# Patient Record
Sex: Female | Born: 1960 | Race: White | Hispanic: No | Smoking: Former smoker
Health system: Southern US, Community
[De-identification: ages and names within clinical notes are randomized; demographics above are authoritative.]

## PROBLEM LIST (undated history)

## (undated) DIAGNOSIS — R002 Palpitations: Secondary | ICD-10-CM

## (undated) DIAGNOSIS — M2550 Pain in unspecified joint: Secondary | ICD-10-CM

## (undated) DIAGNOSIS — M1990 Unspecified osteoarthritis, unspecified site: Secondary | ICD-10-CM

## (undated) DIAGNOSIS — K219 Gastro-esophageal reflux disease without esophagitis: Secondary | ICD-10-CM

## (undated) DIAGNOSIS — D649 Anemia, unspecified: Secondary | ICD-10-CM

## (undated) DIAGNOSIS — R42 Dizziness and giddiness: Secondary | ICD-10-CM

## (undated) DIAGNOSIS — F988 Other specified behavioral and emotional disorders with onset usually occurring in childhood and adolescence: Secondary | ICD-10-CM

## (undated) DIAGNOSIS — F32A Depression, unspecified: Secondary | ICD-10-CM

## (undated) DIAGNOSIS — F329 Major depressive disorder, single episode, unspecified: Secondary | ICD-10-CM

## (undated) DIAGNOSIS — G4730 Sleep apnea, unspecified: Secondary | ICD-10-CM

## (undated) DIAGNOSIS — M549 Dorsalgia, unspecified: Secondary | ICD-10-CM

## (undated) DIAGNOSIS — R0602 Shortness of breath: Secondary | ICD-10-CM

## (undated) DIAGNOSIS — K5900 Constipation, unspecified: Secondary | ICD-10-CM

## (undated) DIAGNOSIS — F419 Anxiety disorder, unspecified: Secondary | ICD-10-CM

## (undated) DIAGNOSIS — I10 Essential (primary) hypertension: Secondary | ICD-10-CM

## (undated) DIAGNOSIS — E119 Type 2 diabetes mellitus without complications: Secondary | ICD-10-CM

## (undated) DIAGNOSIS — R079 Chest pain, unspecified: Secondary | ICD-10-CM

## (undated) DIAGNOSIS — M7989 Other specified soft tissue disorders: Secondary | ICD-10-CM

## (undated) DIAGNOSIS — E785 Hyperlipidemia, unspecified: Secondary | ICD-10-CM

## (undated) DIAGNOSIS — I509 Heart failure, unspecified: Secondary | ICD-10-CM

## (undated) DIAGNOSIS — M797 Fibromyalgia: Secondary | ICD-10-CM

## (undated) HISTORY — DX: Gastro-esophageal reflux disease without esophagitis: K21.9

## (undated) HISTORY — DX: Shortness of breath: R06.02

## (undated) HISTORY — DX: Fibromyalgia: M79.7

## (undated) HISTORY — DX: Essential (primary) hypertension: I10

## (undated) HISTORY — DX: Unspecified osteoarthritis, unspecified site: M19.90

## (undated) HISTORY — DX: Palpitations: R00.2

## (undated) HISTORY — DX: Chest pain, unspecified: R07.9

## (undated) HISTORY — DX: Pain in unspecified joint: M25.50

## (undated) HISTORY — DX: Other specified soft tissue disorders: M79.89

## (undated) HISTORY — DX: Dizziness and giddiness: R42

## (undated) HISTORY — DX: Constipation, unspecified: K59.00

## (undated) HISTORY — DX: Anemia, unspecified: D64.9

## (undated) HISTORY — DX: Anxiety disorder, unspecified: F41.9

## (undated) HISTORY — DX: Other specified behavioral and emotional disorders with onset usually occurring in childhood and adolescence: F98.8

## (undated) HISTORY — DX: Depression, unspecified: F32.A

## (undated) HISTORY — DX: Dorsalgia, unspecified: M54.9

## (undated) HISTORY — DX: Sleep apnea, unspecified: G47.30

## (undated) HISTORY — DX: Type 2 diabetes mellitus without complications: E11.9

## (undated) HISTORY — DX: Hyperlipidemia, unspecified: E78.5

## (undated) HISTORY — PX: OTHER SURGICAL HISTORY: SHX169

---
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
## 2004-07-21 HISTORY — PX: ESOPHAGOGASTRODUODENOSCOPY: SHX1529

## 2011-11-04 HISTORY — PX: COLONOSCOPY: SHX174

## 2013-08-28 DIAGNOSIS — E119 Type 2 diabetes mellitus without complications: Secondary | ICD-10-CM | POA: Insufficient documentation

## 2014-07-12 HISTORY — PX: COLONOSCOPY: SHX174

## 2019-07-06 ENCOUNTER — Other Ambulatory Visit (HOSPITAL_COMMUNITY): Payer: Self-pay | Admitting: Sports Medicine

## 2019-07-06 DIAGNOSIS — Z1231 Encounter for screening mammogram for malignant neoplasm of breast: Secondary | ICD-10-CM

## 2019-07-20 ENCOUNTER — Ambulatory Visit (HOSPITAL_COMMUNITY)
Admission: RE | Admit: 2019-07-20 | Discharge: 2019-07-20 | Disposition: A | Discharge status: Home / Self Care | Payer: Self-pay | Source: Ambulatory Visit | Attending: Sports Medicine | Admitting: Sports Medicine

## 2019-07-20 ENCOUNTER — Other Ambulatory Visit (HOSPITAL_COMMUNITY): Payer: Self-pay | Admitting: Sports Medicine

## 2019-07-20 ENCOUNTER — Other Ambulatory Visit: Payer: Self-pay

## 2019-07-20 DIAGNOSIS — Z1231 Encounter for screening mammogram for malignant neoplasm of breast: Secondary | ICD-10-CM

## 2019-10-04 ENCOUNTER — Encounter: Payer: Self-pay | Admitting: *Deleted

## 2019-10-05 ENCOUNTER — Other Ambulatory Visit: Payer: Self-pay

## 2019-10-05 ENCOUNTER — Ambulatory Visit (INDEPENDENT_AMBULATORY_CARE_PROVIDER_SITE_OTHER): Payer: Self-pay | Admitting: Cardiology

## 2019-10-05 ENCOUNTER — Encounter: Payer: Self-pay | Admitting: Cardiology

## 2019-10-05 ENCOUNTER — Encounter (INDEPENDENT_AMBULATORY_CARE_PROVIDER_SITE_OTHER): Payer: Self-pay

## 2019-10-05 VITALS — BP 118/76 | HR 63 | Ht 61.0 in | Wt 206.6 lb

## 2019-10-05 DIAGNOSIS — R002 Palpitations: Secondary | ICD-10-CM

## 2019-10-05 NOTE — Patient Instructions (Signed)
Medication Instructions:   Your physician recommends that you continue on your current medications as directed. Please refer to the Current Medication list given to you today.  Labwork:  NONE  Testing/Procedures: Your physician has recommended that you wear an event monitor for 7 days. Event monitors are medical devices that record the heart's electrical activity. Doctors most often [place] these monitors to diagnose arrhythmias. Arrhythmias are problems with the speed or rhythm of the heartbeat. The monitor is a small, portable device. You can wear one while you do your normal daily activities. This is usually used to diagnose what is causing palpitations/syncope (passing out). [employer] will send this monitor to your house.  Follow-Up:  Your physician recommends that you schedule a follow-up appointment in: pending.  Any Other Special Instructions Will Be Listed Below (If Applicable).  If you need a refill on your cardiac medications before your next appointment, please call your pharmacy.

## 2019-10-05 NOTE — Progress Notes (Signed)
Cardiology Office Note  Date: 10/05/2019   ID: Sarah Porter, DOB 09/19/1960, MRN [id number]  PCP:  [facility]  Consulting Cardiologist:  [name], MD Electrophysiologist:  None   Chief Complaint  Patient presents with  . Palpitations    History of Present Illness: Sarah Porter is a 59 y.o. female referred for cardiology consultation by Dr. Dahlia Client with the [facility] for the evaluation of palpitations.  She states that back in December she experienced an episode of palpitations while she was in the grocery store, thought that it may have been related to anxiety, however continued to have the symptoms in other settings and was initially placed on atenolol by her PCP.  She reports having leg swelling on that medication and was switched to Toprol-XL, also given a limited course of diuretics.  Her palpitations have been better, but still present on a daily basis.  Not associated with syncope, no definite precipitant, seems to notice them more when she is still, not with activity.  She has had brief palpitations in the past, but not with this frequency.  She stopped Adderall a few weeks ago.  I reviewed her medications which are outlined below.  I personally reviewed her ECG today which shows normal sinus rhythm with small R' in V1, overall normal.  Past Medical History:  Diagnosis Date  . ADD (attention deficit disorder)   . Anxiety   . Depression   . Essential hypertension   . GERD (gastroesophageal reflux disease)   . Hyperlipidemia   . Type 2 diabetes mellitus (HCC)     Past Surgical History:  Procedure Laterality Date  . BILATERAL KNEE REPAIRS    . CESAREAN SECTION     x 2    Current Outpatient Medications  Medication Sig Dispense Refill  . amphetamine-dextroamphetamine (ADDERALL) 20 MG tablet Take 20 mg by mouth daily.    . ARIPiprazole (ABILIFY) 15 MG tablet Take 15 mg by mouth daily.    Marland Kitchen ascorbic acid (VITAMIN C) 500  MG tablet Take 500 mg by mouth daily.    Marland Kitchen atorvastatin (LIPITOR) 40 MG tablet Take 40 mg by mouth daily.    . cholecalciferol (VITAMIN D3) 25 MCG (1000 UNIT) tablet Take 1,000 Units by mouth daily.    . DULoxetine (CYMBALTA) 60 MG capsule Take 60 mg by mouth daily.    . furosemide (LASIX) 20 MG tablet Take 20 mg by mouth daily.    Marland Kitchen imipramine (TOFRANIL) 10 MG tablet Take 10 mg by mouth at bedtime.    . Magnesium 250 MG TABS Take 1 tablet by mouth daily.     . metFORMIN (GLUCOPHAGE) 500 MG tablet Take 500 mg by mouth daily with breakfast.    . metoprolol succinate (TOPROL-XL) 25 MG 24 hr tablet Take 25 mg by mouth daily.     No current facility-administered medications for this visit.   Allergies:  Patient has no known allergies.   Social History: The patient  reports that she quit smoking about 20 years ago. She has never used smokeless tobacco. She reports that she does not drink alcohol or use drugs.   Family History: The patient's family history includes AAA (abdominal aortic aneurysm) in her mother.   ROS:  Please see the history of present illness. Otherwise, complete review of systems is positive for none.  All other systems are reviewed and negative.   Physical Exam: VS:  BP 118/76   Pulse 63   Ht 5'  1" (1.549 m)   Wt 206 lb 9.6 oz (93.7 kg)   SpO2 96%   BMI 39.04 kg/m , BMI Body mass index is 39.04 kg/m.  Wt Readings from Last 3 Encounters:  10/05/19 206 lb 9.6 oz (93.7 kg)  09/19/19 202 lb (91.6 kg)    General: Patient appears comfortable at rest. HEENT: Conjunctiva and lids normal, wearing a mask. Neck: Supple, no elevated JVP or carotid bruits, no thyromegaly. Lungs: Clear to auscultation, nonlabored breathing at rest. Cardiac: Regular rate and rhythm with rare ectopy, no S3 or significant systolic murmur, no pericardial rub. Abdomen: Soft, nontender, bowel sounds present. Extremities: No pitting edema, distal pulses 2+. Skin: Warm and dry. Musculoskeletal: No  kyphosis. Neuropsychiatric: Alert and oriented x3, affect grossly appropriate.  ECG:  No prior tracing available for review.  Recent Labwork:  No lab work available for review.  Other Studies Reviewed Today:  No prior cardiac testing.  Assessment and Plan:  Palpitations as outlined above.  No obvious precipitant or association with syncope.  Baseline ECG is essentially normal.  She stopped Adderall (a potential contributor) a few weeks ago.  Now on Toprol-XL and with fewer palpitations still present on a daily basis.  Her leg swelling has resolved.  Plan is to obtain a 7-day Zio patch for further investigation.  No changes to present medications.  Medication Adjustments/Labs and Tests Ordered: Current medicines are reviewed at length with the patient today.  Concerns regarding medicines are outlined above.   Tests Ordered: Orders Placed This Encounter  Procedures  . LONG TERM MONITOR (3-14 DAYS)  . EKG 12-Lead    Medication Changes: No orders of the defined types were placed in this encounter.   Disposition:  Follow up test results.  Signed, Satira Sark, MD, [facility] 10/05/2019 3:41 PM    [facility] [place], [place], [place] [address] Phone: [phone]; Fax: [phone]

## 2019-10-10 ENCOUNTER — Ambulatory Visit (INDEPENDENT_AMBULATORY_CARE_PROVIDER_SITE_OTHER): Payer: Self-pay

## 2019-10-10 DIAGNOSIS — R002 Palpitations: Secondary | ICD-10-CM

## 2019-10-25 ENCOUNTER — Telehealth: Payer: Self-pay | Admitting: *Deleted

## 2019-10-25 NOTE — Telephone Encounter (Signed)
Patient informed and verbalized understanding of plan. 

## 2019-10-25 NOTE — Telephone Encounter (Signed)
-----   Message from Jonelle Sidle, MD sent at 10/23/2019 10:43 AM EST ----- Results reviewed.  Please let her know that the cardiac monitor was reassuring.  There were rare PACs and PVCs with a very brief burst of SVT of only 6 beats, but these did not necessarily associate with symptoms and are overall benign.  Importantly, she did not have any sustained arrhythmias or pauses that would require further cardiac work-up.  No additional cardiac testing is planned at this time.  Beta-blocker therapy can be continued by PCP as this does help with palpitations.  Keep follow-up with PCP.

## 2019-10-25 NOTE — Telephone Encounter (Signed)
Patient informed. Copy sent to PCP Reported that PCP increased adderall 20 mg to TID. Patient wants to make sure its okay to take with beta blocker.

## 2019-10-25 NOTE — Telephone Encounter (Signed)
Ok for her to take both; Adderall can increase BP and HR, diminishing the effects of her beta blocker. Would advise her to monitor both BP and HR, could consider increasing Toprol dose if needed.

## 2020-05-08 ENCOUNTER — Ambulatory Visit (INDEPENDENT_AMBULATORY_CARE_PROVIDER_SITE_OTHER): Payer: Self-pay

## 2020-05-08 ENCOUNTER — Other Ambulatory Visit: Payer: Self-pay

## 2020-05-08 ENCOUNTER — Encounter: Payer: Self-pay | Admitting: Orthopaedic Surgery

## 2020-05-08 ENCOUNTER — Ambulatory Visit (INDEPENDENT_AMBULATORY_CARE_PROVIDER_SITE_OTHER): Payer: Self-pay | Admitting: Orthopaedic Surgery

## 2020-05-08 VITALS — Ht 61.0 in | Wt 216.0 lb

## 2020-05-08 DIAGNOSIS — M545 Low back pain, unspecified: Secondary | ICD-10-CM | POA: Insufficient documentation

## 2020-05-08 DIAGNOSIS — M5442 Lumbago with sciatica, left side: Secondary | ICD-10-CM

## 2020-05-08 DIAGNOSIS — G8929 Other chronic pain: Secondary | ICD-10-CM

## 2020-05-08 DIAGNOSIS — M25571 Pain in right ankle and joints of right foot: Secondary | ICD-10-CM
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
## 2020-05-21 ENCOUNTER — Ambulatory Visit (HOSPITAL_COMMUNITY)
Admission: RE | Admit: 2020-05-21 | Discharge: 2020-05-21 | Disposition: A | Discharge status: Home / Self Care | Payer: Self-pay | Source: Ambulatory Visit | Attending: Orthopaedic Surgery | Admitting: Orthopaedic Surgery

## 2020-05-21 ENCOUNTER — Other Ambulatory Visit: Payer: Self-pay

## 2020-05-21 DIAGNOSIS — G8929 Other chronic pain: Secondary | ICD-10-CM | POA: Insufficient documentation

## 2020-05-21 DIAGNOSIS — M545 Low back pain, unspecified: Secondary | ICD-10-CM

## 2020-05-21 DIAGNOSIS — M25571 Pain in right ankle and joints of right foot: Secondary | ICD-10-CM

## 2020-05-22 ENCOUNTER — Encounter: Payer: Self-pay | Admitting: Orthopedic Surgery

## 2020-05-22 ENCOUNTER — Ambulatory Visit (INDEPENDENT_AMBULATORY_CARE_PROVIDER_SITE_OTHER): Payer: Self-pay | Admitting: Orthopedic Surgery

## 2020-05-22 ENCOUNTER — Ambulatory Visit: Payer: Self-pay | Admitting: Orthopaedic Surgery

## 2020-05-22 DIAGNOSIS — M5126 Other intervertebral disc displacement, lumbar region: Secondary | ICD-10-CM

## 2020-05-22 DIAGNOSIS — M19071 Primary osteoarthritis, right ankle and foot: Secondary | ICD-10-CM

## 2020-05-22 DIAGNOSIS — M25571 Pain in right ankle and joints of right foot: Secondary | ICD-10-CM

## 2020-05-22 DIAGNOSIS — M76821 Posterior tibial tendinitis, right leg: Secondary | ICD-10-CM | POA: Insufficient documentation

## 2020-05-22 DIAGNOSIS — G8929 Other chronic pain: Secondary | ICD-10-CM

## 2020-05-22 DIAGNOSIS — M5442 Lumbago with sciatica, left side: Secondary | ICD-10-CM
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
## 2020-07-03 ENCOUNTER — Other Ambulatory Visit: Payer: Self-pay

## 2020-07-03 ENCOUNTER — Encounter: Payer: Self-pay | Admitting: Orthopaedic Surgery

## 2020-07-03 ENCOUNTER — Ambulatory Visit (INDEPENDENT_AMBULATORY_CARE_PROVIDER_SITE_OTHER): Payer: Self-pay | Admitting: Orthopaedic Surgery

## 2020-07-03 VITALS — Ht 61.0 in | Wt 216.0 lb

## 2020-07-03 DIAGNOSIS — M76821 Posterior tibial tendinitis, right leg: Secondary | ICD-10-CM

## 2020-07-03 DIAGNOSIS — M5442 Lumbago with sciatica, left side: Secondary | ICD-10-CM

## 2020-07-03 DIAGNOSIS — G8929 Other chronic pain: Secondary | ICD-10-CM

## 2020-07-03 NOTE — Progress Notes (Signed)
Office Visit Note   Patient: Sarah Porter           Date of Birth: 04/28/1961           MRN: [id number] Visit Date: 07/03/2020              Requested by: Waldon Reining, MD [address] [place],  [place] [address] PCP: Waldon Reining, MD   Assessment & Plan: Visit Diagnoses:  1. Tibialis posterior tendinitis, right   2. Chronic left-sided low back pain with left-sided sciatica   3. Morbid (severe) obesity due to excess calories [facility])     Plan: Mrs. Schnitzer has been followed for the problem referable to her right ankle and low back pain as previously outlined. She initially is injured her ankle and then because of her awkward gait and aggravated her back pain. We obtained x-rays and MRI scan of both her back and her right ankle. MRI scan of the lumbar spine revealed mild bilateral L3-4 and right L4-5 neural foraminal narrowing without significant spinal canal narrowing. She had right L4-5 foraminal protrusion grazing the exiting left for nerve root. She is really not having much trouble on the right. She also had an MRI scan of her right ankle demonstrating moderate tendinosis and partial tearing of the posterior tibialis tendon behind the medial malleolus without a full-thickness tear or retraction. There was also marrow edema in the talar head and neck likely reactive but without focal osteochondral lesion. There was mild calcaneocuboid and talonavicular degenerative changes he notes that her ankle is improving she does have inserts in an ankle support. Her back pain is still a problem. She is going to the chiropractor for dry needling and going to the gym for exercises. She is also trying to "lose weight". Her primary care physician recently placed her on prednisone. She also relates that she does not have any insurance is trying to minimize her expenses. I thought she might be a good candidate for either facet injections or an epidural steroid but she wanted to wait and give the above  modalities "more time".. I will plan to see her back at any time. I am glad that she her ankle is improving she is aware that she does have arthritis and I think that is the majority of her pain. Hopefully her back will improve if she does the exercises and lose weight  Follow-Up Instructions: Return if symptoms worsen or fail to improve.   Orders:  No orders of the defined types were placed in this encounter.  No orders of the defined types were placed in this encounter.     Procedures: No procedures performed   Clinical Data: No additional findings.   Subjective: Chief Complaint  Patient presents with  . Lower Back - Follow-up  . Right Ankle - Follow-up  Patient presents today for a seven week follow up on her lower back and right ankle. She was given arch support and inserts at her last visit. She said that she is "trying" to wear the inserts. She does wear her ASO brace while at the gym. She said that she has noticed some improvement in her ankle. Her back however is still "not good". She said that she has been working with a Psychologist, educational at [employer] and doing exercises, but not getting better. She has pain that radiates down her left leg into the knee. Her back pain is worse at night when she lays down, and does wake her at night.  No numbness or tingling in her lower extremities. She takes Ibuprofen or Tylenol for pain relief.   HPI  Review of Systems   Objective: Vital Signs: Ht 5\' 1"  (1.549 m)   Wt 216 lb (98 kg)   BMI 40.81 kg/m   Physical Exam Constitutional:      Appearance: She is well-developed.  Eyes:     Pupils: Pupils are equal, round, and reactive to light.  Pulmonary:     Effort: Pulmonary effort is normal.  Skin:    General: Skin is warm and dry.  Neurological:     Mental Status: She is alert and oriented to person, place, and time.  Psychiatric:        Behavior: Behavior normal.     Ortho Exam awake alert and oriented x3. Comfortable sitting leg raise  negative bilaterally. No pain with range of motion of either knee and hip or knee. Does have some areas of percussible tenderness of the lower lumbar spine. Right ankle with some mild tenderness behind the medial malleolus. There were some areas of tenderness over the dorsum of her midfoot probably consistent with the marrow edema and arthritic changes by x-ray. Does pronate and does have a decreased arch. There is some minimal discomfort behind the medial malleolus consistent with the posterior tibial tendinitis. Good capillary refill to the toes. Motor exam intact  Specialty Comments:  No specialty comments available.  Imaging: No results found.   PMFS History: Patient Active Problem List   Diagnosis Date Noted  . Tibialis posterior tendinitis, right 05/22/2020  . Herniated nucleus pulposus, L4-5 right 05/22/2020  . Arthritis of right ankle 05/22/2020  . Pain in right ankle and joints of right foot 05/08/2020  . Low back pain 05/08/2020  . Morbid (severe) obesity due to excess calories (HCC) 05/08/2020   Past Medical History:  Diagnosis Date  . ADD (attention deficit disorder)   . Anxiety   . Depression   . Essential hypertension   . GERD (gastroesophageal reflux disease)   . Hyperlipidemia   . Type 2 diabetes mellitus (HCC)     Family History  Problem Relation Age of Onset  . AAA (abdominal aortic aneurysm) Mother     Past Surgical History:  Procedure Laterality Date  . BILATERAL KNEE REPAIRS    . CESAREAN SECTION     x 2   Social History   Occupational History  . Not on file  Tobacco Use  . Smoking status: Former Smoker    Quit date: 09/01/1999    Years since quitting: 20.8  . Smokeless tobacco: Never Used  . Tobacco comment: smoked for approx 20 years   Substance and Sexual Activity  . Alcohol use: Never  . Drug use: Never  . Sexual activity: Not on file

## 2020-09-18 ENCOUNTER — Other Ambulatory Visit (HOSPITAL_COMMUNITY): Payer: Self-pay | Admitting: Sports Medicine

## 2020-09-18 DIAGNOSIS — Z1231 Encounter for screening mammogram for malignant neoplasm of breast: Secondary | ICD-10-CM

## 2020-10-17 ENCOUNTER — Telehealth: Payer: Self-pay

## 2020-10-17 NOTE — Telephone Encounter (Signed)
I called [facility] and they are going to re fax it.

## 2020-10-17 NOTE — Telephone Encounter (Signed)
Lulu Riding called from [facility] to check on referral status. Please call her if she needs to resend refferal [phone] ext [phone].

## 2020-10-23 ENCOUNTER — Encounter: Payer: Self-pay | Admitting: Internal Medicine

## 2020-11-04 ENCOUNTER — Ambulatory Visit (HOSPITAL_COMMUNITY): Payer: Self-pay

## 2020-12-04 ENCOUNTER — Ambulatory Visit: Payer: Self-pay | Admitting: Nurse Practitioner

## 2021-03-05 ENCOUNTER — Encounter: Payer: Self-pay | Admitting: Gastroenterology

## 2021-03-05 ENCOUNTER — Telehealth: Payer: Self-pay | Admitting: Internal Medicine

## 2021-03-05 NOTE — Telephone Encounter (Signed)
Patient called asking if Weyman Pedro sent a new referral.

## 2021-03-05 NOTE — Telephone Encounter (Signed)
Referral received, OV made, letter mailed.

## 2021-03-12 ENCOUNTER — Encounter: Payer: Self-pay | Admitting: Orthopaedic Surgery

## 2021-03-12 ENCOUNTER — Ambulatory Visit (INDEPENDENT_AMBULATORY_CARE_PROVIDER_SITE_OTHER): Payer: 59 | Admitting: Orthopaedic Surgery

## 2021-03-12 ENCOUNTER — Other Ambulatory Visit: Payer: Self-pay

## 2021-03-12 VITALS — Ht 61.0 in | Wt 216.0 lb

## 2021-03-12 DIAGNOSIS — M25571 Pain in right ankle and joints of right foot: Secondary | ICD-10-CM

## 2021-03-12 DIAGNOSIS — M5442 Lumbago with sciatica, left side: Secondary | ICD-10-CM

## 2021-03-12 DIAGNOSIS — G8929 Other chronic pain: Secondary | ICD-10-CM | POA: Diagnosis not present

## 2021-03-12 DIAGNOSIS — M76821 Posterior tibial tendinitis, right leg: Secondary | ICD-10-CM | POA: Diagnosis not present

## 2021-03-12 NOTE — Progress Notes (Signed)
Office Visit Note   Patient: Sarah Porter           Date of Birth: November 27, 1960           MRN: [id number] Visit Date: 03/12/2021              Requested by: Waldon Reining, MD [address] [place],  [place] [address] PCP: Waldon Reining, MD   Assessment & Plan: Visit Diagnoses:  1. Chronic left-sided low back pain with left-sided sciatica   2. Tibialis posterior tendinitis, right   3. Pain in right ankle and joints of right foot     Plan: Ms. Breeland has been seen in the past for problems referable to her low back and right ankle.  She had an MRI scan of her lumbar spine in September 2021 demonstrating mild bilateral L3-4 and right L4-5 neuroforaminal narrowing but no significant spinal canal narrowing.  There was a right L4-5 foraminal protrusion grazing the exiting right L4 nerve root.  Pain is been in her back and in her left leg.  She has had a course of physical therapy and even dry needling and still having a problem particularly at night.  She has not had any numbness or tingling.  She denies any bowel or bladder changes.  She is not experiencing much trouble on the right. She also had an MRI scan of her right ankle in September 2021 demonstrating moderate tendinosis and partial tearing of the posterior tibialis tendon.  There was no full-thickness tendon tear or retraction.  There was marrow edema in the talar head and neck likely reactive or stress mediated.  No focal osteochondral lesion.  There was mild calcaneocuboid and talonavicular degenerative arthritis.  The additional ankle tendons and ligaments appeared to be intact.  I am going to place her in an equalizer boot on the right and see how she does over the next month.  I suspect her pain is a combination of the posterior tibial tendon partial tear and the arthritis.  I would like Dr. Alvester Morin to evaluate her back.  She now has insurance and wishes to pursue further treatment  Follow-Up Instructions: Return in about 1 month  (around 04/12/2021).   Orders:  Orders Placed This Encounter  Procedures   Ambulatory referral to Physical Medicine Rehab   No orders of the defined types were placed in this encounter.     Procedures: No procedures performed   Clinical Data: No additional findings.   Subjective: Chief Complaint  Patient presents with   Right Ankle - Pain   Lower Back - Pain  Patient presents today for right ankle and lower back pain. She said that she has constant pain in her right ankle, along with stiffness and numbness. Her lower back is located on the left and does radiate into her legs. She has difficulty sleeping at night. She takes over the counter medicines for pain, but they do not help.  Has had prior therapy and even dry needling for her back and even her ankle.  Has had prior MRI scans of her back and her ankle which were outlined above HPI  Review of Systems   Objective: Vital Signs: Ht 5\' 1"  (1.549 m)   Wt 216 lb (98 kg)   BMI 40.81 kg/m   Physical Exam Constitutional:      Appearance: She is well-developed.  Eyes:     Pupils: Pupils are equal, round, and reactive to light.  Pulmonary:     Effort:  Pulmonary effort is normal.  Skin:    General: Skin is warm and dry.  Neurological:     Mental Status: She is alert and oriented to person, place, and time.  Psychiatric:        Behavior: Behavior normal.    Ortho Exam awake alert and oriented x3.  Comfortable sitting.  Straight leg raise negative.  No pain about either knee.  Painless range of motion both hips.  No percussible back pain.  Had mild nonpitting edema both ankles.  The right ankle there was some tenderness behind the medial malleolus consistent with her posterior tibial tendinitis but it was not functional.  Did have some pain along the anterior aspect of her ankle consistent with the arthritis noted by plain film.  Normal sensation  Specialty Comments:  No specialty comments available.  Imaging: No  results found.   PMFS History: Patient Active Problem List   Diagnosis Date Noted   Tibialis posterior tendinitis, right 05/22/2020   Herniated nucleus pulposus, L4-5 right 05/22/2020   Arthritis of right ankle 05/22/2020   Pain in right ankle and joints of right foot 05/08/2020   Low back pain 05/08/2020   Morbid (severe) obesity due to excess calories (HCC) 05/08/2020   Past Medical History:  Diagnosis Date   ADD (attention deficit disorder)    Anxiety    Depression    Essential hypertension    GERD (gastroesophageal reflux disease)    Hyperlipidemia    Type 2 diabetes mellitus (HCC)     Family History  Problem Relation Age of Onset   AAA (abdominal aortic aneurysm) Mother     Past Surgical History:  Procedure Laterality Date   BILATERAL KNEE REPAIRS     CESAREAN SECTION     x 2   Social History   Occupational History   Not on file  Tobacco Use   Smoking status: Former    Pack years: 0.00    Types: Cigarettes    Quit date: 09/01/1999    Years since quitting: 21.5   Smokeless tobacco: Never   Tobacco comments:    smoked for approx 20 years   Substance and Sexual Activity   Alcohol use: Never   Drug use: Never   Sexual activity: Not on file

## 2021-03-19 ENCOUNTER — Encounter: Payer: Self-pay | Admitting: Physical Medicine and Rehabilitation

## 2021-03-19 ENCOUNTER — Other Ambulatory Visit: Payer: Self-pay

## 2021-03-19 ENCOUNTER — Ambulatory Visit (INDEPENDENT_AMBULATORY_CARE_PROVIDER_SITE_OTHER): Payer: 59 | Admitting: Physical Medicine and Rehabilitation

## 2021-03-19 VITALS — BP 136/81 | HR 67

## 2021-03-19 DIAGNOSIS — M5416 Radiculopathy, lumbar region: Secondary | ICD-10-CM | POA: Diagnosis not present

## 2021-03-19 DIAGNOSIS — M25571 Pain in right ankle and joints of right foot: Secondary | ICD-10-CM

## 2021-03-19 DIAGNOSIS — M5116 Intervertebral disc disorders with radiculopathy, lumbar region: Secondary | ICD-10-CM | POA: Diagnosis not present

## 2021-03-19 DIAGNOSIS — G8929 Other chronic pain: Secondary | ICD-10-CM

## 2021-03-19 DIAGNOSIS — M5442 Lumbago with sciatica, left side: Secondary | ICD-10-CM | POA: Diagnosis not present

## 2021-03-19 NOTE — Progress Notes (Signed)
Pt state lower back pain that travels down her left knee. Pt state sitting, standing and walking for a long period of time makes the pain worse. Pt state she has to lay down to ease her pain. Pt state she takes over the counter pain meds to help ease her pain.    Numeric Pain Rating Scale and Functional Assessment Average Pain 9 Pain Right Now 7 My pain is constant, burning, dull, tingling, and aching Pain is worse with: walking, sitting, standing, and some activites Pain improves with: rest and medication   In the last MONTH (on 0-10 scale) has pain interfered with the following?  1. General activity like being  able to carry out your everyday physical activities such as walking, climbing stairs, carrying groceries, or moving a chair?  Rating(8)  2. Relation with others like being able to carry out your usual social activities and roles such as  activities at home, at work and in your community. Rating(9)  3. Enjoyment of life such that you have  been bothered by emotional problems such as feeling anxious, depressed or irritable?  Rating(8)

## 2021-03-19 NOTE — Progress Notes (Addendum)
Sarah Porter - 60 y.o. female MRN [id number]  Date of birth: 08/31/1961  Office Visit Note: Visit Date: 03/19/2021 PCP: Waldon Reining, MD Referred by: Waldon Reining, MD  Subjective: Chief Complaint  Patient presents with   Lower Back - Pain   Left Knee - Pain   HPI: Sarah Porter is a 60 y.o. female who comes in today per the request of Dr. Norlene Campbell for evaluation of chronic, worsening and severe left lower back pain radiating to buttock and anterolateral knee.  Patient reports pain has been chronic for 1 year, describes pain as an aching, tingling, and pulling sensation. Rates as 7 out of 10 at present.  Patient reports pain worsens with sitting and prolonged standing.  Patient states some relief of pain with Tylenol, ibuprofen, stretching exercises and use of heating pad at home.  Patient's lumbar MRI from 2021 exhibits bilateral L3-L4 and right L4-L5 foraminal narrowing, no evidence of spinal canal narrowing.There is also a right L4-L5 foraminal protrusion noted. Patient is currently going to chiropractor and has had several sessions of dry needling performed with minimal relief of pain. Patient is currently seeing Dr. Norlene Campbell for right posterior tibial tendon partial tear from previous ankle injury, but states she is unable to wear equalizer boot due to increased left sided lower back pain.  Patient denies focal weakness. Patient denies any recent falls or trauma.  Review of Systems  Musculoskeletal:  Positive for back pain.  Neurological:  Positive for tingling and sensory change. Negative for focal weakness.       Pt reports numbness and tingling sensation to left anterolateral leg just past knee.  All other systems reviewed and are negative. Otherwise per HPI.  Assessment & Plan: Visit Diagnoses:    ICD-10-CM   1. Chronic left-sided low back pain with left-sided sciatica  M54.42    G89.29     2. Lumbar radiculopathy  M54.16     3. Intervertebral disc disorders  with radiculopathy, lumbar region  M51.16     4. Pain in right ankle and joints of right foot  M25.571        Plan: Findings:  Chronic, worsening and severe left sided lumbar radiculitis.  Patient continues to have excruciating pain to her left lower back that radiates down her buttock to anterolateral knee, which we believe could be consistent with L4 and L5 nerve distribution. We reviewed lumbar MRI imaging and report in detail, right L4-L5 foraminal protrusion noted, however there is also left lateral recess narrowing noted at this same level and below that is not dictated on imaging report.  Patient does have transitional segment, which could make the dermatomes appear different. Patient is finding it difficult to perform tasks of daily living due to increased pain after being on her feet for prolonged periods of time.  Despite good conservative therapies such as home exercise program, application of heat, and dry needling with chiropractor she continues to have severe pain.  Patient is also finding it difficult to wear right foot equalizer boot as it seems to aggravate her back pain. Lumbar MRI reviewed with patient today using imaging and spine model. Patient's plan of care discussed in detail and we believe the neck step is to perform a diagnostic and hopefully therapeutic left L4-L5 transforaminal epidural steroid injection under fluoroscopic guidance. Patient instructed to follow-up with Dr. Norlene Campbell for continued issues with right ankle pain. No red flag symptoms noted.  Current medication management is not beneficial in increasing patient's  functional status.  Please note that procedures are done as part of a comprehensive orthopedic and pain management program with access to in-house orthopedics, spine surgery and physical therapy as well as access to [facility] biopsychosocial counseling if needed.     Meds & Orders: No orders of the defined types were placed in this  encounter.  No orders of the defined types were placed in this encounter.   Follow-up: Return in about 1 week (around 03/26/2021) for Left L4-L5 transforaminal epidural steroid injection.   Procedures: No procedures performed      Clinical History: EXAM: MRI LUMBAR SPINE WITHOUT CONTRAST   TECHNIQUE: Multiplanar, multisequence MR imaging of the lumbar spine was performed. No intravenous contrast was administered.   COMPARISON:  None.   FINDINGS: Segmentation:  Standard.   Alignment:  Normal.   Vertebrae: Normal bone marrow signal intensity. Scattered T1/T2 hyperintense foci may reflect hemangiomata versus focal fat.   Conus medullaris and cauda equina: Conus extends to the L2 level. Conus and cauda equina appear normal.   Disc levels: Multilevel desiccation and mild disc space loss.   L1-2: No significant disc bulge, spinal canal or neural foraminal narrowing.   L2-3: Mild disc bulge. Left extraforaminal annular fissuring. Bilateral facet degenerative spurring. No significant spinal canal or neural foraminal narrowing.   L3-4: Disc bulge, mild ligamentum flavum and bilateral facet hypertrophy. Mild bilateral neural foraminal narrowing. Patent spinal canal.   L4-5: Disc bulge with superimposed right foraminal protrusion grazing the exiting right L4 nerve root. Bilateral facet hypertrophy. Patent spinal canal and left neural foramen. Mild right neural foraminal narrowing.   L5-S1: No significant disc bulge, spinal canal or neural foraminal narrowing.   Paraspinal and other soft tissues: Negative.   IMPRESSION: Mild bilateral L3-4 and right L4-5 neural foraminal narrowing.   No significant spinal canal narrowing.   Right L4-5 foraminal protrusion grazing the exiting right L4 nerve root.     Electronically Signed   By: Stana Bunting M.D.   On: 05/21/2020 10:19   She reports that she quit smoking about 21 years ago. Her smoking use included  cigarettes. She has never used smokeless tobacco. No results for input(s): HGBA1C, LABURIC in the last 8760 hours.  Objective:  VS:  HT:    WT:   BMI:     BP:136/81  HR:67bpm  TEMP: ( )  RESP:  Physical Exam HENT:     Head: Normocephalic.     Right Ear: Tympanic membrane normal.     Left Ear: Tympanic membrane normal.     Nose: Nose normal.     Mouth/Throat:     Mouth: Mucous membranes are moist.  Eyes:     Pupils: Pupils are equal, round, and reactive to light.  Cardiovascular:     Rate and Rhythm: Normal rate.     Pulses: Normal pulses.  Pulmonary:     Effort: Pulmonary effort is normal.  Abdominal:     General: Abdomen is flat. There is no distension.  Musculoskeletal:     Cervical back: Normal range of motion and neck supple.     Comments: Pt is slow to rise from seated position to standing. Dysesthesias noted to L4 and L5 dermatomes. Good lumbar range of motion. Strong distal strength without clonus, no pain upon palpation of greater trochanters. Walks independently, gait steady.   Skin:    General: Skin is warm and dry.     Capillary Refill: Capillary refill takes less than 2 seconds.  Neurological:     General: No focal deficit present.     Mental Status: She is alert.  Psychiatric:        Mood and Affect: Mood normal.    Ortho Exam  Imaging: No results found.  Past Medical/Family/Surgical/Social History: Medications & Allergies reviewed per EMR, new medications updated. Patient Active Problem List   Diagnosis Date Noted   Tibialis posterior tendinitis, right 05/22/2020   Herniated nucleus pulposus, L4-5 right 05/22/2020   Arthritis of right ankle 05/22/2020   Pain in right ankle and joints of right foot 05/08/2020   Low back pain 05/08/2020   Morbid (severe) obesity due to excess calories (HCC) 05/08/2020   Past Medical History:  Diagnosis Date   ADD (attention deficit disorder)    Anxiety    Depression    Essential hypertension    GERD  (gastroesophageal reflux disease)    Hyperlipidemia    Type 2 diabetes mellitus (HCC)    Family History  Problem Relation Age of Onset   AAA (abdominal aortic aneurysm) Mother    Past Surgical History:  Procedure Laterality Date   BILATERAL KNEE REPAIRS     CESAREAN SECTION     x 2   Social History   Occupational History   Not on file  Tobacco Use   Smoking status: Former    Types: Cigarettes    Quit date: 09/01/1999    Years since quitting: 21.5   Smokeless tobacco: Never   Tobacco comments:    smoked for approx 20 years   Substance and Sexual Activity   Alcohol use: Never   Drug use: Never   Sexual activity: Not on file

## 2021-04-01 ENCOUNTER — Telehealth: Payer: Self-pay | Admitting: Physical Medicine and Rehabilitation

## 2021-04-01 NOTE — Telephone Encounter (Signed)
Pt was referred to [facility] for injs per Cleophas Dunker but received her ins denial in the mail and is unsure of what the next steps to do are or if she needs to sch another appt with Cleophas Dunker or Alvester Morin for next steps. The best call number is [phone].

## 2021-04-03 ENCOUNTER — Ambulatory Visit (HOSPITAL_COMMUNITY)
Admission: RE | Admit: 2021-04-03 | Discharge: 2021-04-03 | Disposition: A | Discharge status: Home / Self Care | Payer: 59 | Source: Ambulatory Visit | Attending: Sports Medicine | Admitting: Sports Medicine

## 2021-04-03 ENCOUNTER — Other Ambulatory Visit: Payer: Self-pay

## 2021-04-03 DIAGNOSIS — Z1231 Encounter for screening mammogram for malignant neoplasm of breast: Secondary | ICD-10-CM

## 2021-04-21 ENCOUNTER — Encounter: Payer: Self-pay | Admitting: Physical Medicine and Rehabilitation

## 2021-04-21 ENCOUNTER — Ambulatory Visit: Payer: Self-pay

## 2021-04-21 ENCOUNTER — Other Ambulatory Visit: Payer: Self-pay

## 2021-04-21 ENCOUNTER — Ambulatory Visit (INDEPENDENT_AMBULATORY_CARE_PROVIDER_SITE_OTHER): Payer: 59 | Admitting: Physical Medicine and Rehabilitation

## 2021-04-21 VITALS — BP 132/86 | HR 57

## 2021-04-21 DIAGNOSIS — M5416 Radiculopathy, lumbar region: Secondary | ICD-10-CM | POA: Diagnosis not present

## 2021-04-21 MED ORDER — METHYLPREDNISOLONE ACETATE 80 MG/ML IJ SUSP
80.0000 mg | Freq: Once | INTRAMUSCULAR | Status: AC
  Administered 2021-04-21: 80 mg

## 2021-04-21 NOTE — Patient Instructions (Signed)

## 2021-04-21 NOTE — Progress Notes (Signed)
Mirage Pfefferkorn - 60 y.o. female MRN [id number]  Date of birth: 1961-06-05  Office Visit Note: Visit Date: 04/21/2021 PCP: Waldon Reining, MD Referred by: Waldon Reining, MD  Subjective: Chief Complaint  Patient presents with   Lower Back - Pain   Left Leg - Pain   Left Knee - Pain   HPI:  Genavieve Mangiapane is a 60 y.o. female who comes in today at the request of Ellin Goodie, FNP for planned Left L4-L5 Lumbar Transforaminal epidural steroid injection with fluoroscopic guidance.  The patient has failed conservative care including home exercise, medications, time and activity modification.  This injection will be diagnostic and hopefully therapeutic.  Please see requesting physician notes for further details and justification.   ROS Otherwise per HPI.  Assessment & Plan: Visit Diagnoses:    ICD-10-CM   1. Left lumbar radiculitis  M54.16 XR C-ARM NO REPORT    Epidural Steroid injection    methylPREDNISolone acetate (DEPO-MEDROL) injection 80 mg      Plan: No additional findings.   Meds & Orders:  Meds ordered this encounter  Medications   methylPREDNISolone acetate (DEPO-MEDROL) injection 80 mg    Orders Placed This Encounter  Procedures   XR C-ARM NO REPORT   Epidural Steroid injection    Follow-up: Return in about 2 weeks (around 05/05/2021) for Ellin Goodie, FNP.   Procedures: No procedures performed  Lumbosacral Transforaminal Epidural Steroid Injection - Sub-Pedicular Approach with Fluoroscopic Guidance  Patient: Humna Moorehouse      Date of Birth: 1961/06/23 MRN: [id number] PCP: Waldon Reining, MD      Visit Date: 04/21/2021   Universal Protocol:    Date/Time: 04/21/2021  Consent Given By: the patient  Position: PRONE  Additional Comments: Vital signs were monitored before and after the procedure. Patient was prepped and draped in the usual sterile fashion. The correct patient, procedure, and site was verified.   Injection Procedure Details:    Procedure diagnoses: Left lumbar radiculitis [M54.16]    Meds Administered:  Meds ordered this encounter  Medications   methylPREDNISolone acetate (DEPO-MEDROL) injection 80 mg    Laterality: Left  Location/Site:  L4-L5  Needle:5.0 in., 22 ga.  Short bevel or Quincke spinal needle  Needle Placement: Transforaminal  Findings:    -Comments: Excellent flow of contrast along the nerve, nerve root and into the epidural space.  Procedure Details: After squaring off the end-plates to get a true AP view, the C-arm was positioned so that an oblique view of the foramen as noted above was visualized. The target area is just inferior to the "nose of the scotty dog" or sub pedicular. The soft tissues overlying this structure were infiltrated with 2-3 ml. of 1% Lidocaine without Epinephrine.  The spinal needle was inserted toward the target using a "trajectory" view along the fluoroscope beam.  Under AP and lateral visualization, the needle was advanced so it did not puncture dura and was located close the 6 O'Clock position of the pedical in AP tracterory. Biplanar projections were used to confirm position. Aspiration was confirmed to be negative for CSF and/or blood. A 1-2 ml. volume of Isovue-250 was injected and flow of contrast was noted at each level. Radiographs were obtained for documentation purposes.   After attaining the desired flow of contrast documented above, a 0.5 to 1.0 ml test dose of 0.25% Marcaine was injected into each respective transforaminal space.  The patient was observed for 90 seconds post injection.  After no sensory deficits were reported, and  normal lower extremity motor function was noted,   the above injectate was administered so that equal amounts of the injectate were placed at each foramen (level) into the transforaminal epidural space.   Additional Comments:  The patient tolerated the procedure well Dressing: 2 x 2 sterile gauze and Band-Aid     Post-procedure details: Patient was observed during the procedure. Post-procedure instructions were reviewed.  Patient left the clinic in stable condition.    Clinical History: EXAM: MRI LUMBAR SPINE WITHOUT CONTRAST   TECHNIQUE: Multiplanar, multisequence MR imaging of the lumbar spine was performed. No intravenous contrast was administered.   COMPARISON:  None.   FINDINGS: Segmentation:  Standard.   Alignment:  Normal.   Vertebrae: Normal bone marrow signal intensity. Scattered T1/T2 hyperintense foci may reflect hemangiomata versus focal fat.   Conus medullaris and cauda equina: Conus extends to the L2 level. Conus and cauda equina appear normal.   Disc levels: Multilevel desiccation and mild disc space loss.   L1-2: No significant disc bulge, spinal canal or neural foraminal narrowing.   L2-3: Mild disc bulge. Left extraforaminal annular fissuring. Bilateral facet degenerative spurring. No significant spinal canal or neural foraminal narrowing.   L3-4: Disc bulge, mild ligamentum flavum and bilateral facet hypertrophy. Mild bilateral neural foraminal narrowing. Patent spinal canal.   L4-5: Disc bulge with superimposed right foraminal protrusion grazing the exiting right L4 nerve root. Bilateral facet hypertrophy. Patent spinal canal and left neural foramen. Mild right neural foraminal narrowing.   L5-S1: No significant disc bulge, spinal canal or neural foraminal narrowing.   Paraspinal and other soft tissues: Negative.   IMPRESSION: Mild bilateral L3-4 and right L4-5 neural foraminal narrowing.   No significant spinal canal narrowing.   Right L4-5 foraminal protrusion grazing the exiting right L4 nerve root.     Electronically Signed   By: Stana Bunting M.D.   On: 05/21/2020 10:19     Objective:  VS:  HT:    WT:   BMI:     BP:132/86  HR:(!) 57bpm  TEMP: ( )  RESP:  Physical Exam Vitals and nursing note reviewed.   Constitutional:      General: She is not in acute distress.    Appearance: Normal appearance. She is not ill-appearing.  HENT:     Head: Normocephalic and atraumatic.     Right Ear: External ear normal.     Left Ear: External ear normal.  Eyes:     Extraocular Movements: Extraocular movements intact.  Cardiovascular:     Rate and Rhythm: Normal rate.     Pulses: Normal pulses.  Pulmonary:     Effort: Pulmonary effort is normal. No respiratory distress.  Abdominal:     General: There is no distension.     Palpations: Abdomen is soft.  Musculoskeletal:        General: Tenderness present.     Cervical back: Neck supple.     Right lower leg: No edema.     Left lower leg: No edema.     Comments: Patient has good distal strength with no pain over the greater trochanters.  No clonus or focal weakness.  Skin:    Findings: No erythema, lesion or rash.  Neurological:     General: No focal deficit present.     Mental Status: She is alert and oriented to person, place, and time.     Sensory: No sensory deficit.     Motor: No weakness or abnormal muscle tone.  Coordination: Coordination normal.  Psychiatric:        Mood and Affect: Mood normal.        Behavior: Behavior normal.     Imaging: XR C-ARM NO REPORT  Result Date: 04/21/2021 Please see Notes tab for imaging impression.

## 2021-04-21 NOTE — Progress Notes (Signed)
Pt state lower back pain that travels down her left leg to the knee. Pt state walking and sitting makes the pain worse pain. Pt state she takes over the counter pain meds to help ease her pain.  Numeric Pain Rating Scale and Functional Assessment Average Pain 7   In the last MONTH (on 0-10 scale) has pain interfered with the following?  1. General activity like being  able to carry out your everyday physical activities such as walking, climbing stairs, carrying groceries, or moving a chair?  Rating(9)   +Driver, -BT, -Dye Allergies.

## 2021-04-21 NOTE — Procedures (Signed)
Lumbosacral Transforaminal Epidural Steroid Injection - Sub-Pedicular Approach with Fluoroscopic Guidance  Patient: Sarah Porter      Date of Birth: 30-Jul-1961 MRN: [id number] PCP: Waldon Reining, MD      Visit Date: 04/21/2021   Universal Protocol:    Date/Time: 04/21/2021  Consent Given By: the patient  Position: PRONE  Additional Comments: Vital signs were monitored before and after the procedure. Patient was prepped and draped in the usual sterile fashion. The correct patient, procedure, and site was verified.   Injection Procedure Details:   Procedure diagnoses: Left lumbar radiculitis [M54.16]    Meds Administered:  Meds ordered this encounter  Medications   methylPREDNISolone acetate (DEPO-MEDROL) injection 80 mg    Laterality: Left  Location/Site:  L4-L5  Needle:5.0 in., 22 ga.  Short bevel or Quincke spinal needle  Needle Placement: Transforaminal  Findings:    -Comments: Excellent flow of contrast along the nerve, nerve root and into the epidural space.  Procedure Details: After squaring off the end-plates to get a true AP view, the C-arm was positioned so that an oblique view of the foramen as noted above was visualized. The target area is just inferior to the "nose of the scotty dog" or sub pedicular. The soft tissues overlying this structure were infiltrated with 2-3 ml. of 1% Lidocaine without Epinephrine.  The spinal needle was inserted toward the target using a "trajectory" view along the fluoroscope beam.  Under AP and lateral visualization, the needle was advanced so it did not puncture dura and was located close the 6 O'Clock position of the pedical in AP tracterory. Biplanar projections were used to confirm position. Aspiration was confirmed to be negative for CSF and/or blood. A 1-2 ml. volume of Isovue-250 was injected and flow of contrast was noted at each level. Radiographs were obtained for documentation purposes.   After attaining the desired  flow of contrast documented above, a 0.5 to 1.0 ml test dose of 0.25% Marcaine was injected into each respective transforaminal space.  The patient was observed for 90 seconds post injection.  After no sensory deficits were reported, and normal lower extremity motor function was noted,   the above injectate was administered so that equal amounts of the injectate were placed at each foramen (level) into the transforaminal epidural space.   Additional Comments:  The patient tolerated the procedure well Dressing: 2 x 2 sterile gauze and Band-Aid    Post-procedure details: Patient was observed during the procedure. Post-procedure instructions were reviewed.  Patient left the clinic in stable condition.

## 2021-05-06 ENCOUNTER — Other Ambulatory Visit: Payer: Self-pay

## 2021-05-06 ENCOUNTER — Ambulatory Visit (INDEPENDENT_AMBULATORY_CARE_PROVIDER_SITE_OTHER): Payer: 59 | Admitting: Physical Medicine and Rehabilitation

## 2021-05-06 ENCOUNTER — Encounter: Payer: Self-pay | Admitting: Physical Medicine and Rehabilitation

## 2021-05-06 VITALS — BP 119/74 | HR 65

## 2021-05-06 DIAGNOSIS — M47816 Spondylosis without myelopathy or radiculopathy, lumbar region: Secondary | ICD-10-CM | POA: Diagnosis not present

## 2021-05-06 DIAGNOSIS — M5442 Lumbago with sciatica, left side: Secondary | ICD-10-CM | POA: Diagnosis not present

## 2021-05-06 DIAGNOSIS — M5116 Intervertebral disc disorders with radiculopathy, lumbar region: Secondary | ICD-10-CM | POA: Diagnosis not present

## 2021-05-06 DIAGNOSIS — G8929 Other chronic pain: Secondary | ICD-10-CM | POA: Diagnosis not present

## 2021-05-06 NOTE — Progress Notes (Signed)
Sarah  Landree Porter - 60 y.o. female MRN [id number]  Date of birth: Mar 15, 1961  Office Visit Note: Visit Date: 05/06/2021 PCP: Sarah Reining, MD Referred by: Sarah Reining, MD  Subjective: Chief Complaint  Patient presents with   Lower Back - Pain   HPI: Sarah Porter is a 60 y.o. female who comes in today for evaluation of chronic, worsening and severe left lower back pain radiating to buttock and anterolateral leg/knee. Patient had left L4-L5 transforaminal epidural steroid injection on 04/21/21, now reports significant relief of radicular symptoms, reporting more than 50% pain relief, however she continues to have left lower back pain radiating to buttock. Patient reports pain is exacerbated by prolonged standing and sitting. Currently rates as 7 out of 10 and describes as soreness. Patient reports some pain relief with Tylenol, Ibuprofen and stretching exercises. Patient's lumbar MRI from 2021 exhibits bilateral L3-L4 and right L4-L5 foraminal narrowing, no evidence of spinal canal narrowing. There is also a right L4-L5 foraminal protrusion noted and multi-level facet hypertrophy. Patient reports she is now sleeping better and feels like she can walk without excruciating left leg pain. Patient also reports she recently rode her motorcycle but had to limit her riding time due to left lower back pain. Patient recently started attending water aerobics classes at the [facility]. Patient denies focal weakness, numbness and tingling. Patient denies recent trauma or fall.   Review of Systems  Musculoskeletal:  Positive for back pain.  Neurological:  Negative for tingling, sensory change, focal weakness and weakness.  All other systems reviewed and are negative. Otherwise per HPI.  Assessment & Plan: Visit Diagnoses:    ICD-10-CM   1. Spondylosis without myelopathy or radiculopathy, lumbar region  M47.816     2. Chronic left-sided low back pain with left-sided sciatica  M54.42 Ambulatory referral to  Physical Medicine Rehab   G89.29     3. Intervertebral disc disorders with radiculopathy, lumbar region  M51.16     4. Facet arthropathy, lumbar  M47.816        Plan: Findings:  Chronic, worsening and severe left lower back pain radiating to buttock and anterolateral knee.  These seem to be 2 separate issues 1 of radicular pain from foraminal narrowing and 1 pain from the lower back facet mediated.  Substantial and sustained left anterolateral leg/knee pain relief from recent left L4-L5 transforaminal epidural steroid injection.  Diagnostically the epidural injection has proven that the leg pain is more radicular.  This did not help her back pain however.  Patient continues to have left lower back pain radiating to buttock. Patient's clinical presentation is consistent with facet mediated pain. We discussed patient's lumbar MRI using spine models, we also talked with her about facet joints and associated pain pattern. We believe the next step is to perform diagnostic and hopefully therapeutic left L3-L4 and L4-L5 facet joint injections. Radiofrequency ablation also discussed with patient, which would be a good option for her if she gets significant relief with facet joint injections. Patient also given literature to take come regarding radiofrequency ablation procedure. Patient encouraged to continue conservative therapies and water aerobics as tolerated. We will continue to monitor radicular left leg symptoms, patient instructed to let us know if her pain begins to return. No red flag symptoms noted upon exam today.    Meds & Orders: No orders of the defined types were placed in this encounter.   Orders Placed This Encounter  Procedures   Ambulatory referral to Physical Medicine Rehab  Follow-up: Return in about 1 week (around 05/13/2021) for Left L3-L4 and L4-L5 facet joint injections.   Procedures: No procedures performed      Clinical History: EXAM: MRI LUMBAR SPINE WITHOUT CONTRAST    TECHNIQUE: Multiplanar, multisequence MR imaging of the lumbar spine was performed. No intravenous contrast was administered.   COMPARISON:  None.   FINDINGS: Segmentation:  Standard.   Alignment:  Normal.   Vertebrae: Normal bone marrow signal intensity. Scattered T1/T2 hyperintense foci may reflect hemangiomata versus focal fat.   Conus medullaris and cauda equina: Conus extends to the L2 level. Conus and cauda equina appear normal.   Disc levels: Multilevel desiccation and mild disc space loss.   L1-2: No significant disc bulge, spinal canal or neural foraminal narrowing.   L2-3: Mild disc bulge. Left extraforaminal annular fissuring. Bilateral facet degenerative spurring. No significant spinal canal or neural foraminal narrowing.   L3-4: Disc bulge, mild ligamentum flavum and bilateral facet hypertrophy. Mild bilateral neural foraminal narrowing. Patent spinal canal.   L4-5: Disc bulge with superimposed right foraminal protrusion grazing the exiting right L4 nerve root. Bilateral facet hypertrophy. Patent spinal canal and left neural foramen. Mild right neural foraminal narrowing.   L5-S1: No significant disc bulge, spinal canal or neural foraminal narrowing.   Paraspinal and other soft tissues: Negative.   IMPRESSION: Mild bilateral L3-4 and right L4-5 neural foraminal narrowing.   No significant spinal canal narrowing.   Right L4-5 foraminal protrusion grazing the exiting right L4 nerve root.     Electronically Signed   By: Sarah Porter M.D.   On: 05/21/2020 10:19   She reports that she quit smoking about 21 years ago. Her smoking use included cigarettes. She has never used smokeless tobacco. No results for input(s): HGBA1C, LABURIC in the last 8760 hours.  Objective:  VS:  HT:    WT:   BMI:     BP:119/74  HR:65bpm  TEMP: ( )  RESP:  Physical Exam HENT:     Head: Normocephalic and atraumatic.     Right Ear: Tympanic membrane normal.      Left Ear: Tympanic membrane normal.     Nose: Nose normal.     Mouth/Throat:     Mouth: Mucous membranes are moist.  Eyes:     Pupils: Pupils are equal, round, and reactive to light.  Cardiovascular:     Rate and Rhythm: Normal rate.     Pulses: Normal pulses.  Pulmonary:     Effort: Pulmonary effort is normal.  Abdominal:     General: Abdomen is flat. There is no distension.  Musculoskeletal:        General: Tenderness present.     Cervical back: Normal range of motion and neck supple.     Comments: Pt is slow to rise from seated position to standing. Pain noted upon facet loading. Strong distal strength without clonus, no pain upon palpation of greater trochanters. No pain noted with bilateral hip flexion/extension. Sensation intact bilaterally. Walks independently, gait steady.    Skin:    General: Skin is warm and dry.     Capillary Refill: Capillary refill takes less than 2 seconds.  Neurological:     General: No focal deficit present.     Mental Status: She is alert.    Ortho Exam  Imaging: No results found.  Past Medical/Family/Surgical/Social History: Medications & Allergies reviewed per EMR, new medications updated. Patient Active Problem List   Diagnosis Date Noted   Tibialis posterior tendinitis,  right 05/22/2020   Herniated nucleus pulposus, L4-5 right 05/22/2020   Arthritis of right ankle 05/22/2020   Pain in right ankle and joints of right foot 05/08/2020   Low back pain 05/08/2020   Morbid (severe) obesity due to excess calories (HCC) 05/08/2020   Past Medical History:  Diagnosis Date   ADD (attention deficit disorder)    Anxiety    Depression    Essential hypertension    GERD (gastroesophageal reflux disease)    Hyperlipidemia    Type 2 diabetes mellitus (HCC)    Family History  Problem Relation Age of Onset   AAA (abdominal aortic aneurysm) Mother    Past Surgical History:  Procedure Laterality Date   BILATERAL KNEE REPAIRS     CESAREAN  SECTION     x 2   Social History   Occupational History   Not on file  Tobacco Use   Smoking status: Former    Types: Cigarettes    Quit date: 09/01/1999    Years since quitting: 21.6   Smokeless tobacco: Never   Tobacco comments:    smoked for approx 20 years   Substance and Sexual Activity   Alcohol use: Never   Drug use: Never   Sexual activity: Not on file

## 2021-05-06 NOTE — Progress Notes (Signed)
Pt state lower back pain mostly on her left side. Pt state standing or sitting for a long period of time makes the pain worse. Pt state the pain shoot down her leg but her knee doesn't hurt like it use to. Pt state she take over the counter to help ease her pain. Pt has hx of inj on 04/21/21 pt state it helped but still painful.  Numeric Pain Rating Scale and Functional Assessment Average Pain 9 Pain Right Now 7 My pain is intermittent and aching Pain is worse with: sitting and standing Pain improves with: medication   In the last MONTH (on 0-10 scale) has pain interfered with the following?  1. General activity like being  able to carry out your everyday physical activities such as walking, climbing stairs, carrying groceries, or moving a chair?  Rating(6)  2. Relation with others like being able to carry out your usual social activities and roles such as  activities at home, at work and in your community. Rating(7)  3. Enjoyment of life such that you have  been bothered by emotional problems such as feeling anxious, depressed or irritable?  Rating(8)

## 2021-05-09 ENCOUNTER — Telehealth: Payer: Self-pay | Admitting: Physical Medicine and Rehabilitation

## 2021-05-09 NOTE — Telephone Encounter (Signed)
Pt called returning a call for an appt and she states she'll be busy from 12-3 but any time before or after that she'll be available.  [phone]

## 2021-05-26 ENCOUNTER — Ambulatory Visit (INDEPENDENT_AMBULATORY_CARE_PROVIDER_SITE_OTHER): Payer: 59 | Admitting: Physical Medicine and Rehabilitation

## 2021-05-26 ENCOUNTER — Ambulatory Visit: Payer: Self-pay

## 2021-05-26 ENCOUNTER — Encounter: Payer: Self-pay | Admitting: Physical Medicine and Rehabilitation

## 2021-05-26 ENCOUNTER — Other Ambulatory Visit: Payer: Self-pay

## 2021-05-26 VITALS — BP 119/74 | HR 60

## 2021-05-26 DIAGNOSIS — M47816 Spondylosis without myelopathy or radiculopathy, lumbar region: Secondary | ICD-10-CM | POA: Diagnosis not present

## 2021-05-26 MED ORDER — BUPIVACAINE HCL 0.5 % IJ SOLN
3.0000 mL | Freq: Once | INTRAMUSCULAR | Status: AC
  Administered 2021-05-26: 3 mL

## 2021-05-26 NOTE — Progress Notes (Signed)
Pt state lower back pain. Pt state sitting and standing makes the pain worse. Pt state she takes over the counter pain meds to help ease her pain. Pt has hx of inj on 04/21/21 pt state it helped with the knee pain.  Numeric Pain Rating Scale and Functional Assessment Average Pain 6   In the last MONTH (on 0-10 scale) has pain interfered with the following?  1. General activity like being  able to carry out your everyday physical activities such as walking, climbing stairs, carrying groceries, or moving a chair?  Rating(8)   +Driver, -BT, -Dye Allergies.

## 2021-05-26 NOTE — Procedures (Signed)
Lumbar Diagnostic Facet Joint Nerve Block with Fluoroscopic Guidance   Patient: Sarah Porter      Date of Birth: December 05, 1960 MRN: [id number] PCP: Waldon Reining, MD      Visit Date: 05/26/2021   Universal Protocol:    Date/Time: 09/26/228:24 AM  Consent Given By: the patient  Position: PRONE  Additional Comments: Vital signs were monitored before and after the procedure. Patient was prepped and draped in the usual sterile fashion. The correct patient, procedure, and site was verified.   Injection Procedure Details:   Procedure diagnoses:  1. Spondylosis without myelopathy or radiculopathy, lumbar region      Meds Administered:  Meds ordered this encounter  Medications   bupivacaine (MARCAINE) 0.5 % (with pres) injection 3 mL     Laterality: Bilateral  Location/Site: L3-L4, L2 and L3 medial branches and L4-L5, L3 and L4 medial branches  Needle: 5.0 in., 25 ga.  Short bevel or Quincke spinal needle  Needle Placement: Oblique pedical  Findings:   -Comments: There was excellent flow of contrast along the articular pillars without intravascular flow.  Procedure Details: The fluoroscope beam is vertically oriented in AP and then obliqued 15 to 20 degrees to the ipsilateral side of the desired nerve to achieve the "Scotty dog" appearance.  The skin over the target area of the junction of the superior articulating process and the transverse process (sacral ala if blocking the L5 dorsal rami) was locally anesthetized with a 1 ml volume of 1% Lidocaine without Epinephrine.  The spinal needle was inserted and advanced in a trajectory view down to the target.   After contact with periosteum and negative aspirate for blood and CSF, correct placement without intravascular or epidural spread was confirmed by injecting 0.5 ml. of Isovue-250.  A spot radiograph was obtained of this image.    Next, a 0.5 ml. volume of the injectate described above was injected. The needle was then  redirected to the other facet joint nerves mentioned above if needed.  Prior to the procedure, the patient was given a Pain Diary which was completed for baseline measurements.  After the procedure, the patient rated their pain every 30 minutes and will continue rating at this frequency for a total of 5 hours.  The patient has been asked to complete the Diary and return to [place] by mail, fax or hand delivered as soon as possible.   Additional Comments:  The patient tolerated the procedure well Dressing: 2 x 2 sterile gauze and Band-Aid    Post-procedure details: Patient was observed during the procedure. Post-procedure instructions were reviewed.  Patient left the clinic in stable condition.

## 2021-05-26 NOTE — Progress Notes (Signed)
Sarah Porter - 60 y.o. female MRN [id number]  Date of birth: 02/03/1961  Office Visit Note: Visit Date: 05/26/2021 PCP: Waldon Reining, MD Referred by: Waldon Reining, MD  Subjective: Chief Complaint  Patient presents with   Lower Back - Pain   HPI:  Sarah Porter is a 60 y.o. female who comes in today at the request of Ellin Goodie, FNP for planned Left L3-4 and L4-5 lumbar facet/medial branch block with fluoroscopic guidance.  The patient has failed conservative care including home exercise, medications, time and activity modification.  This injection will be diagnostic and hopefully therapeutic.  Please see requesting physician notes for further details and justification.  Exam has shown concordant pain with facet joint loading.   Please note that while she was scheduled for a left L3-4 and L4-5 she did have bilateral back pain just worse left than right but it is bilateral.  We did complete bilateral medial branch blocks today diagnostically with pain diary.  ROS Otherwise per HPI.  Assessment & Plan: Visit Diagnoses:    ICD-10-CM   1. Spondylosis without myelopathy or radiculopathy, lumbar region  M47.816 XR C-ARM NO REPORT    Facet Injection    bupivacaine (MARCAINE) 0.5 % (with pres) injection 3 mL      Plan: No additional findings.   Meds & Orders:  Meds ordered this encounter  Medications   bupivacaine (MARCAINE) 0.5 % (with pres) injection 3 mL    Orders Placed This Encounter  Procedures   Facet Injection   XR C-ARM NO REPORT    Follow-up: Return for Review Pain Diary.   Procedures: No procedures performed  Lumbar Diagnostic Facet Joint Nerve Block with Fluoroscopic Guidance   Patient: Sarah Porter      Date of Birth: 10-May-1961 MRN: [id number] PCP: Waldon Reining, MD      Visit Date: 05/26/2021   Universal Protocol:    Date/Time: 09/26/228:24 AM  Consent Given By: the patient  Position: PRONE  Additional Comments: Vital signs were monitored  before and after the procedure. Patient was prepped and draped in the usual sterile fashion. The correct patient, procedure, and site was verified.   Injection Procedure Details:   Procedure diagnoses:  1. Spondylosis without myelopathy or radiculopathy, lumbar region      Meds Administered:  Meds ordered this encounter  Medications   bupivacaine (MARCAINE) 0.5 % (with pres) injection 3 mL     Laterality: Bilateral  Location/Site: L3-L4, L2 and L3 medial branches and L4-L5, L3 and L4 medial branches  Needle: 5.0 in., 25 ga.  Short bevel or Quincke spinal needle  Needle Placement: Oblique pedical  Findings:   -Comments: There was excellent flow of contrast along the articular pillars without intravascular flow.  Procedure Details: The fluoroscope beam is vertically oriented in AP and then obliqued 15 to 20 degrees to the ipsilateral side of the desired nerve to achieve the "Scotty dog" appearance.  The skin over the target area of the junction of the superior articulating process and the transverse process (sacral ala if blocking the L5 dorsal rami) was locally anesthetized with a 1 ml volume of 1% Lidocaine without Epinephrine.  The spinal needle was inserted and advanced in a trajectory view down to the target.   After contact with periosteum and negative aspirate for blood and CSF, correct placement without intravascular or epidural spread was confirmed by injecting 0.5 ml. of Isovue-250.  A spot radiograph was obtained of this image.    Next, a 0.5  ml. volume of the injectate described above was injected. The needle was then redirected to the other facet joint nerves mentioned above if needed.  Prior to the procedure, the patient was given a Pain Diary which was completed for baseline measurements.  After the procedure, the patient rated their pain every 30 minutes and will continue rating at this frequency for a total of 5 hours.  The patient has been asked to complete the  Diary and return to [place] by mail, fax or hand delivered as soon as possible.   Additional Comments:  The patient tolerated the procedure well Dressing: 2 x 2 sterile gauze and Band-Aid    Post-procedure details: Patient was observed during the procedure. Post-procedure instructions were reviewed.  Patient left the clinic in stable condition.    Clinical History: EXAM: MRI LUMBAR SPINE WITHOUT CONTRAST   TECHNIQUE: Multiplanar, multisequence MR imaging of the lumbar spine was performed. No intravenous contrast was administered.   COMPARISON:  None.   FINDINGS: Segmentation:  Standard.   Alignment:  Normal.   Vertebrae: Normal bone marrow signal intensity. Scattered T1/T2 hyperintense foci may reflect hemangiomata versus focal fat.   Conus medullaris and cauda equina: Conus extends to the L2 level. Conus and cauda equina appear normal.   Disc levels: Multilevel desiccation and mild disc space loss.   L1-2: No significant disc bulge, spinal canal or neural foraminal narrowing.   L2-3: Mild disc bulge. Left extraforaminal annular fissuring. Bilateral facet degenerative spurring. No significant spinal canal or neural foraminal narrowing.   L3-4: Disc bulge, mild ligamentum flavum and bilateral facet hypertrophy. Mild bilateral neural foraminal narrowing. Patent spinal canal.   L4-5: Disc bulge with superimposed right foraminal protrusion grazing the exiting right L4 nerve root. Bilateral facet hypertrophy. Patent spinal canal and left neural foramen. Mild right neural foraminal narrowing.   L5-S1: No significant disc bulge, spinal canal or neural foraminal narrowing.   Paraspinal and other soft tissues: Negative.   IMPRESSION: Mild bilateral L3-4 and right L4-5 neural foraminal narrowing.   No significant spinal canal narrowing.   Right L4-5 foraminal protrusion grazing the exiting right L4 nerve root.     Electronically Signed   By: Stana Bunting  M.D.   On: 05/21/2020 10:19     Objective:  VS:  HT:    WT:   BMI:     BP:119/74  HR:60bpm  TEMP: ( )  RESP:  Physical Exam Vitals and nursing note reviewed.  Constitutional:      General: She is not in acute distress.    Appearance: Normal appearance. She is not ill-appearing.  HENT:     Head: Normocephalic and atraumatic.     Right Ear: External ear normal.     Left Ear: External ear normal.  Eyes:     Extraocular Movements: Extraocular movements intact.  Cardiovascular:     Rate and Rhythm: Normal rate.     Pulses: Normal pulses.  Pulmonary:     Effort: Pulmonary effort is normal. No respiratory distress.  Abdominal:     General: There is no distension.     Palpations: Abdomen is soft.  Musculoskeletal:        General: Tenderness present.     Cervical back: Neck supple.     Right lower leg: No edema.     Left lower leg: No edema.     Comments: Patient has good distal strength with no pain over the greater trochanters.  No clonus or focal weakness. Patient somewhat  slow to rise from a seated position to full extension.  There is concordant low back pain with facet loading and lumbar spine extension rotation.  There are no definitive trigger points but the patient is somewhat tender across the lower back and PSIS.  There is no pain with hip rotation.   Skin:    Findings: No erythema, lesion or rash.  Neurological:     General: No focal deficit present.     Mental Status: She is alert and oriented to person, place, and time.     Sensory: No sensory deficit.     Motor: No weakness or abnormal muscle tone.     Coordination: Coordination normal.  Psychiatric:        Mood and Affect: Mood normal.        Behavior: Behavior normal.     Imaging: No results found.

## 2021-05-26 NOTE — Patient Instructions (Signed)

## 2021-06-04 ENCOUNTER — Encounter: Payer: Self-pay | Admitting: Orthopaedic Surgery

## 2021-06-04 ENCOUNTER — Ambulatory Visit (INDEPENDENT_AMBULATORY_CARE_PROVIDER_SITE_OTHER): Payer: 59 | Admitting: Orthopaedic Surgery

## 2021-06-04 ENCOUNTER — Other Ambulatory Visit: Payer: Self-pay

## 2021-06-04 DIAGNOSIS — M25571 Pain in right ankle and joints of right foot: Secondary | ICD-10-CM

## 2021-06-04 DIAGNOSIS — M76821 Posterior tibial tendinitis, right leg: Secondary | ICD-10-CM | POA: Diagnosis not present

## 2021-06-04 NOTE — Progress Notes (Signed)
Office Visit Note   Patient: Sarah Porter           Date of Birth: 10-Sep-1960           MRN: [id number] Visit Date: 06/04/2021              Requested by: Waldon Reining, MD [address] [place] [address] [place],  [place] [address] PCP: Waldon Reining, MD   Assessment & Plan: Visit Diagnoses:  1. Tibialis posterior tendinitis, right   2. Pain in right ankle and joints of right foot   3. Morbid (severe) obesity due to excess calories [facility])     Plan: [name] has been followed for 2 issues.  She has had a problem with her back on a chronic basis and was referred to Dr. Alvester Morin.  She was seen last week for some injections and thinks it may have helped.  She has a follow-up appointment with him.  In addition she has had some issues with her right foot.  She had an MRI scan in 2021 revealing some pathology about the posterior tibial tendon without a full-thickness tear and some degenerative changes in the midfoot and even the subtalar joint.  Most of her problem appears to be in the dorsum of her foot which appears to be consistent with the arthritis.  She no longer is having a problem with her posterior tibial tendon but she does pronate.  She had difficulty wearing the equalizer boot as it bothered her back.  I will apply a full innersoles to her foot to help with her pronation and then asked Dr. Alvester Morin if he would consider injecting of the midfoot arthritic areas.  She will also follow-up with him for her back.  She is in the midst of trying to lose weight and exercise  Follow-Up Instructions: Return if symptoms worsen or fail to improve.   Orders:  No orders of the defined types were placed in this encounter.  No orders of the defined types were placed in this encounter.     Procedures: No procedures performed   Clinical Data: No additional findings.   Subjective: Chief Complaint  Patient presents with   Right Foot - Follow-up, Pain  Patient presents today for a follow up on her right  foot. She continues to have some pain. She was unable to wear the boot because it was "throwing her back out". She wears regular shoes. She takes over the counter medicine daily. Her pain is a level 7.  Has been seeing Dr. Alvester Morin for her back.  Had difficulty using the equalizer boot in her right foot because it bothered her back.  HPI  Review of Systems   Objective: Vital Signs: There were no vitals taken for this visit.  Physical Exam Constitutional:      Appearance: She is well-developed.  Eyes:     Pupils: Pupils are equal, round, and reactive to light.  Pulmonary:     Effort: Pulmonary effort is normal.  Skin:    General: Skin is warm and dry.  Neurological:     Mental Status: She is alert and oriented to person, place, and time.  Psychiatric:        Behavior: Behavior normal.    Ortho Exam exam limited to the right foot.  There was no pain along the posterior tibial tendon but she did pronate.  The posterior tibial tendon appears to be functioning.  Sensation intact.  Most of her pain is along the midfoot dorsally where  she had evidence of arthritis on her prior MRI scan.  No pain with ankle motion.  Skin intact.  Straight leg raise was negative  Specialty Comments:  No specialty comments available.  Imaging: No results found.   PMFS History: Patient Active Problem List   Diagnosis Date Noted   Tibialis posterior tendinitis, right 05/22/2020   Herniated nucleus pulposus, L4-5 right 05/22/2020   Arthritis of right ankle 05/22/2020   Pain in right ankle and joints of right foot 05/08/2020   Low back pain 05/08/2020   Morbid (severe) obesity due to excess calories (HCC) 05/08/2020   Past Medical History:  Diagnosis Date   ADD (attention deficit disorder)    Anxiety    Depression    Essential hypertension    GERD (gastroesophageal reflux disease)    Hyperlipidemia    Type 2 diabetes mellitus (HCC)     Family History  Problem Relation Age of Onset   AAA  (abdominal aortic aneurysm) Mother     Past Surgical History:  Procedure Laterality Date   BILATERAL KNEE REPAIRS     CESAREAN SECTION     x 2   Social History   Occupational History   Not on file  Tobacco Use   Smoking status: Former    Types: Cigarettes    Quit date: 09/01/1999    Years since quitting: 21.7   Smokeless tobacco: Never   Tobacco comments:    smoked for approx 20 years   Substance and Sexual Activity   Alcohol use: Never   Drug use: Never   Sexual activity: Not on file

## 2021-06-23 ENCOUNTER — Ambulatory Visit: Payer: 59 | Admitting: Physical Medicine and Rehabilitation

## 2021-06-23 NOTE — H&P (View-Only) (Signed)
Referring Provider:Browning, Riley Lam, MD Primary Care Physician:  Waldon Reining, MD Primary Gastroenterologist:  Dr. Marletta Lor  Chief Complaint  Patient presents with   Colonoscopy    Last tcs 2015 in [place]. Needs 5 year repeat, hx polyps    HPI:   Sarah Porter is a 60 y.o. female presenting today at the request of Waldon Reining, MD for consult colonoscopy.  Office visit due to medications.  Patient reports she is doing well today with no GI concerns.  Denies abdominal pain, BRBPR, melena, unintentional weight loss.  She is trying to lose weight.  She exercises 5 days a week and is following weight watchers.  States when she does not eat clean, she will have some looser stools, otherwise no trouble with diarrhea or constipation.  She has history of colon polyps and has been undergoing colonoscopies every 5 years in [place].  She has not had insurance which is why she is running behind.  Her last colonoscopy was in 2015.  She had reflux symptoms many years ago, but this has not been a problem for her in quite some time.  She is not requiring any sort of acid reflux medications.  Denies dysphagia, nausea, vomiting, or abdominal pain.  Takes ibuprofen and tylenol for back pain and arthritis in her foot. Several days a week. Has upcoming injection in her foot which she hopes will help.   Reports metoprolol is due to history of irregular heart beat, cardiology evaluated and every thing checked out fine.  Rare palpitations.    Reviewed colonoscopy and endoscopy records received in referral.  Surgical history updated.  Past Medical History:  Diagnosis Date   ADD (attention deficit disorder)    Anxiety    Depression    Essential hypertension    patient deies this   GERD (gastroesophageal reflux disease)    Hyperlipidemia    Type 2 diabetes mellitus (HCC)     Past Surgical History:  Procedure Laterality Date   BILATERAL KNEE REPAIRS     CESAREAN SECTION     x 2   COLONOSCOPY   11/04/2011   Dr. Edrick Kins; diminutive colon polyp in the rectum, otherwise normal exam.  Pathology with tubular adenoma.  Recommended 5-year repeat.   COLONOSCOPY  07/12/2014   Dr. Edrick Kins; submucosal hemorrhages in proximal descending colon?  Prior ischemic or infectious colitis?,  Otherwise normal exam.  Recommended 5-year repeat.   ESOPHAGOGASTRODUODENOSCOPY  07/21/2004   Dr. Edrick Kins; grade 2 erosive esophagitis at EG junction, cervical inlet patch, otherwise normal exam.    Current Outpatient Medications  Medication Sig Dispense Refill   amphetamine-dextroamphetamine (ADDERALL) 20 MG tablet Take 10 mg by mouth 2 (two) times daily.     ascorbic acid (VITAMIN C) 500 MG tablet Take 500 mg by mouth daily.     atorvastatin (LIPITOR) 40 MG tablet Take 40 mg by mouth daily.     cholecalciferol (VITAMIN D3) 25 MCG (1000 UNIT) tablet Take 1,000 Units by mouth daily.     FLUoxetine (PROZAC) 20 MG capsule Take 20 mg by mouth daily.     lisinopril (ZESTRIL) 2.5 MG tablet Take 2.5 mg by mouth daily.     metFORMIN (GLUCOPHAGE) 500 MG tablet Take 500 mg by mouth 2 (two) times daily with a meal.     metoprolol succinate (TOPROL-XL) 25 MG 24 hr tablet Take 25 mg by mouth daily.     Multiple Vitamin (MULTIVITAMIN) tablet Take 1 tablet by mouth daily.  No current facility-administered medications for this visit.    Allergies as of 06/25/2021   (No Known Allergies)    Family History  Problem Relation Age of Onset   AAA (abdominal aortic aneurysm) Mother    Colon cancer Neg Hx     Social History   Socioeconomic History   Marital status: Single    Spouse name: Not on file   Number of children: Not on file   Years of education: Not on file   Highest education level: Not on file  Occupational History   Not on file  Tobacco Use   Smoking status: Former    Types: Cigarettes    Quit date: 09/01/1999    Years since quitting: 21.8   Smokeless tobacco: Never   Tobacco comments:     smoked for approx 20 years   Substance and Sexual Activity   Alcohol use: Not Currently   Drug use: Yes    Types: Marijuana    Comment: occasionally   Sexual activity: Not on file  Other Topics Concern   Not on file  Social History Narrative   Not on file   Social Determinants of Health   Financial Resource Strain: Not on file  Food Insecurity: Not on file  Transportation Needs: Not on file  Physical Activity: Not on file  Stress: Not on file  Social Connections: Not on file  Intimate Partner Violence: Not on file    Review of Systems: Gen: Denies any fever, chills, cold or flulike symptoms, presyncope, syncope. CV: Denies chest pain. Resp: Denies shortness of breath or cough. GI: See HPI GU : Denies urinary burning, urinary frequency, urinary hesitancy MS: Chronic back and foot pain in the setting of arthritis. Derm: Denies rash Psych: Denies depression.  Admits to anxiety.  Well-controlled with medications. Heme: See HPI  Physical Exam: BP 134/77   Pulse 63   Temp (!) 97.3 F (36.3 C) (Temporal)   Ht 5' 1" (1.549 m)   Wt 215 lb 3.2 oz (97.6 kg)   BMI 40.66 kg/m  General:   Alert and oriented. Pleasant and cooperative. Well-nourished and well-developed.  Head:  Normocephalic and atraumatic. Eyes:  Without icterus, sclera clear and conjunctiva pink.  Ears:  Normal auditory acuity. Lungs:  Clear to auscultation bilaterally. No wheezes, rales, or rhonchi. No distress.  Heart:  S1, S2 present without murmurs appreciated.  Abdomen:  +BS, soft, non-tender and non-distended. No HSM noted. No guarding or rebound. No masses appreciated.  Rectal:  Deferred  Msk:  Symmetrical without gross deformities. Normal posture. Extremities:  Without edema. Neurologic:  Alert and  oriented x4;  grossly normal neurologically. Skin:  Intact without significant lesions or rashes. Psych: Normal mood and affect.    Assessment: 60-year-old female with history of HTN, HLD,  diabetes, anxiety, ADD, adenomatous colon polyps presenting today to discuss scheduling surveillance colonoscopy.  Prior colonoscopies in [place] with last colonoscopy in 2015, no polyps at that time, she did have submucosal hemorrhages in proximal descending colon ?prior ischemic or infectious colitis?, recommended repeat in 5 years, currently overdue.  She has no significant upper or lower GI symptoms.  No alarm symptoms.  No family history of colon cancer.    Plan: Proceed with colonoscopy with propofol with Dr. Carver in the near future. The risks, benefits, and alternatives have been discussed with the patient in detail. The patient states understanding and desires to proceed.  No ASA 2 See separate instructions for diabetes medication adjustments. Follow-up as   needed.    Deano Tomaszewski, PA-C [facility] 06/25/2021  

## 2021-06-23 NOTE — Progress Notes (Signed)
Referring Provider:Browning, Riley Lam, MD Primary Care Physician:  Waldon Reining, MD Primary Gastroenterologist:  Dr. Marletta Lor  Chief Complaint  Patient presents with   Colonoscopy    Last tcs 2015 in [place]. Needs 5 year repeat, hx polyps    HPI:   Sarah Porter is a 60 y.o. female presenting today at the request of Waldon Reining, MD for consult colonoscopy.  Office visit due to medications.  Patient reports she is doing well today with no GI concerns.  Denies abdominal pain, BRBPR, melena, unintentional weight loss.  She is trying to lose weight.  She exercises 5 days a week and is following weight watchers.  States when she does not eat clean, she will have some looser stools, otherwise no trouble with diarrhea or constipation.  She has history of colon polyps and has been undergoing colonoscopies every 5 years in [place].  She has not had insurance which is why she is running behind.  Her last colonoscopy was in 2015.  She had reflux symptoms many years ago, but this has not been a problem for her in quite some time.  She is not requiring any sort of acid reflux medications.  Denies dysphagia, nausea, vomiting, or abdominal pain.  Takes ibuprofen and tylenol for back pain and arthritis in her foot. Several days a week. Has upcoming injection in her foot which she hopes will help.   Reports metoprolol is due to history of irregular heart beat, cardiology evaluated and every thing checked out fine.  Rare palpitations.    Reviewed colonoscopy and endoscopy records received in referral.  Surgical history updated.  Past Medical History:  Diagnosis Date   ADD (attention deficit disorder)    Anxiety    Depression    Essential hypertension    patient deies this   GERD (gastroesophageal reflux disease)    Hyperlipidemia    Type 2 diabetes mellitus (HCC)     Past Surgical History:  Procedure Laterality Date   BILATERAL KNEE REPAIRS     CESAREAN SECTION     x 2   COLONOSCOPY   11/04/2011   Dr. Edrick Kins; diminutive colon polyp in the rectum, otherwise normal exam.  Pathology with tubular adenoma.  Recommended 5-year repeat.   COLONOSCOPY  07/12/2014   Dr. Edrick Kins; submucosal hemorrhages in proximal descending colon?  Prior ischemic or infectious colitis?,  Otherwise normal exam.  Recommended 5-year repeat.   ESOPHAGOGASTRODUODENOSCOPY  07/21/2004   Dr. Edrick Kins; grade 2 erosive esophagitis at EG junction, cervical inlet patch, otherwise normal exam.    Current Outpatient Medications  Medication Sig Dispense Refill   amphetamine-dextroamphetamine (ADDERALL) 20 MG tablet Take 10 mg by mouth 2 (two) times daily.     ascorbic acid (VITAMIN C) 500 MG tablet Take 500 mg by mouth daily.     atorvastatin (LIPITOR) 40 MG tablet Take 40 mg by mouth daily.     cholecalciferol (VITAMIN D3) 25 MCG (1000 UNIT) tablet Take 1,000 Units by mouth daily.     FLUoxetine (PROZAC) 20 MG capsule Take 20 mg by mouth daily.     lisinopril (ZESTRIL) 2.5 MG tablet Take 2.5 mg by mouth daily.     metFORMIN (GLUCOPHAGE) 500 MG tablet Take 500 mg by mouth 2 (two) times daily with a meal.     metoprolol succinate (TOPROL-XL) 25 MG 24 hr tablet Take 25 mg by mouth daily.     Multiple Vitamin (MULTIVITAMIN) tablet Take 1 tablet by mouth daily.  No current facility-administered medications for this visit.    Allergies as of 06/25/2021   (No Known Allergies)    Family History  Problem Relation Age of Onset   AAA (abdominal aortic aneurysm) Mother    Colon cancer Neg Hx     Social History   Socioeconomic History   Marital status: Single    Spouse name: Not on file   Number of children: Not on file   Years of education: Not on file   Highest education level: Not on file  Occupational History   Not on file  Tobacco Use   Smoking status: Former    Types: Cigarettes    Quit date: 09/01/1999    Years since quitting: 21.8   Smokeless tobacco: Never   Tobacco comments:     smoked for approx 20 years   Substance and Sexual Activity   Alcohol use: Not Currently   Drug use: Yes    Types: Marijuana    Comment: occasionally   Sexual activity: Not on file  Other Topics Concern   Not on file  Social History Narrative   Not on file   Social Determinants of Health   Financial Resource Strain: Not on file  Food Insecurity: Not on file  Transportation Needs: Not on file  Physical Activity: Not on file  Stress: Not on file  Social Connections: Not on file  Intimate Partner Violence: Not on file    Review of Systems: Gen: Denies any fever, chills, cold or flulike symptoms, presyncope, syncope. CV: Denies chest pain. Resp: Denies shortness of breath or cough. GI: See HPI GU : Denies urinary burning, urinary frequency, urinary hesitancy MS: Chronic back and foot pain in the setting of arthritis. Derm: Denies rash Psych: Denies depression.  Admits to anxiety.  Well-controlled with medications. Heme: See HPI  Physical Exam: BP 134/77   Pulse 63   Temp (!) 97.3 F (36.3 C) (Temporal)   Ht 5\' 1"  (1.549 m)   Wt 215 lb 3.2 oz (97.6 kg)   BMI 40.66 kg/m  General:   Alert and oriented. Pleasant and cooperative. Well-nourished and well-developed.  Head:  Normocephalic and atraumatic. Eyes:  Without icterus, sclera clear and conjunctiva pink.  Ears:  Normal auditory acuity. Lungs:  Clear to auscultation bilaterally. No wheezes, rales, or rhonchi. No distress.  Heart:  S1, S2 present without murmurs appreciated.  Abdomen:  +BS, soft, non-tender and non-distended. No HSM noted. No guarding or rebound. No masses appreciated.  Rectal:  Deferred  Msk:  Symmetrical without gross deformities. Normal posture. Extremities:  Without edema. Neurologic:  Alert and  oriented x4;  grossly normal neurologically. Skin:  Intact without significant lesions or rashes. Psych: Normal mood and affect.    Assessment: 60 year old female with history of HTN, HLD,  diabetes, anxiety, ADD, adenomatous colon polyps presenting today to discuss scheduling surveillance colonoscopy.  Prior colonoscopies in 67 with last colonoscopy in 2015, no polyps at that time, she did have submucosal hemorrhages in proximal descending colon ?prior ischemic or infectious colitis?, recommended repeat in 5 years, currently overdue.  She has no significant upper or lower GI symptoms.  No alarm symptoms.  No family history of colon cancer.    Plan: Proceed with colonoscopy with propofol with Dr. 2016 in the near future. The risks, benefits, and alternatives have been discussed with the patient in detail. The patient states understanding and desires to proceed.  No ASA 2 See separate instructions for diabetes medication adjustments. Follow-up as  needed.    Ermalinda Memos, PA-C [facility] 06/25/2021

## 2021-06-25 ENCOUNTER — Encounter: Payer: Self-pay | Admitting: Gastroenterology

## 2021-06-25 ENCOUNTER — Other Ambulatory Visit: Payer: Self-pay

## 2021-06-25 ENCOUNTER — Ambulatory Visit (INDEPENDENT_AMBULATORY_CARE_PROVIDER_SITE_OTHER): Payer: Self-pay | Admitting: Gastroenterology

## 2021-06-25 VITALS — BP 134/77 | HR 63 | Temp 97.3°F | Ht 61.0 in | Wt 215.2 lb

## 2021-06-25 DIAGNOSIS — Z86010 Personal history of colonic polyps: Secondary | ICD-10-CM

## 2021-06-25 MED ORDER — PEG 3350-KCL-NA BICARB-NACL 420 G PO SOLR: 4000.0000 mL | ORAL | 0 refills | Status: DC

## 2021-06-25 NOTE — Patient Instructions (Signed)
We will arrange for you to have a colonoscopy in the near future with Dr. Marletta Lor. 1 day prior to procedure: Take one half dose of metformin (250 mg in the morning and evening) Day of procedure: Do not take any morning diabetes medications.  If you feel your blood sugars are low, you may correct this if your blood sugar becomes low, you may correct this with improved sugary clear liquids.  As we discussed, try limiting ibuprofen as much as possible and use Tylenol first for pain.  No more than 3000 mg of Tylenol per day.  Follow-up as needed.  Do not hesitate to call with any new GI concerns.   It was a pleasure meeting you today!  Ermalinda Memos, PA-C [facility]

## 2021-06-26 ENCOUNTER — Ambulatory Visit: Payer: Self-pay

## 2021-06-26 ENCOUNTER — Ambulatory Visit (INDEPENDENT_AMBULATORY_CARE_PROVIDER_SITE_OTHER): Payer: 59 | Admitting: Physical Medicine and Rehabilitation

## 2021-06-26 ENCOUNTER — Encounter: Payer: Self-pay | Admitting: Physical Medicine and Rehabilitation

## 2021-06-26 DIAGNOSIS — G8929 Other chronic pain: Secondary | ICD-10-CM

## 2021-06-26 NOTE — Progress Notes (Signed)
Pt state right foot pain. Pt state walking and standing makes the pain worse. Pt state she takes over the counter painmeds to help ease her pain.  Numeric Pain Rating Scale and Functional Assessment Average Pain 7   In the last MONTH (on 0-10 scale) has pain interfered with the following?  1. General activity like being  able to carry out your everyday physical activities such as walking, climbing stairs, carrying groceries, or moving a chair?  Rating(9)   -BT, -Dye Allergies.

## 2021-07-01 ENCOUNTER — Encounter: Payer: Self-pay | Admitting: Physical Medicine and Rehabilitation

## 2021-07-01 ENCOUNTER — Ambulatory Visit (INDEPENDENT_AMBULATORY_CARE_PROVIDER_SITE_OTHER): Payer: 59 | Admitting: Physical Medicine and Rehabilitation

## 2021-07-01 ENCOUNTER — Other Ambulatory Visit: Payer: Self-pay

## 2021-07-01 VITALS — BP 138/87 | HR 69

## 2021-07-01 DIAGNOSIS — G8929 Other chronic pain: Secondary | ICD-10-CM | POA: Diagnosis not present

## 2021-07-01 DIAGNOSIS — M545 Low back pain, unspecified: Secondary | ICD-10-CM

## 2021-07-01 DIAGNOSIS — M47816 Spondylosis without myelopathy or radiculopathy, lumbar region: Secondary | ICD-10-CM

## 2021-07-01 NOTE — Progress Notes (Signed)
Pt state lower back pain mostly on her left side. Pt state standing and sitting foe a long time then having to get up makes the pain worse. Pt state she takes over the counter pain meds to help ease her pain. Pt has hx of inj on 05/26/21 pt state it really didn't help. Pt state she doesn't have much pain in her left knee.  Numeric Pain Rating Scale and Functional Assessment Average Pain 9 Pain Right Now 7 My pain is constant, dull, and aching Pain is worse with: sitting, standing, and some activites Pain improves with: medication and injections   In the last MONTH (on 0-10 scale) has pain interfered with the following?  1. General activity like being  able to carry out your everyday physical activities such as walking, climbing stairs, carrying groceries, or moving a chair?  Rating(5)  2. Relation with others like being able to carry out your usual social activities and roles such as  activities at home, at work and in your community. Rating(6)  3. Enjoyment of life such that you have  been bothered by emotional problems such as feeling anxious, depressed or irritable?  Rating(7)

## 2021-07-01 NOTE — Progress Notes (Signed)
Sarah DustDonna Lapre - 60 y.o. female MRN [id number]  Date of birth: 06/21/1961  Office Visit Note: Visit Date: 07/01/2021 PCP: Waldon ReiningBrowning, Douglas, MD Referred by: Waldon ReiningBrowning, Douglas, MD  Subjective: Chief Complaint  Patient presents with   Lower Back - Pain   HPI: Sarah Porter is a 60 y.o. female who comes in today for evaluation of chronic, worsening and severe bilateral lower back pain. Patient reports no relief from left L3-L4 and L4-L5 lumbar facet joint/medial branch blocks on 05/26/21. Patient states pain is exacerbated by prolonged sitting and standing, describes as soreness sensation and currently rates as 7 out of 10. Patient reports some relief with home exercises, water aerobics, Ibuprofen and Tylenol. Patient did attend formal chiropractic treatment [facility] several years ago and reports this did help to alleviate pain. Patient's lumbar MRI from 2021 exhibits bilateral L3-L4 and right L4-L5 foraminal narrowing, no evidence of spinal canal narrowing. There is also a right L4-L5 foraminal protrusion noted. Patient states she is very active and rides her motorcycle frequently, however reports she has not been able to ride lately due to severe pain. Patient denies focal weakness, numbness and tingling. Patient denies recent trauma or falls.   Review of Systems  Musculoskeletal:  Positive for back pain.  Neurological:  Negative for tingling, sensory change, focal weakness and weakness.  All other systems reviewed and are negative. Otherwise per HPI.  Assessment & Plan: Visit Diagnoses:    ICD-10-CM   1. Chronic bilateral low back pain without sciatica  M54.50    G89.29     2. Spondylosis without myelopathy or radiculopathy, lumbar region  M47.816     3. Facet arthropathy, lumbar  M47.816        Plan: Findings:  Chronic, worsening and severe bilateral axial back pain. Patient continues to have excruciating pain despite good conservative therapies such as formal chiropractic  treatment, home exercise program, and medications. Patient's clinical presentation and exam are consistent with facet mediated pattern, we also feel there is a myofascial component as well. After speaking with patient in detail about plan of care we feel the next step is to re-group with [facility] for evaluation and treatment of bilateral lower back pain. We did write a paper prescription for her to take to Chiropractor. Patient instructed to follow-up with us after several sessions at the chiropractor or if her pain becomes more severe. No red flag symptoms noted upon exam.    Meds & Orders: No orders of the defined types were placed in this encounter.  No orders of the defined types were placed in this encounter.   Follow-up: Return if symptoms worsen or fail to improve.   Procedures: No procedures performed      Clinical History: EXAM: MRI LUMBAR SPINE WITHOUT CONTRAST   TECHNIQUE: Multiplanar, multisequence MR imaging of the lumbar spine was performed. No intravenous contrast was administered.   COMPARISON:  None.   FINDINGS: Segmentation:  Standard.   Alignment:  Normal.   Vertebrae: Normal bone marrow signal intensity. Scattered T1/T2 hyperintense foci may reflect hemangiomata versus focal fat.   Conus medullaris and cauda equina: Conus extends to the L2 level. Conus and cauda equina appear normal.   Disc levels: Multilevel desiccation and mild disc space loss.   L1-2: No significant disc bulge, spinal canal or neural foraminal narrowing.   L2-3: Mild disc bulge. Left extraforaminal annular fissuring. Bilateral facet degenerative spurring. No significant spinal canal or neural foraminal narrowing.   L3-4: Disc bulge, mild  ligamentum flavum and bilateral facet hypertrophy. Mild bilateral neural foraminal narrowing. Patent spinal canal.   L4-5: Disc bulge with superimposed right foraminal protrusion grazing the exiting right L4 nerve root. Bilateral  facet hypertrophy. Patent spinal canal and left neural foramen. Mild right neural foraminal narrowing.   L5-S1: No significant disc bulge, spinal canal or neural foraminal narrowing.   Paraspinal and other soft tissues: Negative.   IMPRESSION: Mild bilateral L3-4 and right L4-5 neural foraminal narrowing.   No significant spinal canal narrowing.   Right L4-5 foraminal protrusion grazing the exiting right L4 nerve root.     Electronically Signed   By: Stana Bunting M.D.   On: 05/21/2020 10:19   She reports that she quit smoking about 21 years ago. Her smoking use included cigarettes. She has never used smokeless tobacco. No results for input(s): HGBA1C, LABURIC in the last 8760 hours.  Objective:  VS:  HT:    WT:   BMI:     BP:138/87  HR:69bpm  TEMP: ( )  RESP:  Physical Exam Vitals and nursing note reviewed.  HENT:     Head: Normocephalic and atraumatic.     Right Ear: External ear normal.     Left Ear: External ear normal.     Nose: Nose normal.     Mouth/Throat:     Mouth: Mucous membranes are moist.  Eyes:     Extraocular Movements: Extraocular movements intact.  Cardiovascular:     Rate and Rhythm: Normal rate.     Pulses: Normal pulses.  Pulmonary:     Effort: Pulmonary effort is normal.  Abdominal:     General: Abdomen is flat. There is no distension.  Musculoskeletal:        General: Tenderness present.     Cervical back: Normal range of motion.     Comments: Pt is slow to rise from seated position to standing. Pain noted upon facet loading. Strong distal strength without clonus, no pain upon palpation of greater trochanters. Sensation intact bilaterally. Walks independently, gait steady.     Skin:    General: Skin is warm and dry.     Capillary Refill: Capillary refill takes less than 2 seconds.  Neurological:     General: No focal deficit present.     Mental Status: She is alert.  Psychiatric:        Mood and Affect: Mood normal.    Ortho  Exam  Imaging: No results found.  Past Medical/Family/Surgical/Social History: Medications & Allergies reviewed per EMR, new medications updated. Patient Active Problem List   Diagnosis Date Noted   H/O adenomatous polyp of colon 06/25/2021   Tibialis posterior tendinitis, right 05/22/2020   Herniated nucleus pulposus, L4-5 right 05/22/2020   Arthritis of right ankle 05/22/2020   Pain in right ankle and joints of right foot 05/08/2020   Low back pain 05/08/2020   Morbid (severe) obesity due to excess calories (HCC) 05/08/2020   Past Medical History:  Diagnosis Date   ADD (attention deficit disorder)    Anxiety    Depression    Essential hypertension    patient deies this   GERD (gastroesophageal reflux disease)    Hyperlipidemia    Type 2 diabetes mellitus (HCC)    Family History  Problem Relation Age of Onset   AAA (abdominal aortic aneurysm) Mother    Colon cancer Neg Hx    Past Surgical History:  Procedure Laterality Date   BILATERAL KNEE REPAIRS     CESAREAN SECTION  x 2   COLONOSCOPY  11/04/2011   Dr. Sharrie Rothman; diminutive colon polyp in the rectum, otherwise normal exam.  Pathology with tubular adenoma.  Recommended 5-year repeat.   COLONOSCOPY  07/12/2014   Dr. Sharrie Rothman; submucosal hemorrhages in proximal descending colon?  Prior ischemic or infectious colitis?,  Otherwise normal exam.  Recommended 5-year repeat.   ESOPHAGOGASTRODUODENOSCOPY  07/21/2004   Dr. Sharrie Rothman; grade 2 erosive esophagitis at EG junction, cervical inlet patch, otherwise normal exam.   Social History   Occupational History   Not on file  Tobacco Use   Smoking status: Former    Types: Cigarettes    Quit date: 09/01/1999    Years since quitting: 21.8   Smokeless tobacco: Never   Tobacco comments:    smoked for approx 20 years   Substance and Sexual Activity   Alcohol use: Not Currently   Drug use: Yes    Types: Marijuana    Comment: occasionally   Sexual activity:  Not on file

## 2021-07-22 ENCOUNTER — Encounter (HOSPITAL_COMMUNITY)
Admission: RE | Disposition: A | Discharge status: Home / Self Care | Payer: Self-pay | Source: Home / Self Care | Attending: Internal Medicine

## 2021-07-22 ENCOUNTER — Other Ambulatory Visit: Payer: Self-pay

## 2021-07-22 ENCOUNTER — Ambulatory Visit (HOSPITAL_COMMUNITY): Payer: 59 | Admitting: Anesthesiology

## 2021-07-22 ENCOUNTER — Encounter (HOSPITAL_COMMUNITY): Payer: Self-pay

## 2021-07-22 ENCOUNTER — Ambulatory Visit (HOSPITAL_COMMUNITY)
Admission: RE | Admit: 2021-07-22 | Discharge: 2021-07-22 | Disposition: A | Discharge status: Home / Self Care | Payer: 59 | Attending: Internal Medicine | Admitting: Internal Medicine

## 2021-07-22 DIAGNOSIS — M19079 Primary osteoarthritis, unspecified ankle and foot: Secondary | ICD-10-CM | POA: Insufficient documentation

## 2021-07-22 DIAGNOSIS — Z1211 Encounter for screening for malignant neoplasm of colon: Secondary | ICD-10-CM | POA: Insufficient documentation

## 2021-07-22 DIAGNOSIS — Z7984 Long term (current) use of oral hypoglycemic drugs: Secondary | ICD-10-CM | POA: Diagnosis not present

## 2021-07-22 DIAGNOSIS — F17210 Nicotine dependence, cigarettes, uncomplicated: Secondary | ICD-10-CM | POA: Insufficient documentation

## 2021-07-22 DIAGNOSIS — K648 Other hemorrhoids: Secondary | ICD-10-CM | POA: Insufficient documentation

## 2021-07-22 DIAGNOSIS — Z79899 Other long term (current) drug therapy: Secondary | ICD-10-CM | POA: Insufficient documentation

## 2021-07-22 DIAGNOSIS — E119 Type 2 diabetes mellitus without complications: Secondary | ICD-10-CM | POA: Insufficient documentation

## 2021-07-22 DIAGNOSIS — F909 Attention-deficit hyperactivity disorder, unspecified type: Secondary | ICD-10-CM | POA: Insufficient documentation

## 2021-07-22 DIAGNOSIS — Z86010 Personal history of colonic polyps: Secondary | ICD-10-CM | POA: Diagnosis not present

## 2021-07-22 DIAGNOSIS — I10 Essential (primary) hypertension: Secondary | ICD-10-CM | POA: Diagnosis not present

## 2021-07-22 DIAGNOSIS — I499 Cardiac arrhythmia, unspecified: Secondary | ICD-10-CM | POA: Insufficient documentation

## 2021-07-22 DIAGNOSIS — F419 Anxiety disorder, unspecified: Secondary | ICD-10-CM | POA: Diagnosis not present

## 2021-07-22 DIAGNOSIS — M549 Dorsalgia, unspecified: Secondary | ICD-10-CM | POA: Insufficient documentation

## 2021-07-22 DIAGNOSIS — F32A Depression, unspecified: Secondary | ICD-10-CM | POA: Insufficient documentation

## 2021-07-22 HISTORY — PX: COLONOSCOPY WITH PROPOFOL: SHX5780

## 2021-07-22 LAB — GLUCOSE, CAPILLARY: Glucose-Capillary: 121 mg/dL — ABNORMAL HIGH (ref 70–99)

## 2021-07-22 SURGERY — COLONOSCOPY WITH PROPOFOL
Anesthesia: General

## 2021-07-22 MED ORDER — PROPOFOL 500 MG/50ML IV EMUL
INTRAVENOUS | Status: DC | PRN
  Administered 2021-07-22: 150 ug/kg/min via INTRAVENOUS

## 2021-07-22 MED ORDER — PROPOFOL 10 MG/ML IV BOLUS
INTRAVENOUS | Status: DC | PRN
  Administered 2021-07-22: 120 mg via INTRAVENOUS

## 2021-07-22 MED ORDER — LACTATED RINGERS IV SOLN: INTRAVENOUS | Status: DC

## 2021-07-22 NOTE — Anesthesia Preprocedure Evaluation (Addendum)
Anesthesia Evaluation  Patient identified by MRN, date of birth, ID band Patient awake    Reviewed: Allergy & Precautions, H&P , NPO status , Patient's Chart, lab work & pertinent test results, reviewed documented beta blocker date and time   History of Anesthesia Complications Negative for: history of anesthetic complications  Airway Mallampati: II  TM Distance: >3 FB Neck ROM: Full    Dental  (+) Dental Advisory Given, Missing   Pulmonary neg pulmonary ROS, former smoker,    Pulmonary exam normal breath sounds clear to auscultation       Cardiovascular hypertension, Pt. on home beta blockers and Pt. on medications Normal cardiovascular exam Rhythm:Regular Rate:Normal     Neuro/Psych PSYCHIATRIC DISORDERS Anxiety Depression negative neurological ROS     GI/Hepatic GERD  Controlled,(+)     substance abuse  marijuana use,   Endo/Other  diabetes, Well Controlled, Type 2, Oral Hypoglycemic Agents  Renal/GU negative Renal ROS  negative genitourinary   Musculoskeletal  (+) Arthritis , Osteoarthritis,    Abdominal   Peds negative pediatric ROS (+) ADHD Hematology negative hematology ROS (+)   Anesthesia Other Findings Low back pain  Reproductive/Obstetrics negative OB ROS                            Anesthesia Physical Anesthesia Plan  ASA: 2  Anesthesia Plan: General   Post-op Pain Management: Minimal or no pain anticipated   Induction:   PONV Risk Score and Plan: TIVA  Airway Management Planned: Nasal Cannula and Natural Airway  Additional Equipment:   Intra-op Plan:   Post-operative Plan:   Informed Consent: I have reviewed the patients History and Physical, chart, labs and discussed the procedure including the risks, benefits and alternatives for the proposed anesthesia with the patient or authorized representative who has indicated his/her understanding and acceptance.      Dental advisory given  Plan Discussed with: CRNA and Surgeon  Anesthesia Plan Comments:         Anesthesia Quick Evaluation

## 2021-07-22 NOTE — Discharge Instructions (Addendum)
  Colonoscopy Discharge Instructions  Read the instructions outlined below and refer to this sheet in the next few weeks. These discharge instructions provide you with general information on caring for yourself after you leave the hospital. Your doctor may also give you specific instructions. While your treatment has been planned according to the most current medical practices available, unavoidable complications occasionally occur.   ACTIVITY You may resume your regular activity, but move at a slower pace for the next 24 hours.  Take frequent rest periods for the next 24 hours.  Walking will help get rid of the air and reduce the bloated feeling in your belly (abdomen).  No driving for 24 hours (because of the medicine (anesthesia) used during the test).   Do not sign any important legal documents or operate any machinery for 24 hours (because of the anesthesia used during the test).  NUTRITION Drink plenty of fluids.  You may resume your normal diet as instructed by your doctor.  Begin with a light meal and progress to your normal diet. Heavy or fried foods are harder to digest and may make you feel sick to your stomach (nauseated).  Avoid alcoholic beverages for 24 hours or as instructed.  MEDICATIONS You may resume your normal medications unless your doctor tells you otherwise.  WHAT YOU CAN EXPECT TODAY Some feelings of bloating in the abdomen.  Passage of more gas than usual.  Spotting of blood in your stool or on the toilet paper.  IF YOU HAD POLYPS REMOVED DURING THE COLONOSCOPY: No aspirin products for 7 days or as instructed.  No alcohol for 7 days or as instructed.  Eat a soft diet for the next 24 hours.  FINDING OUT THE RESULTS OF YOUR TEST Not all test results are available during your visit. If your test results are not back during the visit, make an appointment with your caregiver to find out the results. Do not assume everything is normal if you have not heard from your  caregiver or the medical facility. It is important for you to follow up on all of your test results.  SEEK IMMEDIATE MEDICAL ATTENTION IF: You have more than a spotting of blood in your stool.  Your belly is swollen (abdominal distention).  You are nauseated or vomiting.  You have a temperature over 101.  You have abdominal pain or discomfort that is severe or gets worse throughout the day.   Your colonoscopy was relatively unremarkable.  I did not find any polyps or evidence of colon cancer.  I recommend repeating colonoscopy in 10 years for colon cancer screening purposes. Follow up with Gi as needed.  I hope you have a great rest of your week!  Hennie Duos. Marletta Lor, D.O. Gastroenterology and Hepatology [facility]

## 2021-07-22 NOTE — Interval H&P Note (Signed)
History and Physical Interval Note:  07/22/2021 7:24 AM  Sarah Porter  has presented today for surgery, with the diagnosis of history of adenomatous colon polyps.  The various methods of treatment have been discussed with the patient and family. After consideration of risks, benefits and other options for treatment, the patient has consented to  Procedure(s) with comments: COLONOSCOPY WITH PROPOFOL (N/A) - 8:45am as a surgical intervention.  The patient's history has been reviewed, patient examined, no change in status, stable for surgery.  I have reviewed the patient's chart and labs.  Questions were answered to the patient's satisfaction.     Lanelle Bal

## 2021-07-22 NOTE — Anesthesia Postprocedure Evaluation (Signed)
Anesthesia Post Note  Patient: Sarah Porter  Procedure(s) Performed: COLONOSCOPY WITH PROPOFOL  Patient location during evaluation: Endoscopy Anesthesia Type: General Anesthetic complications: no Comments: Endoscopy nurses discharged patient as per protocol.   No notable events documented.   Last Vitals:  Vitals:   07/22/21 0715 07/22/21 0859  BP: 123/77 (!) 94/54  Pulse: (!) 58   Resp: 14 11  Temp: 36.5 C 36.7 C  SpO2: 99% 96%    Last Pain:  Vitals:   07/22/21 0859  TempSrc: Oral  PainSc: 0-No pain                 Effie Wahlert C Haneef Hallquist

## 2021-07-22 NOTE — Op Note (Signed)
[facility] Patient Name: Sarah Porter Procedure Date: 07/22/2021 8:36 AM MRN: [id number] Date of Birth: 1961-02-12 Attending MD: Elon Alas. Edgar Frisk CSN: [id number] Age: 60 Admit Type: Outpatient Procedure:                Colonoscopy Indications:              High risk colon cancer surveillance: Personal                            history of colonic polyps Providers:                Elon Alas. Abbey Chatters, DO, Janeece Riggers, RN, Nelma Rothman,                            Technician Referring MD:              Medicines:                See the Anesthesia note for documentation of the                            administered medications Complications:            No immediate complications. Estimated Blood Loss:     Estimated blood loss: none. Procedure:                Pre-Anesthesia Assessment:                           - The anesthesia plan was to use monitored                            anesthesia care (MAC).                           After obtaining informed consent, the colonoscope                            was passed under direct vision. Throughout the                            procedure, the patient's blood pressure, pulse, and                            oxygen saturations were monitored continuously. The                            [id number]-HQ190L ([id number]) scope was introduced through                            the anus and advanced to the the cecum, identified                            by appendiceal orifice and ileocecal valve. The                            colonoscopy was performed without difficulty. The  patient tolerated the procedure well. The quality                            of the bowel preparation was evaluated using the                            BBPS [facility] Bowel Preparation Scale) with scores                            of: Right Colon = 3, Transverse Colon = 3 and Left                            Colon = 3 (entire mucosa seen well with no  residual                            staining, small fragments of stool or opaque                            liquid). The total BBPS score equals 9. Scope In: 8:47:23 AM Scope Out: 8:57:12 AM Scope Withdrawal Time: 0 hours 6 minutes 13 seconds  Total Procedure Duration: 0 hours 9 minutes 49 seconds  Findings:      The perianal and digital rectal examinations were normal.      Non-bleeding internal hemorrhoids were found during endoscopy.      The exam was otherwise without abnormality. Impression:               - Non-bleeding internal hemorrhoids.                           - The examination was otherwise normal.                           - No specimens collected. Moderate Sedation:      Per Anesthesia Care Recommendation:           - Patient has a contact number available for                            emergencies. The signs and symptoms of potential                            delayed complications were discussed with the                            patient. Return to normal activities tomorrow.                            Written discharge instructions were provided to the                            patient.                           - Resume previous diet.                           -  Continue present medications.                           - Repeat colonoscopy in 10 years for screening                            purposes.                           - Return to GI clinic PRN. Procedure Code(s):        --- Professional ---                           X0940, Colorectal cancer screening; colonoscopy on                            individual at high risk Diagnosis Code(s):        --- Professional ---                           Z86.010, Personal history of colonic polyps                           K64.8, Other hemorrhoids CPT copyright 2019 American Medical Association. All rights reserved. The codes documented in this report are preliminary and upon coder review may  be revised to meet current  compliance requirements. Elon Alas. Abbey Chatters, DO [place] Abbey Chatters, DO 07/22/2021 8:59:06 AM This report has been signed electronically. Number of Addenda: 0

## 2021-07-22 NOTE — Transfer of Care (Signed)
Immediate Anesthesia Transfer of Care Note  Patient: Sarah Porter  Procedure(s) Performed: COLONOSCOPY WITH PROPOFOL  Patient Location: Endoscopy Unit  Anesthesia Type:General  Level of Consciousness: awake  Airway & Oxygen Therapy: Patient Spontanous Breathing  Post-op Assessment: Report given to RN and Post -op Vital signs reviewed and stable  Post vital signs: Reviewed and stable  Last Vitals:  Vitals Value Taken Time  BP    Temp    Pulse    Resp    SpO2      Last Pain:  Vitals:   07/22/21 0846  TempSrc:   PainSc: 3       Patients Stated Pain Goal: 8 (07/22/21 0715)  Complications: No notable events documented.

## 2021-07-23 ENCOUNTER — Encounter (HOSPITAL_COMMUNITY): Payer: Self-pay | Admitting: Internal Medicine

## 2021-08-17 DIAGNOSIS — M79671 Pain in right foot: Secondary | ICD-10-CM | POA: Diagnosis not present

## 2021-08-17 DIAGNOSIS — G8929 Other chronic pain: Secondary | ICD-10-CM | POA: Diagnosis not present

## 2021-08-17 MED ORDER — METHYLPREDNISOLONE ACETATE 40 MG/ML IJ SUSP
40.0000 mg | INTRAMUSCULAR | Status: AC | PRN
Start: 2021-08-17 — End: 2021-08-17
  Administered 2021-08-17: 16:00:00 40 mg via INTRA_ARTICULAR

## 2021-08-17 MED ORDER — BUPIVACAINE HCL 0.5 % IJ SOLN
3.0000 mL | INTRAMUSCULAR | Status: AC | PRN
Start: 2021-08-17 — End: 2021-08-17
  Administered 2021-08-17: 16:00:00 3 mL via INTRA_ARTICULAR

## 2021-08-17 NOTE — Progress Notes (Signed)
Sarah Porter - 60 y.o. female MRN [id number]  Date of birth: 1961-08-04  Office Visit Note: Visit Date: 06/26/2021 PCP: Waldon Reining, MD Referred by: Waldon Reining, MD  Subjective: Chief Complaint  Patient presents with   Right Foot - Pain   HPI:  Sarah Porter is a 60 y.o. female who comes in today at the request of Dr. Norlene Campbell for planned Right anesthetictalonavicular arthrogram with fluoroscopic guidance.  The patient has failed conservative care including home exercise, medications, time and activity modification.  This injection will be diagnostic and hopefully therapeutic.  Please see requesting physician notes for further details and justification.  ROS Otherwise per HPI.  Assessment & Plan: Visit Diagnoses:    ICD-10-CM   1. Chronic foot pain, right  M79.671 XR C-ARM NO REPORT   G89.29       Plan: No additional findings.   Meds & Orders: No orders of the defined types were placed in this encounter.   Orders Placed This Encounter  Procedures   Large Joint Inj   XR C-ARM NO REPORT    Follow-up: Return for visit to requesting provider as needed.   Procedures: Right talonavicular Large Joint Inj on 08/17/2021 3:43 PM Indications: pain and diagnostic evaluation Details: 22 G 3.5 in needle, fluoroscopy-guided anterior approach  Arthrogram: No  Medications: 40 mg methylPREDNISolone acetate 40 MG/ML; 3 mL bupivacaine 0.5 % Outcome: tolerated well, no immediate complications  Biplanar fluoroscopy utilized to his needle tip into the talonavicular joint.  Excellent flow of contrast outlining the joint with no intravascular flow. Consent was given by the patient. Immediately prior to procedure a time out was called to verify the correct patient, procedure, equipment, support staff and site/side marked as required. Patient was prepped and draped in the usual sterile fashion.         Clinical History: EXAM: MRI LUMBAR SPINE WITHOUT CONTRAST    TECHNIQUE: Multiplanar, multisequence MR imaging of the lumbar spine was performed. No intravenous contrast was administered.   COMPARISON:  None.   FINDINGS: Segmentation:  Standard.   Alignment:  Normal.   Vertebrae: Normal bone marrow signal intensity. Scattered T1/T2 hyperintense foci may reflect hemangiomata versus focal fat.   Conus medullaris and cauda equina: Conus extends to the L2 level. Conus and cauda equina appear normal.   Disc levels: Multilevel desiccation and mild disc space loss.   L1-2: No significant disc bulge, spinal canal or neural foraminal narrowing.   L2-3: Mild disc bulge. Left extraforaminal annular fissuring. Bilateral facet degenerative spurring. No significant spinal canal or neural foraminal narrowing.   L3-4: Disc bulge, mild ligamentum flavum and bilateral facet hypertrophy. Mild bilateral neural foraminal narrowing. Patent spinal canal.   L4-5: Disc bulge with superimposed right foraminal protrusion grazing the exiting right L4 nerve root. Bilateral facet hypertrophy. Patent spinal canal and left neural foramen. Mild right neural foraminal narrowing.   L5-S1: No significant disc bulge, spinal canal or neural foraminal narrowing.   Paraspinal and other soft tissues: Negative.   IMPRESSION: Mild bilateral L3-4 and right L4-5 neural foraminal narrowing.   No significant spinal canal narrowing.   Right L4-5 foraminal protrusion grazing the exiting right L4 nerve root.     Electronically Signed   By: Stana Bunting M.D.   On: 05/21/2020 10:19     Objective:  VS:  HT:     WT:    BMI:      BP:    HR: bpm   TEMP: ( )  RESP:  Physical Exam   Imaging: No results found.

## 2021-09-02 DIAGNOSIS — Z79899 Other long term (current) drug therapy: Secondary | ICD-10-CM | POA: Diagnosis not present

## 2021-09-02 DIAGNOSIS — F419 Anxiety disorder, unspecified: Secondary | ICD-10-CM | POA: Diagnosis not present

## 2021-09-02 DIAGNOSIS — R69 Illness, unspecified: Secondary | ICD-10-CM | POA: Diagnosis not present

## 2021-09-03 DIAGNOSIS — S338XXA Sprain of other parts of lumbar spine and pelvis, initial encounter: Secondary | ICD-10-CM | POA: Diagnosis not present

## 2021-09-03 DIAGNOSIS — S233XXA Sprain of ligaments of thoracic spine, initial encounter: Secondary | ICD-10-CM | POA: Diagnosis not present

## 2021-09-11 DIAGNOSIS — E785 Hyperlipidemia, unspecified: Secondary | ICD-10-CM | POA: Diagnosis not present

## 2021-09-11 DIAGNOSIS — Z79899 Other long term (current) drug therapy: Secondary | ICD-10-CM | POA: Diagnosis not present

## 2021-09-11 DIAGNOSIS — I10 Essential (primary) hypertension: Secondary | ICD-10-CM | POA: Diagnosis not present

## 2021-09-11 DIAGNOSIS — E559 Vitamin D deficiency, unspecified: Secondary | ICD-10-CM | POA: Diagnosis not present

## 2021-09-11 DIAGNOSIS — D126 Benign neoplasm of colon, unspecified: Secondary | ICD-10-CM | POA: Diagnosis not present

## 2021-09-11 DIAGNOSIS — E119 Type 2 diabetes mellitus without complications: Secondary | ICD-10-CM | POA: Diagnosis not present

## 2021-09-11 DIAGNOSIS — R69 Illness, unspecified: Secondary | ICD-10-CM | POA: Diagnosis not present

## 2021-09-11 DIAGNOSIS — E669 Obesity, unspecified: Secondary | ICD-10-CM | POA: Diagnosis not present

## 2021-09-24 DIAGNOSIS — S338XXA Sprain of other parts of lumbar spine and pelvis, initial encounter: Secondary | ICD-10-CM | POA: Diagnosis not present

## 2021-09-24 DIAGNOSIS — S233XXA Sprain of ligaments of thoracic spine, initial encounter: Secondary | ICD-10-CM | POA: Diagnosis not present

## 2021-10-15 DIAGNOSIS — S338XXA Sprain of other parts of lumbar spine and pelvis, initial encounter: Secondary | ICD-10-CM | POA: Diagnosis not present

## 2021-10-15 DIAGNOSIS — S233XXA Sprain of ligaments of thoracic spine, initial encounter: Secondary | ICD-10-CM | POA: Diagnosis not present

## 2021-10-20 DIAGNOSIS — D126 Benign neoplasm of colon, unspecified: Secondary | ICD-10-CM | POA: Diagnosis not present

## 2021-10-20 DIAGNOSIS — Z6835 Body mass index (BMI) 35.0-35.9, adult: Secondary | ICD-10-CM | POA: Diagnosis not present

## 2021-10-20 DIAGNOSIS — E559 Vitamin D deficiency, unspecified: Secondary | ICD-10-CM | POA: Diagnosis not present

## 2021-10-20 DIAGNOSIS — Z79899 Other long term (current) drug therapy: Secondary | ICD-10-CM | POA: Diagnosis not present

## 2021-10-20 DIAGNOSIS — E119 Type 2 diabetes mellitus without complications: Secondary | ICD-10-CM | POA: Diagnosis not present

## 2021-10-20 DIAGNOSIS — R69 Illness, unspecified: Secondary | ICD-10-CM | POA: Diagnosis not present

## 2021-10-20 DIAGNOSIS — E785 Hyperlipidemia, unspecified: Secondary | ICD-10-CM | POA: Diagnosis not present

## 2021-10-20 DIAGNOSIS — E669 Obesity, unspecified: Secondary | ICD-10-CM | POA: Diagnosis not present

## 2021-10-20 DIAGNOSIS — I10 Essential (primary) hypertension: Secondary | ICD-10-CM | POA: Diagnosis not present

## 2021-10-20 DIAGNOSIS — F900 Attention-deficit hyperactivity disorder, predominantly inattentive type: Secondary | ICD-10-CM | POA: Diagnosis not present

## 2021-11-04 DIAGNOSIS — R69 Illness, unspecified: Secondary | ICD-10-CM | POA: Diagnosis not present

## 2021-11-04 DIAGNOSIS — Z79899 Other long term (current) drug therapy: Secondary | ICD-10-CM | POA: Diagnosis not present

## 2021-11-04 DIAGNOSIS — F419 Anxiety disorder, unspecified: Secondary | ICD-10-CM | POA: Diagnosis not present

## 2021-11-05 ENCOUNTER — Other Ambulatory Visit (HOSPITAL_COMMUNITY)
Admission: RE | Admit: 2021-11-05 | Discharge: 2021-11-05 | Disposition: A | Discharge status: Home / Self Care | Payer: 59 | Source: Ambulatory Visit | Attending: Obstetrics & Gynecology | Admitting: Obstetrics & Gynecology

## 2021-11-05 ENCOUNTER — Encounter: Payer: Self-pay | Admitting: Obstetrics & Gynecology

## 2021-11-05 ENCOUNTER — Ambulatory Visit (INDEPENDENT_AMBULATORY_CARE_PROVIDER_SITE_OTHER): Payer: 59 | Admitting: Obstetrics & Gynecology

## 2021-11-05 ENCOUNTER — Other Ambulatory Visit: Payer: Self-pay

## 2021-11-05 VITALS — BP 128/84 | HR 71 | Ht 61.0 in | Wt 215.0 lb

## 2021-11-05 DIAGNOSIS — Z01419 Encounter for gynecological examination (general) (routine) without abnormal findings: Secondary | ICD-10-CM | POA: Insufficient documentation

## 2021-11-05 NOTE — Progress Notes (Signed)
? ?  WELL-WOMAN EXAMINATION ?Patient name: Sarah Porter MRN [id number]  Date of birth: [birth year] ?Chief Complaint:   ?Gynecologic Exam (Having trouble losing weight) ? ?History of Present Illness:   ?Sarah Porter is a 61 y.o. [phone] PM female being seen today for a routine well-woman exam.  ? ?Today she notes the following concerns: ? ?-Weight management: Currently part of weight watchers, going to the gym regularly.  Notes "yo-yo" weight loss and feels frustrated with the process. ? ?-External vaginal itching: Notes issues with dry skin, itching externally.  Denies discharge, odor or internal itching.  She does feel as though the itching is related to shaving. ? ? ?No LMP recorded. Patient is postmenopausal. ?Denies issues with her menses ?The current method of family planning is none.  ? ? ?Last pap Nov 2020.  ?Last mammogram: Aug 2022. ?Last colonoscopy: up to date ? ?Depression screen [facility] 2/9 11/05/2021  ?Decreased Interest 0  ?Down, Depressed, Hopeless 0  ?PHQ - 2 Score 0  ?Altered sleeping 1  ?Tired, decreased energy 1  ?Change in appetite 0  ?Feeling bad or failure about yourself  0  ?Trouble concentrating 0  ?Moving slowly or fidgety/restless 0  ?Suicidal thoughts 0  ?PHQ-9 Score 2  ? ? ? ? ?Review of Systems:   ?Pertinent items are noted in HPI ?Denies any headaches, blurred vision, fatigue, shortness of breath, chest pain, abdominal pain, bowel movements, urination, or intercourse unless otherwise stated above. ? ?Pertinent History Reviewed:  ?Reviewed past medical,surgical, social and family history.  ?Reviewed problem list, medications and allergies. ?Physical Assessment:  ? ?Vitals:  ? 11/05/21 0845  ?BP: 128/84  ?Pulse: 71  ?Weight: 97.5 kg  ?Height: 5\' 1"  (1.549 m)  ?Body mass index is 40.62 kg/m?. ?  ?     Physical Examination:  ? General appearance - well appearing, and in no distress ? Mental status - alert, oriented to person, place, and time ? Psych:  She has a normal mood and affect ? Skin - warm and  dry, normal color, no suspicious lesions noted ? Chest - effort normal, all lung fields clear to auscultation bilaterally ? Heart - normal rate and regular rhythm ? Neck:  midline trachea, no thyromegaly or nodules ? Breasts - breasts appear normal, no suspicious masses, no skin or nipple changes or  axillary nodes ? Abdomen - soft, nontender, nondistended, no masses or organomegaly ? Pelvic - VULVA: normal appearing vulva with no masses, tenderness or lesions  VAGINA: normal appearing vagina with normal color and discharge, no lesions  CERVIX: normal appearing cervix without discharge or lesions, no CMT ? Thin prep pap is done with HR HPV cotesting ? UTERUS: uterus is felt to be normal size, shape, consistency and nontender  ? ADNEXA: No adnexal masses or tenderness noted. ? Extremities:  No swelling or varicosities noted ? ?Chaperone:   ? ? ?Assessment & Plan:  ?1) Well-Woman Exam ?-pap collected, reviewed screening guidelines ?-other preventive screening up to date ? ?2) Weight management ?-encouraged pt to stick with current system ?-seems focused on the scale number and reviewed importance of other changes- improved musle tone, decreased A1c, etc. ?-if desired in the future may consider referral to Weight loss clinic ? ?Meds: No orders of the defined types were placed in this encounter. ? ? ?Follow-up: Return in about 1 year (around 11/06/2022) for Annual. ? ? ?01/06/2023, DO ?Attending Obstetrician & Gynecologist, Faculty Practice ?[facility] ?  ?

## 2021-11-06 DIAGNOSIS — S233XXA Sprain of ligaments of thoracic spine, initial encounter: Secondary | ICD-10-CM | POA: Diagnosis not present

## 2021-11-06 DIAGNOSIS — S338XXA Sprain of other parts of lumbar spine and pelvis, initial encounter: Secondary | ICD-10-CM | POA: Diagnosis not present

## 2021-11-06 LAB — CYTOLOGY - PAP
Comment: NEGATIVE
Diagnosis: NEGATIVE
High risk HPV: NEGATIVE

## 2021-11-19 ENCOUNTER — Ambulatory Visit (INDEPENDENT_AMBULATORY_CARE_PROVIDER_SITE_OTHER): Payer: 59 | Admitting: Orthopaedic Surgery

## 2021-11-19 ENCOUNTER — Ambulatory Visit: Payer: Self-pay

## 2021-11-19 ENCOUNTER — Encounter: Payer: Self-pay | Admitting: Orthopaedic Surgery

## 2021-11-19 ENCOUNTER — Other Ambulatory Visit: Payer: Self-pay

## 2021-11-19 ENCOUNTER — Ambulatory Visit (INDEPENDENT_AMBULATORY_CARE_PROVIDER_SITE_OTHER): Payer: 59

## 2021-11-19 VITALS — Ht 61.0 in | Wt 208.0 lb

## 2021-11-19 DIAGNOSIS — M25562 Pain in left knee: Secondary | ICD-10-CM

## 2021-11-19 DIAGNOSIS — M25561 Pain in right knee: Secondary | ICD-10-CM

## 2021-11-19 DIAGNOSIS — G8929 Other chronic pain: Secondary | ICD-10-CM | POA: Diagnosis not present

## 2021-11-19 NOTE — Progress Notes (Signed)
? ?Office Visit Note ?  ?Patient: Sarah Porter           ?Date of Birth: 01/09/1961           ?MRN: [id number] ?Visit Date: 11/19/2021 ?             ?Requested by: Waldon Reining, MD ?[address] ?[place],  [place] [address] ?PCP: Waldon Reining, MD ? ? ?Assessment & Plan: ?Visit Diagnoses:  ?1. Chronic pain of both knees   ? ? ?Plan: Patient with bilateral knee arthritis.  She has lived down here 3 years and has had her back and her foot injected.  She has not had her knees injected in 3 years but has had not done in [place].  She has done well with these injections requesting 1 today.  No new injuries.  She does have a history of diabetes but her most recent A1c is below 7 and she said she has had these injections bilaterally without difficulty ? ?Follow-Up Instructions: No follow-ups on file.  ? ?Orders:  ?Orders Placed This Encounter  ?Procedures  ? XR KNEE 3 VIEW LEFT  ? XR KNEE 3 VIEW RIGHT  ? ?No orders of the defined types were placed in this encounter. ? ? ? ? Procedures: ?No procedures performed ? ? ?Clinical Data: ?No additional findings. ? ? ?Subjective: ?Chief Complaint  ?Patient presents with  ? Left Knee - Pain  ? Right Knee - Pain  ?Patient presents today for bilateral knee pain. She moved here from [place] right before covid. She states that she use to get cortisone injections regularly while living there. She is wanting to get her knees injected today. She is diabetic. She does not take anything for pain because she cannot take NSAIDS due to early kidney disease.  ? ?HPI ? ?Review of Systems  ?All other systems reviewed and are negative. ? ? ?Objective: ?Vital Signs: There were no vitals taken for this visit. ? ?Physical Exam ?Constitutional:   ?   Appearance: Normal appearance.  ?Pulmonary:  ?   Effort: Pulmonary effort is normal.  ?Neurological:  ?   Mental Status: She is alert.  ?Psychiatric:     ?   Mood and Affect: Mood normal.     ?   Behavior: Behavior normal.  ? ? ?Ortho  Exam ?Bilateral knees no effusions previously well-healed arthroscopic scars.  No redness.  Right greater than left grinding with range of motion.  Lateral greater than medial joint line tenderness.  Good varus valgus stability distal CMS is intact ?Specialty Comments:  ?No specialty comments available. ? ?Imaging: ?No results found. ? ? ?PMFS History: ?Patient Active Problem List  ? Diagnosis Date Noted  ? H/O adenomatous polyp of colon 06/25/2021  ? Tibialis posterior tendinitis, right 05/22/2020  ? Herniated nucleus pulposus, L4-5 right 05/22/2020  ? Arthritis of right ankle 05/22/2020  ? Pain in right ankle and joints of right foot 05/08/2020  ? Low back pain 05/08/2020  ? Morbid (severe) obesity due to excess calories (HCC) 05/08/2020  ? ?Past Medical History:  ?Diagnosis Date  ? ADD (attention deficit disorder)   ? Anxiety   ? Depression   ? Essential hypertension   ? patient deies this  ? GERD (gastroesophageal reflux disease)   ? Hyperlipidemia   ? Type 2 diabetes mellitus (HCC)   ?  ?Family History  ?Problem Relation Age of Onset  ? AAA (abdominal aortic aneurysm) Mother   ? Asthma Sister   ?  Kidney cancer Sister   ?     had kidney removed  ? Diabetes Sister   ? Lymphoma Nephew   ? Colon cancer Neg Hx   ?  ?Past Surgical History:  ?Procedure Laterality Date  ? BILATERAL KNEE REPAIRS    ? CESAREAN SECTION    ? x 2  ? COLONOSCOPY  11/04/2011  ? Dr. Edrick Kins; diminutive colon polyp in the rectum, otherwise normal exam.  Pathology with tubular adenoma.  Recommended 5-year repeat.  ? COLONOSCOPY  07/12/2014  ? Dr. Edrick Kins; submucosal hemorrhages in proximal descending colon?  Prior ischemic or infectious colitis?,  Otherwise normal exam.  Recommended 5-year repeat.  ? COLONOSCOPY WITH PROPOFOL N/A 07/22/2021  ? Procedure: COLONOSCOPY WITH PROPOFOL;  Surgeon: Lanelle Bal, DO;  Location: [facility];  Service: Endoscopy;  Laterality: N/A;  8:45am  ? ESOPHAGOGASTRODUODENOSCOPY  07/21/2004  ? Dr.  Edrick Kins; grade 2 erosive esophagitis at EG junction, cervical inlet patch, otherwise normal exam.  ? ?Social History  ? ?Occupational History  ? Not on file  ?Tobacco Use  ? Smoking status: Former  ?  Types: Cigarettes  ?  Quit date: 09/01/1999  ?  Years since quitting: 22.2  ? Smokeless tobacco: Never  ? Tobacco comments:  ?  smoked for approx 20 years   ?Vaping Use  ? Vaping Use: Never used  ?Substance and Sexual Activity  ? Alcohol use: Not Currently  ? Drug use: Yes  ?  Types: Marijuana  ?  Comment: occasionally  ? Sexual activity: Yes  ?  Birth control/protection: Post-menopausal  ? ? ? ? ? ? ?

## 2021-11-20 DIAGNOSIS — S338XXA Sprain of other parts of lumbar spine and pelvis, initial encounter: Secondary | ICD-10-CM | POA: Diagnosis not present

## 2021-11-20 DIAGNOSIS — S233XXA Sprain of ligaments of thoracic spine, initial encounter: Secondary | ICD-10-CM | POA: Diagnosis not present

## 2021-11-27 DIAGNOSIS — Z7984 Long term (current) use of oral hypoglycemic drugs: Secondary | ICD-10-CM | POA: Diagnosis not present

## 2021-11-27 DIAGNOSIS — Z794 Long term (current) use of insulin: Secondary | ICD-10-CM | POA: Diagnosis not present

## 2021-11-27 DIAGNOSIS — E119 Type 2 diabetes mellitus without complications: Secondary | ICD-10-CM | POA: Diagnosis not present

## 2021-11-27 DIAGNOSIS — H2513 Age-related nuclear cataract, bilateral: Secondary | ICD-10-CM | POA: Diagnosis not present

## 2021-11-28 DIAGNOSIS — E119 Type 2 diabetes mellitus without complications: Secondary | ICD-10-CM | POA: Diagnosis not present

## 2021-11-28 DIAGNOSIS — Z6834 Body mass index (BMI) 34.0-34.9, adult: Secondary | ICD-10-CM | POA: Diagnosis not present

## 2021-11-28 DIAGNOSIS — E669 Obesity, unspecified: Secondary | ICD-10-CM | POA: Diagnosis not present

## 2021-11-28 DIAGNOSIS — R69 Illness, unspecified: Secondary | ICD-10-CM | POA: Diagnosis not present

## 2021-11-28 DIAGNOSIS — G4700 Insomnia, unspecified: Secondary | ICD-10-CM | POA: Diagnosis not present

## 2021-11-28 DIAGNOSIS — R5383 Other fatigue: Secondary | ICD-10-CM | POA: Diagnosis not present

## 2021-11-28 DIAGNOSIS — Z79899 Other long term (current) drug therapy: Secondary | ICD-10-CM | POA: Diagnosis not present

## 2021-11-28 DIAGNOSIS — I10 Essential (primary) hypertension: Secondary | ICD-10-CM | POA: Diagnosis not present

## 2021-11-28 DIAGNOSIS — R7989 Other specified abnormal findings of blood chemistry: Secondary | ICD-10-CM | POA: Diagnosis not present

## 2021-12-11 DIAGNOSIS — S233XXA Sprain of ligaments of thoracic spine, initial encounter: Secondary | ICD-10-CM | POA: Diagnosis not present

## 2021-12-11 DIAGNOSIS — S338XXA Sprain of other parts of lumbar spine and pelvis, initial encounter: Secondary | ICD-10-CM | POA: Diagnosis not present

## 2021-12-18 DIAGNOSIS — G2581 Restless legs syndrome: Secondary | ICD-10-CM | POA: Diagnosis not present

## 2021-12-18 DIAGNOSIS — E119 Type 2 diabetes mellitus without complications: Secondary | ICD-10-CM | POA: Diagnosis not present

## 2021-12-18 DIAGNOSIS — R5383 Other fatigue: Secondary | ICD-10-CM | POA: Diagnosis not present

## 2021-12-18 DIAGNOSIS — I10 Essential (primary) hypertension: Secondary | ICD-10-CM | POA: Diagnosis not present

## 2021-12-18 DIAGNOSIS — R7989 Other specified abnormal findings of blood chemistry: Secondary | ICD-10-CM | POA: Diagnosis not present

## 2021-12-18 DIAGNOSIS — G4700 Insomnia, unspecified: Secondary | ICD-10-CM | POA: Diagnosis not present

## 2021-12-18 DIAGNOSIS — E559 Vitamin D deficiency, unspecified: Secondary | ICD-10-CM | POA: Diagnosis not present

## 2021-12-18 DIAGNOSIS — E669 Obesity, unspecified: Secondary | ICD-10-CM | POA: Diagnosis not present

## 2021-12-18 DIAGNOSIS — Z79899 Other long term (current) drug therapy: Secondary | ICD-10-CM | POA: Diagnosis not present

## 2021-12-18 DIAGNOSIS — Z6834 Body mass index (BMI) 34.0-34.9, adult: Secondary | ICD-10-CM | POA: Diagnosis not present

## 2021-12-18 DIAGNOSIS — R69 Illness, unspecified: Secondary | ICD-10-CM | POA: Diagnosis not present

## 2021-12-30 DIAGNOSIS — F419 Anxiety disorder, unspecified: Secondary | ICD-10-CM | POA: Diagnosis not present

## 2021-12-30 DIAGNOSIS — R69 Illness, unspecified: Secondary | ICD-10-CM | POA: Diagnosis not present

## 2022-01-01 DIAGNOSIS — S233XXA Sprain of ligaments of thoracic spine, initial encounter: Secondary | ICD-10-CM | POA: Diagnosis not present

## 2022-01-01 DIAGNOSIS — S338XXA Sprain of other parts of lumbar spine and pelvis, initial encounter: Secondary | ICD-10-CM | POA: Diagnosis not present

## 2022-01-12 DIAGNOSIS — R5383 Other fatigue: Secondary | ICD-10-CM | POA: Diagnosis not present

## 2022-01-12 DIAGNOSIS — G4700 Insomnia, unspecified: Secondary | ICD-10-CM | POA: Diagnosis not present

## 2022-01-12 DIAGNOSIS — I10 Essential (primary) hypertension: Secondary | ICD-10-CM | POA: Diagnosis not present

## 2022-01-12 DIAGNOSIS — E669 Obesity, unspecified: Secondary | ICD-10-CM | POA: Diagnosis not present

## 2022-01-12 DIAGNOSIS — R69 Illness, unspecified: Secondary | ICD-10-CM | POA: Diagnosis not present

## 2022-01-12 DIAGNOSIS — R002 Palpitations: Secondary | ICD-10-CM | POA: Diagnosis not present

## 2022-01-12 DIAGNOSIS — E559 Vitamin D deficiency, unspecified: Secondary | ICD-10-CM | POA: Diagnosis not present

## 2022-01-12 DIAGNOSIS — Z79899 Other long term (current) drug therapy: Secondary | ICD-10-CM | POA: Diagnosis not present

## 2022-01-12 DIAGNOSIS — Z6834 Body mass index (BMI) 34.0-34.9, adult: Secondary | ICD-10-CM | POA: Diagnosis not present

## 2022-01-12 DIAGNOSIS — F900 Attention-deficit hyperactivity disorder, predominantly inattentive type: Secondary | ICD-10-CM | POA: Diagnosis not present

## 2022-01-12 DIAGNOSIS — E119 Type 2 diabetes mellitus without complications: Secondary | ICD-10-CM | POA: Diagnosis not present

## 2022-01-12 DIAGNOSIS — G2581 Restless legs syndrome: Secondary | ICD-10-CM | POA: Diagnosis not present

## 2022-01-22 DIAGNOSIS — E119 Type 2 diabetes mellitus without complications: Secondary | ICD-10-CM | POA: Diagnosis not present

## 2022-01-22 DIAGNOSIS — Z79899 Other long term (current) drug therapy: Secondary | ICD-10-CM | POA: Diagnosis not present

## 2022-01-22 DIAGNOSIS — R69 Illness, unspecified: Secondary | ICD-10-CM | POA: Diagnosis not present

## 2022-01-22 DIAGNOSIS — R002 Palpitations: Secondary | ICD-10-CM | POA: Diagnosis not present

## 2022-01-22 DIAGNOSIS — G4700 Insomnia, unspecified: Secondary | ICD-10-CM | POA: Diagnosis not present

## 2022-01-22 DIAGNOSIS — R5383 Other fatigue: Secondary | ICD-10-CM | POA: Diagnosis not present

## 2022-01-22 DIAGNOSIS — I10 Essential (primary) hypertension: Secondary | ICD-10-CM | POA: Diagnosis not present

## 2022-01-22 DIAGNOSIS — E559 Vitamin D deficiency, unspecified: Secondary | ICD-10-CM | POA: Diagnosis not present

## 2022-01-22 DIAGNOSIS — E669 Obesity, unspecified: Secondary | ICD-10-CM | POA: Diagnosis not present

## 2022-01-22 DIAGNOSIS — R600 Localized edema: Secondary | ICD-10-CM | POA: Diagnosis not present

## 2022-01-23 DIAGNOSIS — S338XXA Sprain of other parts of lumbar spine and pelvis, initial encounter: Secondary | ICD-10-CM | POA: Diagnosis not present

## 2022-01-23 DIAGNOSIS — S233XXA Sprain of ligaments of thoracic spine, initial encounter: Secondary | ICD-10-CM | POA: Diagnosis not present

## 2022-02-02 IMAGING — MG MM DIGITAL SCREENING BILAT W/ TOMO AND CAD
8 series · 8 of 24 positions shown · non-contrast
Comparison: Previous exam(s).

CLINICAL DATA: Screening.

EXAM:
DIGITAL SCREENING BILATERAL MAMMOGRAM WITH TOMOSYNTHESIS AND CAD
TECHNIQUE: Bilateral screening digital craniocaudal and mediolateral oblique
mammograms were obtained. Bilateral screening digital breast
tomosynthesis was performed. The images were evaluated with
computer-aided detection.

[R CC synth-2D]
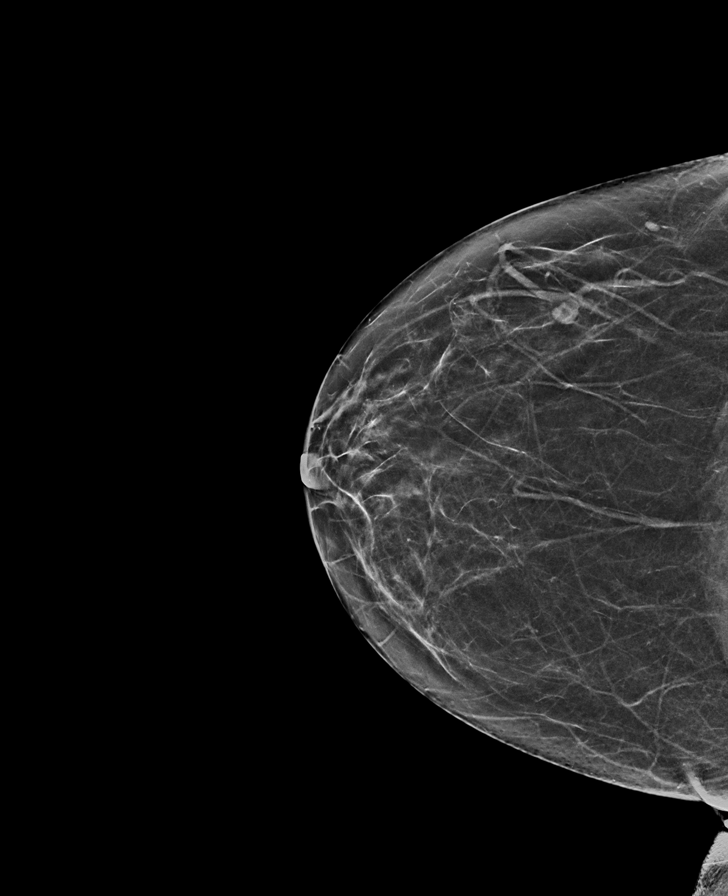

[L CC synth-2D]
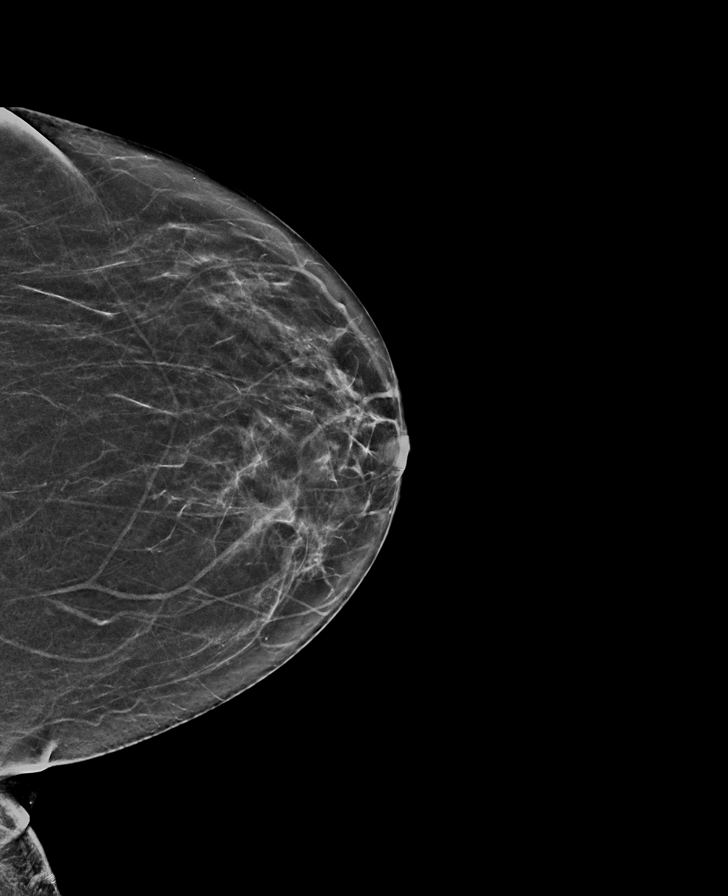

[L MLO synth-2D]
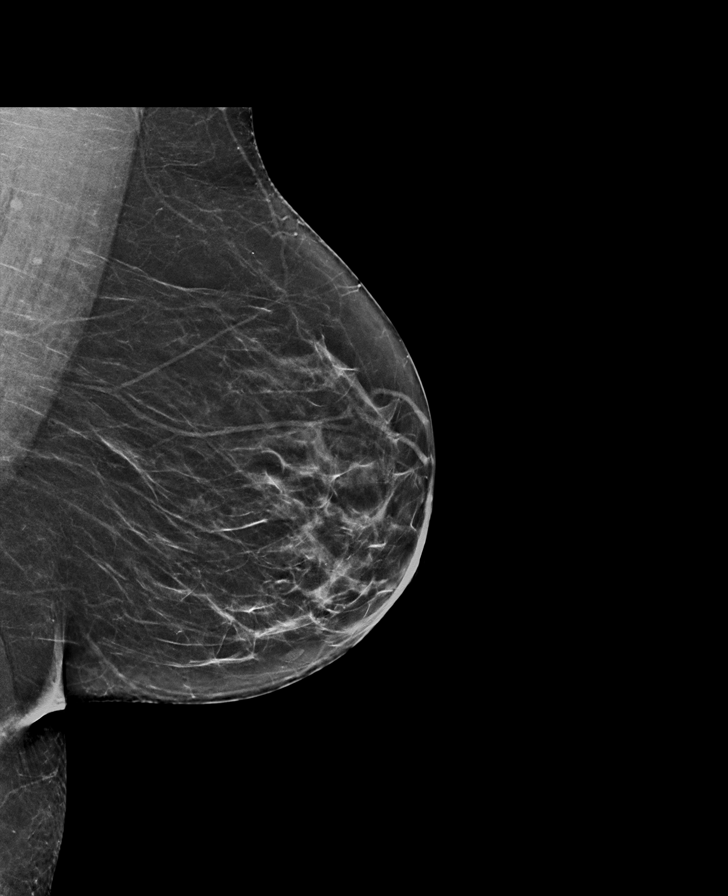

[R MLO synth-2D]
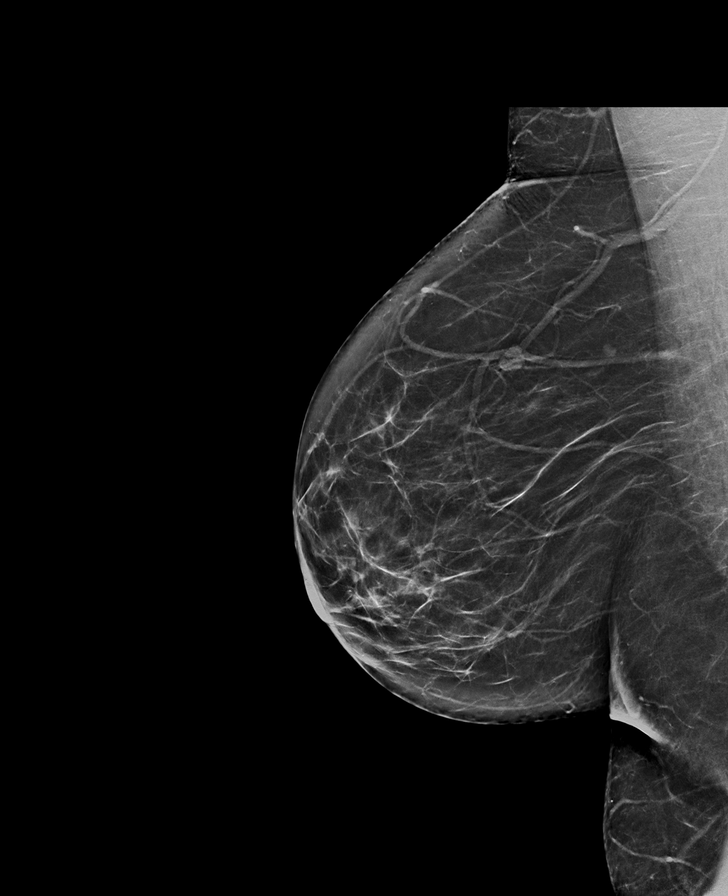

[L MLO tomo · tomo slice 31/62.0]
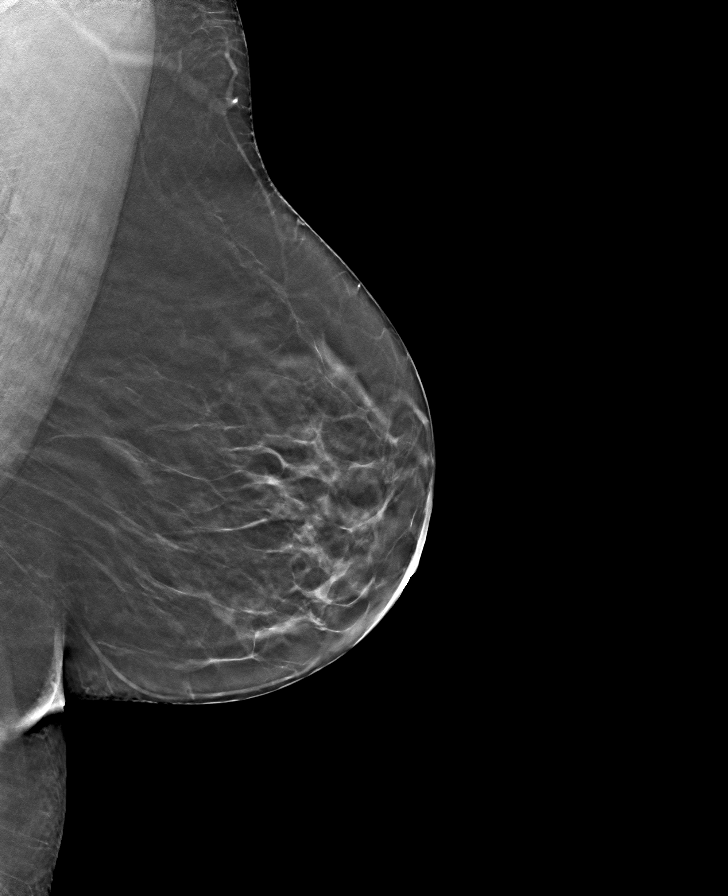

[R MLO tomo · tomo slice 37/72.0]
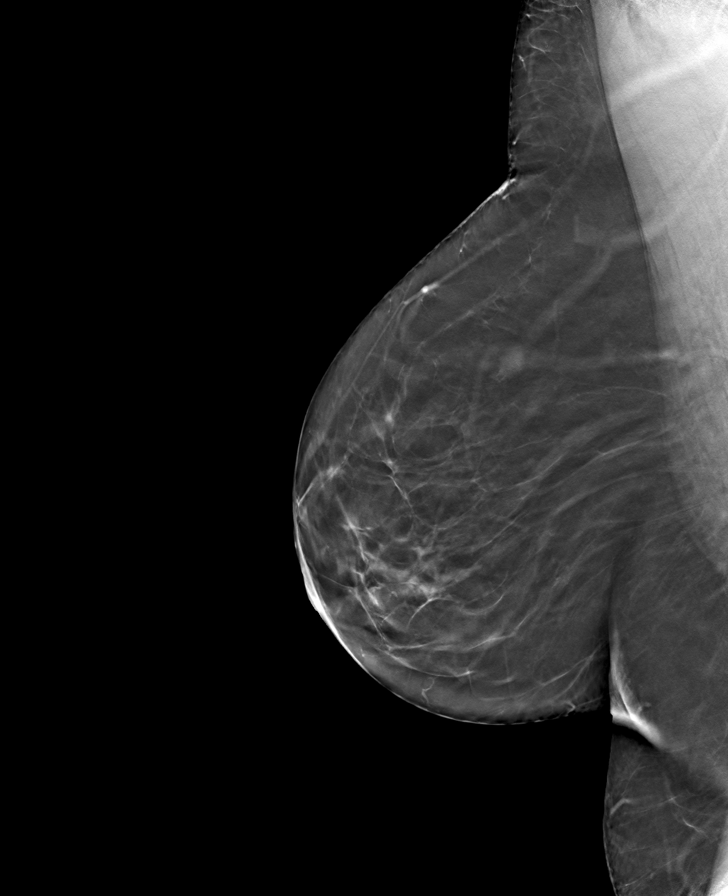

[R CC tomo · tomo slice 30/59.0]
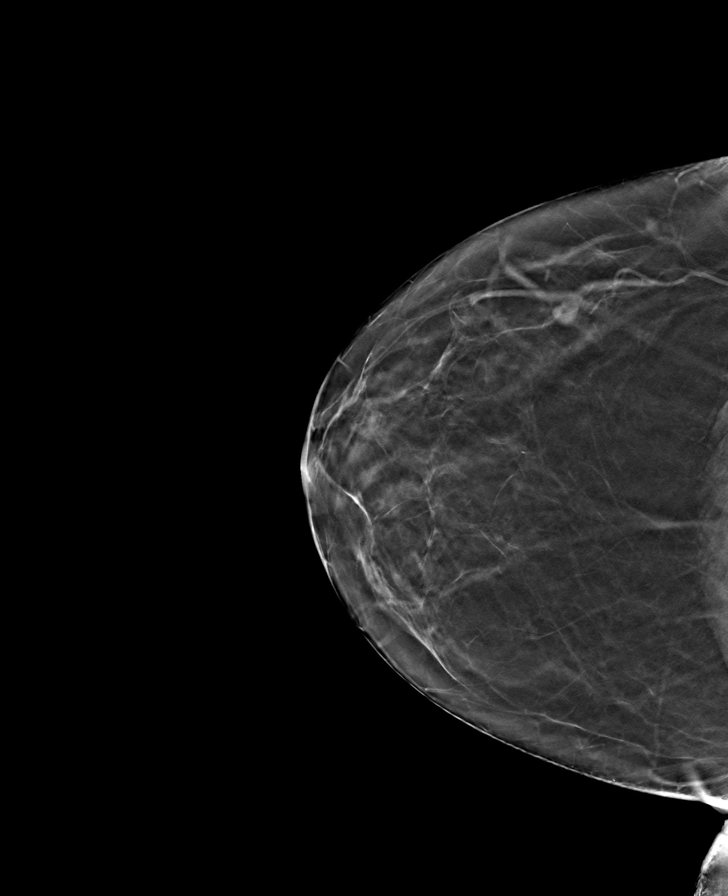

[L CC tomo · tomo slice 29/57.0]
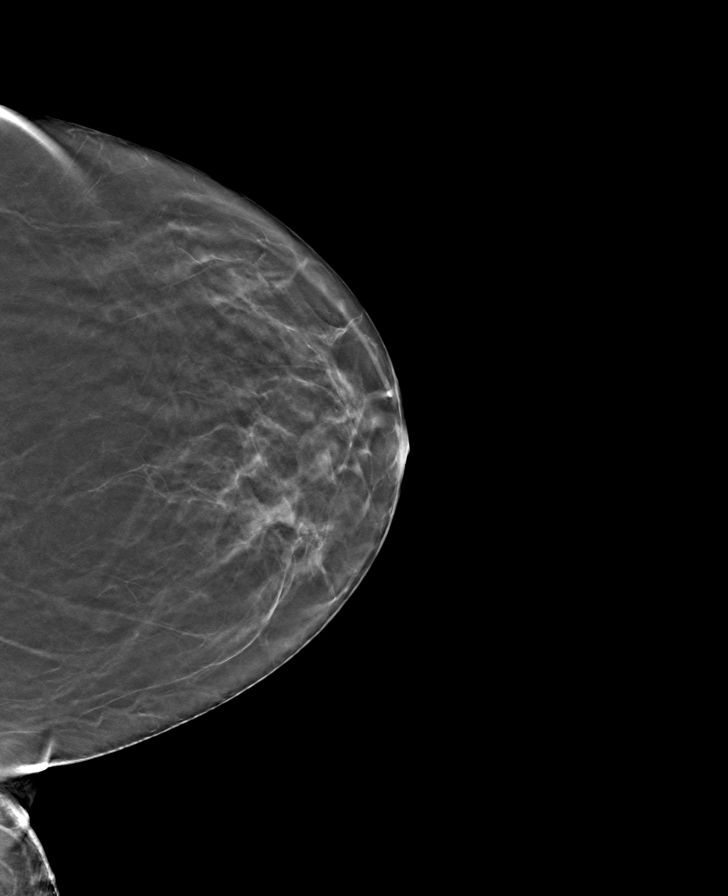

[8 of 24 positions shown; findings below may reference images not displayed]

ACR Breast Density Category b: There are scattered areas of
fibroglandular density.
FINDINGS: There are no findings suspicious for malignancy.
IMPRESSION: No mammographic evidence of malignancy. A result letter of this
screening mammogram will be mailed directly to the patient.

RECOMMENDATION:
Screening mammogram in one year. (Code:51-O-LD2)

BI-RADS CATEGORY  1: Negative.

## 2022-02-06 DIAGNOSIS — S338XXA Sprain of other parts of lumbar spine and pelvis, initial encounter: Secondary | ICD-10-CM | POA: Diagnosis not present

## 2022-02-06 DIAGNOSIS — S233XXA Sprain of ligaments of thoracic spine, initial encounter: Secondary | ICD-10-CM | POA: Diagnosis not present

## 2022-02-13 DIAGNOSIS — R69 Illness, unspecified: Secondary | ICD-10-CM | POA: Diagnosis not present

## 2022-02-13 DIAGNOSIS — F419 Anxiety disorder, unspecified: Secondary | ICD-10-CM | POA: Diagnosis not present

## 2022-02-25 NOTE — Progress Notes (Unsigned)
Cardiology Office Note    Date:  02/26/2022   ID:  Sarah Porter, DOB 02-12-1961, MRN [id number]  PCP:  Waldon Reining, MD  Cardiologist: [name], MD    Chief Complaint  Patient presents with   Follow-up    Palpitations    History of Present Illness:    Sarah Porter is a 61 y.o. female with past medical history of palpitations, HLD and Type II DM who presents to the office today for evaluation of worsening palpitations.  She was last examined by Dr. Diona Browner in 10/2019 for palpitations and had initially been placed on Atenolol by her PCP but developed lower extremity swelling with this and was switched to Toprol-XL. She felt like her symptoms had improved but were still present on a daily basis. A 7-day Zio patch was recommended for further evaluation. This showed predominantly normal sinus rhythm with an average heart rate of 74 bpm.  She did have rare PAC's and PVC's representing less than 1% of total beats and brief SVT lasting for only 6 beats.  In talking with the patient today, she reports having more frequent palpitations over the past several months.  Says her episodes can last for up to 20 to 30 minutes and typically occur at rest. She sometimes feels heavy in her chest when this occurs and has episodic dizziness as well. She consumes 1 to 2 cups of coffee daily and does not consume alcohol. Reports the heaviness can occur at rest in absence of the palpitations as well but she has been exercising at the [facility] several days a week by doing water aerobics and lifting weights and denies any chest pain or progressive dyspnea on exertion with routine activities. She has lost almost 30 pounds given her activity and dietary changes. No specific orthopnea, PND or pitting edema.   Past Medical History:  Diagnosis Date   ADD (attention deficit disorder)    Anxiety    Depression    Essential hypertension    patient deies this   GERD (gastroesophageal reflux disease)     Hyperlipidemia    Type 2 diabetes mellitus (HCC)     Past Surgical History:  Procedure Laterality Date   BILATERAL KNEE REPAIRS     CESAREAN SECTION     x 2   COLONOSCOPY  11/04/2011   Dr. Edrick Kins; diminutive colon polyp in the rectum, otherwise normal exam.  Pathology with tubular adenoma.  Recommended 5-year repeat.   COLONOSCOPY  07/12/2014   Dr. Edrick Kins; submucosal hemorrhages in proximal descending colon?  Prior ischemic or infectious colitis?,  Otherwise normal exam.  Recommended 5-year repeat.   COLONOSCOPY WITH PROPOFOL N/A 07/22/2021   Procedure: COLONOSCOPY WITH PROPOFOL;  Surgeon: Lanelle Bal, DO;  Location: [facility];  Service: Endoscopy;  Laterality: N/A;  8:45am   ESOPHAGOGASTRODUODENOSCOPY  07/21/2004   Dr. Edrick Kins; grade 2 erosive esophagitis at EG junction, cervical inlet patch, otherwise normal exam.    Current Medications: Outpatient Medications Prior to Visit  Medication Sig Dispense Refill   acetaminophen (TYLENOL) 500 MG tablet Take 1,000 mg by mouth every 6 (six) hours as needed (for pain.).     ARIPiprazole (ABILIFY) 5 MG tablet Take 1 mg by mouth in the morning.     atorvastatin (LIPITOR) 40 MG tablet Take 40 mg by mouth in the morning.     Cholecalciferol (VITAMIN D3) 50 MCG (2000 UT) TABS Take 2,000 Units by mouth in the morning.     Dulaglutide (TRULICITY)  0.75 MG/0.5ML SOPN Inject into the skin once a week.     FLUoxetine (PROZAC) 40 MG capsule Take 40 mg by mouth daily.     lisinopril (ZESTRIL) 2.5 MG tablet Take 2.5 mg by mouth in the morning.     metFORMIN (GLUCOPHAGE) 500 MG tablet Take 500 mg by mouth daily with breakfast.     metoprolol succinate (TOPROL-XL) 25 MG 24 hr tablet Take 25 mg by mouth in the morning.     Multiple Vitamin (MULTIVITAMIN WITH MINERALS) TABS tablet Take 1 tablet by mouth in the morning.     pregabalin (LYRICA) 25 MG capsule Take by mouth.     vitamin C (ASCORBIC ACID) 500 MG tablet Take 500 mg by  mouth in the morning.     VITAMIN D PO Take 50 mcg by mouth.     amphetamine-dextroamphetamine (ADDERALL) 20 MG tablet Take 10 mg by mouth 2 (two) times daily.     FLUoxetine (PROZAC) 20 MG capsule Take 30 mg by mouth in the morning.     No facility-administered medications prior to visit.     Allergies:   Patient has no known allergies.   Social History   Socioeconomic History   Marital status: Significant Other    Spouse name: Not on file   Number of children: Not on file   Years of education: Not on file   Highest education level: Not on file  Occupational History   Not on file  Tobacco Use   Smoking status: Former    Types: Cigarettes    Quit date: 09/01/1999    Years since quitting: 22.5   Smokeless tobacco: Never   Tobacco comments:    smoked for approx 20 years   Vaping Use   Vaping Use: Never used  Substance and Sexual Activity   Alcohol use: Not Currently   Drug use: Yes    Types: Marijuana    Comment: occasionally   Sexual activity: Yes    Birth control/protection: Post-menopausal  Other Topics Concern   Not on file  Social History Narrative   Not on file   Social Determinants of Health   Financial Resource Strain: Low Risk  (11/05/2021)   Overall Financial Resource Strain (CARDIA)    Difficulty of Paying Living Expenses: Not hard at all  Food Insecurity: No Food Insecurity (11/05/2021)   Hunger Vital Sign    Worried About Running Out of Food in the Last Year: Never true    Ran Out of Food in the Last Year: Never true  Transportation Needs: No Transportation Needs (11/05/2021)   PRAPARE - Administrator, Civil Service (Medical): No    Lack of Transportation (Non-Medical): No  Physical Activity: Sufficiently Active (11/05/2021)   Exercise Vital Sign    Days of Exercise per Week: 5 days    Minutes of Exercise per Session: 90 min  Stress: Stress Concern Present (11/05/2021)   [employer] of Occupational Health - Occupational Stress Questionnaire     Feeling of Stress : To some extent  Social Connections: Socially Integrated (11/05/2021)   Social Connection and Isolation Panel [NHANES]    Frequency of Communication with Friends and Family: More than three times a week    Frequency of Social Gatherings with Friends and Family: Three times a week    Attends Religious Services: More than 4 times per year    Active Member of Clubs or Organizations: Yes    Attends Banker Meetings: More than 4  times per year    Marital Status: Living with partner     Family History:  The patient's family history includes AAA (abdominal aortic aneurysm) in her mother; Asthma in her sister; Diabetes in her sister; Kidney cancer in her sister; Lymphoma in her nephew.   Review of Systems:    Please see the history of present illness.     All other systems reviewed and are otherwise negative except as noted above.   Physical Exam:    VS:  BP 114/70   Pulse 66   Ht 5\' 1"  (1.549 m)   Wt 191 lb (86.6 kg)   BMI 36.09 kg/m    General: Pleasant female appearing in no acute distress. Head: Normocephalic, atraumatic. Neck: No carotid bruits. JVD not elevated.  Lungs: Respirations regular and unlabored, without wheezes or rales.  Heart: Regular rate and rhythm. No S3 or S4.  No murmur, no rubs, or gallops appreciated. Abdomen: Appears non-distended. No obvious abdominal masses. Msk:  Strength and tone appear normal for age. No obvious joint deformities or effusions. Extremities: No clubbing or cyanosis. No pitting edema.  Distal pedal pulses are 2+ bilaterally. Neuro: Alert and oriented X 3. Moves all extremities spontaneously. No focal deficits noted. Psych:  Responds to questions appropriately with a normal affect. Skin: No rashes or lesions noted  Wt Readings from Last 3 Encounters:  02/26/22 191 lb (86.6 kg)  11/19/21 208 lb (94.3 kg)  11/05/21 215 lb (97.5 kg)     Studies/Labs Reviewed:   EKG:  EKG is ordered today.  The ekg  ordered today demonstrates NSR, HR 67 with no acute ST changes.   Recent Labs: No results found for requested labs within last 365 days.   Lipid Panel No results found for: "CHOL", "TRIG", "HDL", "CHOLHDL", "VLDL", "LDLCALC", "LDLDIRECT"  Additional studies/ records that were reviewed today include:   Event Monitor: 10/2019 Zio patch reviewed.  6 days 22 hours analyzed.  Predominant rhythm is sinus with heart rate ranging from 50 bpm up to 114 bpm with average heart rate 74 bpm.  There were rare PACs and PVCs representing less than 1% of total beats.  Brief episode of SVT lasting 6 beats, not associated with patient triggered event.  Other described symptoms per patient triggering did not clearly correlate with any significant arrhythmia or unusual degree of ectopy.  No sustained arrhythmias or clinically significant heart block.  Assessment:    1. Palpitations   2. Atypical chest pain   3. Mixed hyperlipidemia      Plan:   In order of problems listed above:  1. Palpitations - She has a history of PAC's, PVC's and brief SVT by prior monitoring but reports more frequent symptoms and her episodes are now lasting for up to 30 minutes at a time and can occur several days per week. Labs were recently obtained by her PCP and reassuring. Reviewed options with the patient in regards to obtaining a repeat monitor vs. titration of beta-blocker therapy and will plan for a repeat 7-day ZIO monitor initially to assess for arrhythmias. Pending monitor results, would anticipate titration of Toprol-XL from 25 mg daily to 37.5 mg daily. If she does not experience improvement with this, could consider Cardizem given her intolerance to other beta-blockers in the past.  2. Atypical Chest Pain - She reports feeling heavy in her chest at times but this typically occurs in the setting of palpitations. She has been exercising routinely and denies any exertional chest pain with  this. Pending monitor results,  would consider either an echocardiogram or Coronary CT for further evaluation and this was reviewed with the patient today as she does have cardiac risk factors.   3. HLD - Followed by her PCP. She remains on Atorvastatin 40 mg daily.   Medication Adjustments/Labs and Tests Ordered: Current medicines are reviewed at length with the patient today.  Concerns regarding medicines are outlined above.  Medication changes, Labs and Tests ordered today are listed in the Patient Instructions below. Patient Instructions  Medication Instructions:  Your physician recommends that you continue on your current medications as directed. Please refer to the Current Medication list given to you today.  *If you need a refill on your cardiac medications before your next appointment, please call your pharmacy*   Lab Work: NONE   If you have labs (blood work) drawn today and your tests are completely normal, you will receive your results only by: MyChart Message (if you have MyChart) OR A paper copy in the mail If you have any lab test that is abnormal or we need to change your treatment, we will call you to review the results.   Testing/Procedures: Christena DeemZIO XT- Long Term Monitor Instructions   Your physician has requested you wear your ZIO patch monitor___7___days.   This is a single patch monitor.  Irhythm supplies one patch monitor per enrollment.  Additional stickers are not available.   Please do not apply patch if you will be having a Nuclear Stress Test, Echocardiogram, Cardiac CT, MRI, or Chest Xray during the time frame you would be wearing the monitor. The patch cannot be worn during these tests.  You cannot remove and re-apply the ZIO XT patch monitor.   Your ZIO patch monitor will be sent USPS Priority mail from [facility] directly to your home address. The monitor may also be mailed to a PO BOX if home delivery is not available.   It may take 3-5 days to receive your monitor after you  have been enrolled.   Once you have received you monitor, please review enclosed instructions.  Your monitor has already been registered assigning a specific monitor serial # to you.   Applying the monitor   Shave hair from upper left chest.   Hold abrader disc by orange tab.  Rub abrader in 40 strokes over left upper chest as indicated in your monitor instructions.   Clean area with 4 enclosed alcohol pads .  Use all pads to assure are is cleaned thoroughly.  Let dry.   Apply patch as indicated in monitor instructions.  Patch will be place under collarbone on left side of chest with arrow pointing upward.   Rub patch adhesive wings for 2 minutes.Remove white label marked "1".  Remove white label marked "2".  Rub patch adhesive wings for 2 additional minutes.   While looking in a mirror, press and release button in center of patch.  A small Tamer light will flash 3-4 times .  This will be your only indicator the monitor has been turned on.     Do not shower for the first 24 hours.  You may shower after the first 24 hours.   Press button if you feel a symptom. You will hear a small click.  Record Date, Time and Symptom in the Patient Log Book.   When you are ready to remove patch, follow instructions on last 2 pages of Patient Log Book.  Stick patch monitor onto last page of Patient Log  Book.   Place Patient Log Book in [place] box.  Use locking tab on box and tape box closed securely.  The Orange and [employer] has [employer] on it.  Please place in mailbox as soon as possible.  Your physician should have your test results approximately 7 days after the monitor has been mailed back to [facility].   Call [facility] Customer Care at [phone] if you have questions regarding your ZIO XT patch monitor.  Call them immediately if you see an orange light blinking on your monitor.   If your monitor falls off in less than 4 days contact our Monitor department at [phone].  If your  monitor becomes loose or falls off after 4 days call [employer] at [phone] for suggestions on securing your monitor.     Follow-Up: At [facility], you and your health needs are our priority.  As part of our continuing mission to provide you with exceptional heart care, we have created designated Provider Care Teams.  These Care Teams include your primary Cardiologist (physician) and Advanced Practice Providers (APPs -  Physician Assistants and Nurse Practitioners) who all work together to provide you with the care you need, when you need it.  We recommend signing up for the patient portal called "MyChart".  Sign up information is provided on this After Visit Summary.  MyChart is used to connect with patients for Virtual Visits (Telemedicine).  Patients are able to view lab/test results, encounter notes, upcoming appointments, etc.  Non-urgent messages can be sent to your provider as well.   To learn more about what you can do with MyChart, go to [email or web address].    Your next appointment:   2 -3 month(s)  The format for your next appointment:   In Person  Provider:   [name], MD    Other Instructions Thank you for choosing [facility]!    Important Information About Sugar         Signed, Ellsworth Lennox, PA-C  02/26/2022 5:35 PM     [facility] [address] [place], [place] [address] Phone: [phone] Fax: [phone]

## 2022-02-26 ENCOUNTER — Encounter: Payer: Self-pay | Admitting: Student

## 2022-02-26 ENCOUNTER — Ambulatory Visit: Payer: 59 | Admitting: Student

## 2022-02-26 ENCOUNTER — Ambulatory Visit (INDEPENDENT_AMBULATORY_CARE_PROVIDER_SITE_OTHER): Payer: 59

## 2022-02-26 VITALS — BP 114/70 | HR 66 | Ht 61.0 in | Wt 191.0 lb

## 2022-02-26 DIAGNOSIS — R002 Palpitations: Secondary | ICD-10-CM

## 2022-02-26 DIAGNOSIS — E782 Mixed hyperlipidemia: Secondary | ICD-10-CM | POA: Diagnosis not present

## 2022-02-26 DIAGNOSIS — R0789 Other chest pain: Secondary | ICD-10-CM | POA: Diagnosis not present

## 2022-02-26 NOTE — Patient Instructions (Signed)
Medication Instructions:  Your physician recommends that you continue on your current medications as directed. Please refer to the Current Medication list given to you today.  *If you need a refill on your cardiac medications before your next appointment, please call your pharmacy*   Lab Work: NONE   If you have labs (blood work) drawn today and your tests are completely normal, you will receive your results only by: Sarah Porter (if you have MyChart) OR A paper copy in the mail If you have any lab test that is abnormal or we need to change your treatment, we will call you to review the results.   Testing/Procedures: [name]- Long Term Monitor Instructions   Your physician has requested you wear your ZIO patch monitor___7___days.   This is a single patch monitor.  Irhythm supplies one patch monitor per enrollment.  Additional stickers are not available.   Please do not apply patch if you will be having a Nuclear Stress Test, Echocardiogram, Cardiac CT, MRI, or Chest Xray during the time frame you would be wearing the monitor. The patch cannot be worn during these tests.  You cannot remove and re-apply the ZIO XT patch monitor.   Your ZIO patch monitor will be sent USPS Priority mail from [facility] directly to your home address. The monitor may also be mailed to a PO BOX if home delivery is not available.   It may take 3-5 days to receive your monitor after you have been enrolled.   Once you have received you monitor, please review enclosed instructions.  Your monitor has already been registered assigning a specific monitor serial # to you.   Applying the monitor   Shave hair from upper left chest.   Hold abrader disc by orange tab.  Rub abrader in 40 strokes over left upper chest as indicated in your monitor instructions.   Clean area with 4 enclosed alcohol pads .  Use all pads to assure are is cleaned thoroughly.  Let dry.   Apply patch as indicated in monitor  instructions.  Patch will be place under collarbone on left side of chest with arrow pointing upward.   Rub patch adhesive wings for 2 minutes.Remove white label marked "1".  Remove white label marked "2".  Rub patch adhesive wings for 2 additional minutes.   While looking in a mirror, press and release button in center of patch.  A small Rhude light will flash 3-4 times .  This will be your only indicator the monitor has been turned on.     Do not shower for the first 24 hours.  You may shower after the first 24 hours.   Press button if you feel a symptom. You will hear a small click.  Record Date, Time and Symptom in the Patient Log Book.   When you are ready to remove patch, follow instructions on last 2 pages of Patient Log Book.  Stick patch monitor onto last page of Patient Log Book.   Place Patient Log Book in [place] box.  Use locking tab on box and tape box closed securely.  The Orange and [employer] has [employer] on it.  Please place in mailbox as soon as possible.  Your physician should have your test results approximately 7 days after the monitor has been mailed back to [facility].   Call [place] at [phone] if you have questions regarding your ZIO XT patch monitor.  Call them immediately if you see an orange  light blinking on your monitor.   If your monitor falls off in less than 4 days contact our Monitor department at [phone].  If your monitor becomes loose or falls off after 4 days call [employer] at [phone] for suggestions on securing your monitor.     Follow-Up: At [facility], you and your health needs are our priority.  As part of our continuing mission to provide you with exceptional heart care, we have created designated Provider Care Teams.  These Care Teams include your primary Cardiologist (physician) and Advanced Practice Providers (APPs -  Physician Assistants and Nurse Practitioners) who all work together to provide you with  the care you need, when you need it.  We recommend signing up for the patient portal called "MyChart".  Sign up information is provided on this After Visit Summary.  MyChart is used to connect with patients for Virtual Visits (Telemedicine).  Patients are able to view lab/test results, encounter notes, upcoming appointments, etc.  Non-urgent messages can be sent to your provider as well.   To learn more about what you can do with MyChart, go to [email or web address].    Your next appointment:   2 -3 month(s)  The format for your next appointment:   In Person  Provider:   [name], MD    Other Instructions Thank you for choosing [facility]!    Important Information About Sugar

## 2022-03-02 DIAGNOSIS — R002 Palpitations: Secondary | ICD-10-CM | POA: Diagnosis not present

## 2022-03-02 DIAGNOSIS — F329 Major depressive disorder, single episode, unspecified: Secondary | ICD-10-CM | POA: Diagnosis not present

## 2022-03-02 DIAGNOSIS — E785 Hyperlipidemia, unspecified: Secondary | ICD-10-CM | POA: Diagnosis not present

## 2022-03-02 DIAGNOSIS — R600 Localized edema: Secondary | ICD-10-CM | POA: Diagnosis not present

## 2022-03-02 DIAGNOSIS — R5383 Other fatigue: Secondary | ICD-10-CM | POA: Diagnosis not present

## 2022-03-02 DIAGNOSIS — Z713 Dietary counseling and surveillance: Secondary | ICD-10-CM | POA: Diagnosis not present

## 2022-03-02 DIAGNOSIS — Z7182 Exercise counseling: Secondary | ICD-10-CM | POA: Diagnosis not present

## 2022-03-02 DIAGNOSIS — Z79899 Other long term (current) drug therapy: Secondary | ICD-10-CM | POA: Diagnosis not present

## 2022-03-02 DIAGNOSIS — Z6832 Body mass index (BMI) 32.0-32.9, adult: Secondary | ICD-10-CM | POA: Diagnosis not present

## 2022-03-02 DIAGNOSIS — N182 Chronic kidney disease, stage 2 (mild): Secondary | ICD-10-CM | POA: Diagnosis not present

## 2022-03-02 DIAGNOSIS — E119 Type 2 diabetes mellitus without complications: Secondary | ICD-10-CM | POA: Diagnosis not present

## 2022-03-10 DIAGNOSIS — R42 Dizziness and giddiness: Secondary | ICD-10-CM | POA: Diagnosis not present

## 2022-03-10 DIAGNOSIS — R112 Nausea with vomiting, unspecified: Secondary | ICD-10-CM | POA: Diagnosis not present

## 2022-03-12 DIAGNOSIS — R002 Palpitations: Secondary | ICD-10-CM | POA: Diagnosis not present

## 2022-03-16 DIAGNOSIS — R69 Illness, unspecified: Secondary | ICD-10-CM | POA: Diagnosis not present

## 2022-03-16 DIAGNOSIS — F419 Anxiety disorder, unspecified: Secondary | ICD-10-CM | POA: Diagnosis not present

## 2022-03-31 ENCOUNTER — Other Ambulatory Visit (HOSPITAL_COMMUNITY): Payer: Self-pay | Admitting: Sports Medicine

## 2022-03-31 DIAGNOSIS — Z1231 Encounter for screening mammogram for malignant neoplasm of breast: Secondary | ICD-10-CM

## 2022-04-14 DIAGNOSIS — R69 Illness, unspecified: Secondary | ICD-10-CM | POA: Diagnosis not present

## 2022-04-14 DIAGNOSIS — F419 Anxiety disorder, unspecified: Secondary | ICD-10-CM | POA: Diagnosis not present

## 2022-04-16 DIAGNOSIS — Z79899 Other long term (current) drug therapy: Secondary | ICD-10-CM | POA: Diagnosis not present

## 2022-04-16 DIAGNOSIS — E785 Hyperlipidemia, unspecified: Secondary | ICD-10-CM | POA: Diagnosis not present

## 2022-04-16 DIAGNOSIS — E119 Type 2 diabetes mellitus without complications: Secondary | ICD-10-CM | POA: Diagnosis not present

## 2022-04-22 ENCOUNTER — Ambulatory Visit (HOSPITAL_COMMUNITY)
Admission: RE | Admit: 2022-04-22 | Discharge: 2022-04-22 | Disposition: A | Discharge status: Home / Self Care | Payer: 59 | Source: Ambulatory Visit | Attending: Sports Medicine | Admitting: Sports Medicine

## 2022-04-22 DIAGNOSIS — Z1231 Encounter for screening mammogram for malignant neoplasm of breast: Secondary | ICD-10-CM | POA: Insufficient documentation

## 2022-04-28 ENCOUNTER — Encounter: Payer: Self-pay | Admitting: Cardiology

## 2022-04-28 NOTE — Progress Notes (Unsigned)
Cardiology Office Note  Date: 04/29/2022   ID: Sarah Porter, DOB 1961/01/11, MRN [id number]  PCP:  Waldon Reining, MD  Cardiologist:  [name], MD Electrophysiologist:  None   Chief Complaint  Patient presents with   Cardiac follow-up    History of Present Illness: Sarah Porter is a 61 y.o. female last seen in June by Ms. Strader PA-C, I reviewed the note.  She is here for a routine visit.  Reports no progressive sense of palpitations, tolerating current dose of Toprol-XL.  She has been experiencing intermittent chest discomfort, describes a pressure or indigestion sensation in her central sternal area, also discomfort in her neck.  Interestingly, the symptoms are nonexertional.  She goes to the gym, water aerobics 3 days a week, weight workouts.  She has lost about 30 pounds over the last several months along with diet.  She has not undergone any recent ischemic testing.  I reviewed her interval cardiac monitor results.  I reviewed her medications as outlined below.  Past Medical History:  Diagnosis Date   ADD (attention deficit disorder)    Anxiety    Depression    Essential hypertension    GERD (gastroesophageal reflux disease)    Hyperlipidemia    Type 2 diabetes mellitus (HCC)    Vertigo     Past Surgical History:  Procedure Laterality Date   BILATERAL KNEE REPAIRS     CESAREAN SECTION     x 2   COLONOSCOPY  11/04/2011   Dr. Edrick Kins; diminutive colon polyp in the rectum, otherwise normal exam.  Pathology with tubular adenoma.  Recommended 5-year repeat.   COLONOSCOPY  07/12/2014   Dr. Edrick Kins; submucosal hemorrhages in proximal descending colon?  Prior ischemic or infectious colitis?,  Otherwise normal exam.  Recommended 5-year repeat.   COLONOSCOPY WITH PROPOFOL N/A 07/22/2021   Procedure: COLONOSCOPY WITH PROPOFOL;  Surgeon: Lanelle Bal, DO;  [facility]: [facility];  Service: Endoscopy;  Laterality: N/A;  8:45am    ESOPHAGOGASTRODUODENOSCOPY  07/21/2004   Dr. Edrick Kins; grade 2 erosive esophagitis at EG junction, cervical inlet patch, otherwise normal exam.    Current Outpatient Medications  Medication Sig Dispense Refill   acetaminophen (TYLENOL) 500 MG tablet Take 1,000 mg by mouth every 6 (six) hours as needed (for pain.).     ARIPiprazole (ABILIFY) 5 MG tablet Take 1 mg by mouth in the morning.     atorvastatin (LIPITOR) 40 MG tablet Take 40 mg by mouth in the morning.     Cholecalciferol (VITAMIN D3) 50 MCG (2000 UT) TABS Take 2,000 Units by mouth in the morning.     Dulaglutide (TRULICITY) 0.75 MG/0.5ML SOPN Inject into the skin once a week.     FLUoxetine (PROZAC) 40 MG capsule Take 40 mg by mouth daily.     lisinopril (ZESTRIL) 2.5 MG tablet Take 2.5 mg by mouth in the morning.     metFORMIN (GLUCOPHAGE) 500 MG tablet Take 500 mg by mouth daily with breakfast.     metoprolol succinate (TOPROL-XL) 25 MG 24 hr tablet Take 25 mg by mouth in the morning.     Multiple Vitamin (MULTIVITAMIN WITH MINERALS) TABS tablet Take 1 tablet by mouth in the morning.     pregabalin (LYRICA) 25 MG capsule Take by mouth.     vitamin C (ASCORBIC ACID) 500 MG tablet Take 500 mg by mouth in the morning.     VITAMIN D PO Take 50 mcg by mouth.  amphetamine-dextroamphetamine (ADDERALL) 20 MG tablet Take 10 mg by mouth 2 (two) times daily.     No current facility-administered medications for this visit.   Allergies:  Patient has no known allergies.   ROS: No unexplained syncope.  Physical Exam: VS:  BP 120/82   Pulse (!) 59   Ht 5\' 1"  (1.549 m)   Wt 197 lb 9.6 oz (89.6 kg)   SpO2 97%   BMI 37.34 kg/m , BMI Body mass index is 37.34 kg/m.  Wt Readings from Last 3 Encounters:  04/29/22 197 lb 9.6 oz (89.6 kg)  02/26/22 191 lb (86.6 kg)  11/19/21 208 lb (94.3 kg)    General: Patient appears comfortable at rest. HEENT: Conjunctiva and lids normal. Neck: Supple, no elevated JVP or carotid bruits, no  thyromegaly. Lungs: Clear to auscultation, nonlabored breathing at rest. Cardiac: Regular rate and rhythm, no S3 or significant systolic murmur, no pericardial rub. Extremities: No pitting edema.   ECG:  An ECG dated 02/26/2022 was personally reviewed today and demonstrated:  Sinus rhythm with borderline low voltage.  Recent Labwork:  December 2022: TSH 1.11, BUN 18, creatinine 0.77, potassium 4.4, AST 36, ALT 56, hemoglobin 13.7, platelets 334  Other Studies Reviewed Today:  Cardiac monitor July 2023: Zio XT reviewed. 6 days, 16 hours analyzed:   Predominant rhythm is sinus with prolonged PR interval. Heart rate ranged from 47 bpm up to 106 bpm with average heart rate 65 bpm. There were rare PACs including couplets and triplets representing less than 1% total beats. There were rare PVCs representing less than 1% total beats. Single, very brief episode of SVT noted lasting 6 beats. Very brief and rare dropped beats were noted (second degree type 1 or 2). No patient triggered events or diary entry associated with obvious arrhythmia.  Assessment and Plan:  1.  Palpitations, symptomatic control reasonable on current dose of Toprol-XL.  Interval cardiac monitor reviewed with rare atrial and ventricular ectopy, very brief episode of SVT.  No atrial fibrillation documented.  2.  Recurrent chest and jaw discomfort, atypical in that it does not occur with exertion, but also in the setting of hypertension, hyperlipidemia, and type 2 diabetes mellitus.  She has not undergone any recent ischemic testing.  We will arrange a Lexiscan Myoview.  Otherwise continue with present medications.  3.  Mixed hyperlipidemia, on Lipitor.  She continues to follow with PCP.  Medication Adjustments/Labs and Tests Ordered: Current medicines are reviewed at length with the patient today.  Concerns regarding medicines are outlined above.   Tests Ordered: Orders Placed This Encounter  Procedures   NM Myocar  Multi W/Spect W/Wall Motion / EF    Medication Changes: No orders of the defined types were placed in this encounter.   Disposition:  Follow up  6 months.  Signed, August 2023, MD, [facility] 04/29/2022 8:59 AM    [facility] at [facility] [address], [place], [place] Phone: [phone]; Fax: [phone]

## 2022-04-29 ENCOUNTER — Ambulatory Visit: Payer: 59 | Attending: Cardiology | Admitting: Cardiology

## 2022-04-29 ENCOUNTER — Encounter: Payer: Self-pay | Admitting: Cardiology

## 2022-04-29 VITALS — BP 120/82 | HR 59 | Ht 61.0 in | Wt 197.6 lb

## 2022-04-29 DIAGNOSIS — R0789 Other chest pain: Secondary | ICD-10-CM | POA: Diagnosis not present

## 2022-04-29 DIAGNOSIS — E782 Mixed hyperlipidemia: Secondary | ICD-10-CM | POA: Diagnosis not present

## 2022-04-29 DIAGNOSIS — R002 Palpitations: Secondary | ICD-10-CM | POA: Diagnosis not present

## 2022-04-29 NOTE — Patient Instructions (Signed)
Medication Instructions:Your physician recommends that you continue on your current medications as directed. Please refer to the Current Medication list given to you today.    Labwork: None today  Testing/Procedures: Your physician has requested that you have a lexiscan myoview. For further information please visit [email or web address]. Please follow instruction sheet, as given.   Follow-Up: 6 months  Any Other Special Instructions Will Be Listed Below (If Applicable).  If you need a refill on your cardiac medications before your next appointment, please call your pharmacy.  

## 2022-05-05 ENCOUNTER — Encounter (HOSPITAL_BASED_OUTPATIENT_CLINIC_OR_DEPARTMENT_OTHER)
Admission: RE | Admit: 2022-05-05 | Discharge: 2022-05-05 | Disposition: A | Discharge status: Home / Self Care | Payer: 59 | Source: Ambulatory Visit | Attending: Cardiology | Admitting: Cardiology

## 2022-05-05 ENCOUNTER — Ambulatory Visit (HOSPITAL_COMMUNITY)
Admission: RE | Admit: 2022-05-05 | Discharge: 2022-05-05 | Disposition: A | Discharge status: Home / Self Care | Payer: 59 | Source: Ambulatory Visit | Attending: Cardiology | Admitting: Cardiology

## 2022-05-05 ENCOUNTER — Encounter: Payer: Self-pay | Admitting: Physician Assistant

## 2022-05-05 DIAGNOSIS — R0789 Other chest pain: Secondary | ICD-10-CM | POA: Diagnosis not present

## 2022-05-05 LAB — NM MYOCAR MULTI W/SPECT W/WALL MOTION / EF
LV dias vol: 80 mL (ref 46–106)
LV sys vol: 28 mL
Nuc Stress EF: 62 %
Peak HR: 90 {beats}/min
RATE: 0.4
Rest HR: 59 {beats}/min
Rest Nuclear Isotope Dose: 10 mCi
SDS: 2
SRS: 7
SSS: 5
ST Depression (mm): 0 mm
Stress Nuclear Isotope Dose: 30 mCi
TID: 1.03

## 2022-05-05 MED ORDER — TECHNETIUM TC 99M TETROFOSMIN IV KIT
10.0000 | PACK | Freq: Once | INTRAVENOUS | Status: AC | PRN
  Administered 2022-05-05: 10 via INTRAVENOUS

## 2022-05-05 MED ORDER — SODIUM CHLORIDE FLUSH 0.9 % IV SOLN
INTRAVENOUS | Status: AC
  Administered 2022-05-05: 10 mL via INTRAVENOUS
  Filled 2022-05-05: qty 10

## 2022-05-05 MED ORDER — REGADENOSON 0.4 MG/5ML IV SOLN
INTRAVENOUS | Status: AC
  Administered 2022-05-05: 0.4 mg via INTRAVENOUS
  Filled 2022-05-05: qty 5

## 2022-05-05 MED ORDER — TECHNETIUM TC 99M TETROFOSMIN IV KIT
30.0000 | PACK | Freq: Once | INTRAVENOUS | Status: AC | PRN
  Administered 2022-05-05: 30 via INTRAVENOUS

## 2022-05-05 NOTE — Progress Notes (Signed)
Hx reviewed in prep for proctoring NST. Brief second degree AVB noted on 02/2022 monitor, asymptomatic. D/w Dr. Diona Browner -> as long as no current conduction abnormalities on EKG today, OK to proceed with Lexiscan. EKG today showed NSR without evidence of heart block. Patient tolerated test well without conduction abnormalities.

## 2022-05-11 DIAGNOSIS — Z713 Dietary counseling and surveillance: Secondary | ICD-10-CM | POA: Diagnosis not present

## 2022-05-11 DIAGNOSIS — R5382 Chronic fatigue, unspecified: Secondary | ICD-10-CM | POA: Diagnosis not present

## 2022-05-11 DIAGNOSIS — G44209 Tension-type headache, unspecified, not intractable: Secondary | ICD-10-CM | POA: Diagnosis not present

## 2022-05-11 DIAGNOSIS — E785 Hyperlipidemia, unspecified: Secondary | ICD-10-CM | POA: Diagnosis not present

## 2022-05-11 DIAGNOSIS — Z7182 Exercise counseling: Secondary | ICD-10-CM | POA: Diagnosis not present

## 2022-05-11 DIAGNOSIS — R5383 Other fatigue: Secondary | ICD-10-CM | POA: Diagnosis not present

## 2022-05-11 DIAGNOSIS — E559 Vitamin D deficiency, unspecified: Secondary | ICD-10-CM | POA: Diagnosis not present

## 2022-05-11 DIAGNOSIS — Z6832 Body mass index (BMI) 32.0-32.9, adult: Secondary | ICD-10-CM | POA: Diagnosis not present

## 2022-05-11 DIAGNOSIS — R42 Dizziness and giddiness: Secondary | ICD-10-CM | POA: Diagnosis not present

## 2022-05-12 DIAGNOSIS — R69 Illness, unspecified: Secondary | ICD-10-CM | POA: Diagnosis not present

## 2022-05-12 DIAGNOSIS — F419 Anxiety disorder, unspecified: Secondary | ICD-10-CM | POA: Diagnosis not present

## 2022-05-14 ENCOUNTER — Encounter: Payer: Self-pay | Admitting: Neurology

## 2022-06-01 DIAGNOSIS — R69 Illness, unspecified: Secondary | ICD-10-CM | POA: Diagnosis not present

## 2022-06-01 DIAGNOSIS — F419 Anxiety disorder, unspecified: Secondary | ICD-10-CM | POA: Diagnosis not present

## 2022-06-15 DIAGNOSIS — R69 Illness, unspecified: Secondary | ICD-10-CM | POA: Diagnosis not present

## 2022-06-15 DIAGNOSIS — F419 Anxiety disorder, unspecified: Secondary | ICD-10-CM | POA: Diagnosis not present

## 2022-07-04 DIAGNOSIS — R413 Other amnesia: Secondary | ICD-10-CM | POA: Diagnosis not present

## 2022-07-04 DIAGNOSIS — R519 Headache, unspecified: Secondary | ICD-10-CM | POA: Diagnosis not present

## 2022-07-07 DIAGNOSIS — E785 Hyperlipidemia, unspecified: Secondary | ICD-10-CM | POA: Diagnosis not present

## 2022-07-07 DIAGNOSIS — Z7985 Long-term (current) use of injectable non-insulin antidiabetic drugs: Secondary | ICD-10-CM | POA: Diagnosis not present

## 2022-07-07 DIAGNOSIS — Z7984 Long term (current) use of oral hypoglycemic drugs: Secondary | ICD-10-CM | POA: Diagnosis not present

## 2022-07-07 DIAGNOSIS — Z6837 Body mass index (BMI) 37.0-37.9, adult: Secondary | ICD-10-CM | POA: Diagnosis not present

## 2022-07-07 DIAGNOSIS — E119 Type 2 diabetes mellitus without complications: Secondary | ICD-10-CM | POA: Diagnosis not present

## 2022-07-07 DIAGNOSIS — F419 Anxiety disorder, unspecified: Secondary | ICD-10-CM | POA: Diagnosis not present

## 2022-07-07 DIAGNOSIS — I10 Essential (primary) hypertension: Secondary | ICD-10-CM | POA: Diagnosis not present

## 2022-07-07 DIAGNOSIS — R69 Illness, unspecified: Secondary | ICD-10-CM | POA: Diagnosis not present

## 2022-07-07 DIAGNOSIS — M5481 Occipital neuralgia: Secondary | ICD-10-CM | POA: Diagnosis not present

## 2022-07-07 DIAGNOSIS — Z8249 Family history of ischemic heart disease and other diseases of the circulatory system: Secondary | ICD-10-CM | POA: Diagnosis not present

## 2022-07-07 DIAGNOSIS — R42 Dizziness and giddiness: Secondary | ICD-10-CM | POA: Diagnosis not present

## 2022-07-30 DIAGNOSIS — R69 Illness, unspecified: Secondary | ICD-10-CM | POA: Diagnosis not present

## 2022-08-18 DIAGNOSIS — R69 Illness, unspecified: Secondary | ICD-10-CM | POA: Diagnosis not present

## 2022-08-18 DIAGNOSIS — F419 Anxiety disorder, unspecified: Secondary | ICD-10-CM | POA: Diagnosis not present

## 2022-08-28 DIAGNOSIS — Z79899 Other long term (current) drug therapy: Secondary | ICD-10-CM | POA: Diagnosis not present

## 2022-08-28 DIAGNOSIS — R5382 Chronic fatigue, unspecified: Secondary | ICD-10-CM | POA: Diagnosis not present

## 2022-08-28 DIAGNOSIS — E119 Type 2 diabetes mellitus without complications: Secondary | ICD-10-CM | POA: Diagnosis not present

## 2022-08-28 DIAGNOSIS — E785 Hyperlipidemia, unspecified: Secondary | ICD-10-CM | POA: Diagnosis not present

## 2022-08-28 DIAGNOSIS — R202 Paresthesia of skin: Secondary | ICD-10-CM | POA: Diagnosis not present

## 2022-08-28 DIAGNOSIS — I10 Essential (primary) hypertension: Secondary | ICD-10-CM | POA: Diagnosis not present

## 2022-09-03 DIAGNOSIS — R69 Illness, unspecified: Secondary | ICD-10-CM | POA: Diagnosis not present

## 2022-09-07 DIAGNOSIS — F419 Anxiety disorder, unspecified: Secondary | ICD-10-CM | POA: Diagnosis not present

## 2022-09-07 DIAGNOSIS — R69 Illness, unspecified: Secondary | ICD-10-CM | POA: Diagnosis not present

## 2022-09-09 DIAGNOSIS — E119 Type 2 diabetes mellitus without complications: Secondary | ICD-10-CM | POA: Diagnosis not present

## 2022-09-09 DIAGNOSIS — G43109 Migraine with aura, not intractable, without status migrainosus: Secondary | ICD-10-CM | POA: Diagnosis not present

## 2022-09-09 DIAGNOSIS — Z7984 Long term (current) use of oral hypoglycemic drugs: Secondary | ICD-10-CM | POA: Diagnosis not present

## 2022-09-09 DIAGNOSIS — H2513 Age-related nuclear cataract, bilateral: Secondary | ICD-10-CM | POA: Diagnosis not present

## 2022-09-21 ENCOUNTER — Ambulatory Visit: Payer: 59 | Admitting: Neurology

## 2022-09-29 DIAGNOSIS — F419 Anxiety disorder, unspecified: Secondary | ICD-10-CM | POA: Diagnosis not present

## 2022-09-29 DIAGNOSIS — R69 Illness, unspecified: Secondary | ICD-10-CM | POA: Diagnosis not present

## 2022-10-01 DIAGNOSIS — R69 Illness, unspecified: Secondary | ICD-10-CM | POA: Diagnosis not present

## 2022-10-27 DIAGNOSIS — F329 Major depressive disorder, single episode, unspecified: Secondary | ICD-10-CM | POA: Diagnosis not present

## 2022-10-27 DIAGNOSIS — F419 Anxiety disorder, unspecified: Secondary | ICD-10-CM | POA: Diagnosis not present

## 2022-11-04 DIAGNOSIS — G44209 Tension-type headache, unspecified, not intractable: Secondary | ICD-10-CM | POA: Diagnosis not present

## 2022-11-06 NOTE — Progress Notes (Deleted)
    Cardiology Office Note  Date: 11/06/2022   ID: Dhrithi Riche, DOB [birth year], MRN [id number]  History of Present Illness: Sarah Porter is a 62 y.o. female last seen in August 2023.  Physical Exam: VS:  There were no vitals taken for this visit., BMI There is no height or weight on file to calculate BMI.  Wt Readings from Last 3 Encounters:  04/29/22 197 lb 9.6 oz (89.6 kg)  02/26/22 191 lb (86.6 kg)  11/19/21 208 lb (94.3 kg)    General: Patient appears comfortable at rest. HEENT: Conjunctiva and lids normal, oropharynx clear with moist mucosa. Neck: Supple, no elevated JVP or carotid bruits, no thyromegaly. Lungs: Clear to auscultation, nonlabored breathing at rest. Cardiac: Regular rate and rhythm, no S3 or significant systolic murmur, no pericardial rub. Abdomen: Soft, nontender, no hepatomegaly, bowel sounds present, no guarding or rebound. Extremities: No pitting edema, distal pulses 2+. Skin: Warm and dry. Musculoskeletal: No kyphosis. Neuropsychiatric: Alert and oriented x3, affect grossly appropriate.  ECG:  An ECG dated 02/26/2022 was personally reviewed today and demonstrated:  Sinus rhythm with borderline low voltage.  Labwork:  December 2022: TSH 1.11, BUN 18, creatinine 0.77, potassium 4.4, AST 36, ALT 56, hemoglobin 13.7, platelets 334   Other Studies Reviewed Today:  Lexiscan Myoview 05/05/2022:   Findings are consistent with no prior ischemia. The study is low risk.   No ST deviation was noted.   Left ventricular function is normal. End diastolic cavity size is normal. End systolic cavity size is normal.   Prior study not available for comparison.   IMPRESSIONS Negative for stress induced arrhythmias.  Stress ECG nondiagnostic due to pharmacologic protocol. Normal left ventricular function and size. There is an anteroseptal perfusion defect in rest that that partial improves with stress and with normal wall motion.  Artifact is favored over infarct. There is  diaphragmatic activity at rest that is not present at stress.   CONCLUSIONS No ischemia. Low risk study.  Assessment and Plan:  1.  Palpitations, previous cardiac monitoring in July 2023 showed atrial and ventricular ectopy including brief episode of SVT.  She continues on Toprol-XL.  2.  History of chest discomfort, Lexiscan Myoview in September 2023 was reassuring showing no evidence of ischemia and normal LVEF.  3.  Mixed hyperlipidemia, currently on Lipitor.  Disposition:  Follow up {follow up:15908}  Signed, Satira Sark, M.D., F.A.C.C.

## 2022-11-09 ENCOUNTER — Ambulatory Visit: Payer: 59 | Admitting: Cardiology

## 2022-11-09 DIAGNOSIS — R002 Palpitations: Secondary | ICD-10-CM

## 2022-11-10 DIAGNOSIS — F332 Major depressive disorder, recurrent severe without psychotic features: Secondary | ICD-10-CM | POA: Diagnosis not present

## 2022-11-30 DIAGNOSIS — F329 Major depressive disorder, single episode, unspecified: Secondary | ICD-10-CM | POA: Diagnosis not present

## 2022-11-30 DIAGNOSIS — F419 Anxiety disorder, unspecified: Secondary | ICD-10-CM | POA: Diagnosis not present

## 2022-11-30 DIAGNOSIS — R5383 Other fatigue: Secondary | ICD-10-CM | POA: Diagnosis not present

## 2022-12-01 DIAGNOSIS — Z79899 Other long term (current) drug therapy: Secondary | ICD-10-CM | POA: Diagnosis not present

## 2022-12-01 DIAGNOSIS — I10 Essential (primary) hypertension: Secondary | ICD-10-CM | POA: Diagnosis not present

## 2022-12-01 DIAGNOSIS — Z6836 Body mass index (BMI) 36.0-36.9, adult: Secondary | ICD-10-CM | POA: Diagnosis not present

## 2022-12-01 DIAGNOSIS — G44209 Tension-type headache, unspecified, not intractable: Secondary | ICD-10-CM | POA: Diagnosis not present

## 2022-12-01 DIAGNOSIS — E559 Vitamin D deficiency, unspecified: Secondary | ICD-10-CM | POA: Diagnosis not present

## 2022-12-01 DIAGNOSIS — E785 Hyperlipidemia, unspecified: Secondary | ICD-10-CM | POA: Diagnosis not present

## 2022-12-01 DIAGNOSIS — R5382 Chronic fatigue, unspecified: Secondary | ICD-10-CM | POA: Diagnosis not present

## 2022-12-01 DIAGNOSIS — M179 Osteoarthritis of knee, unspecified: Secondary | ICD-10-CM | POA: Diagnosis not present

## 2022-12-01 DIAGNOSIS — R5383 Other fatigue: Secondary | ICD-10-CM | POA: Diagnosis not present

## 2022-12-01 DIAGNOSIS — E119 Type 2 diabetes mellitus without complications: Secondary | ICD-10-CM | POA: Diagnosis not present

## 2022-12-15 ENCOUNTER — Encounter: Payer: Self-pay | Admitting: Neurology

## 2022-12-15 ENCOUNTER — Ambulatory Visit: Payer: 59 | Admitting: Obstetrics & Gynecology

## 2022-12-15 DIAGNOSIS — F332 Major depressive disorder, recurrent severe without psychotic features: Secondary | ICD-10-CM | POA: Diagnosis not present

## 2022-12-16 ENCOUNTER — Ambulatory Visit (INDEPENDENT_AMBULATORY_CARE_PROVIDER_SITE_OTHER): Payer: 59 | Admitting: Obstetrics & Gynecology

## 2022-12-16 ENCOUNTER — Encounter: Payer: Self-pay | Admitting: Obstetrics & Gynecology

## 2022-12-16 VITALS — BP 108/69 | HR 59 | Ht 61.0 in | Wt 214.0 lb

## 2022-12-16 DIAGNOSIS — Z01419 Encounter for gynecological examination (general) (routine) without abnormal findings: Secondary | ICD-10-CM | POA: Diagnosis not present

## 2022-12-16 NOTE — Progress Notes (Signed)
   WELL-WOMAN EXAMINATION Patient name: Sarah Porter MRN [id number]  Date of birth: April 30, 1961 Chief Complaint:   Gynecologic Exam  History of Present Illness:   Sarah Porter is a 62 y.o. G2P2002 PM female being seen today for a routine well-woman exam.   Today she notes no acute GYN complaints or concerns.  Denies vaginal bleeding.  Denies vaginal discharge itching or irritation.  Denies pelvic or abdominal pain.  She on occasion will note stress incontinence but minimal and does not require a pad.  Reviewed conservative management   No LMP recorded. Patient is postmenopausal.  Last pap 10/2021.  Last mammogram: 03/2022. Last colonoscopy: 2022     12/16/2022    9:10 AM 11/05/2021    8:44 AM  Depression screen PHQ 2/9  Decreased Interest 1 0  Down, Depressed, Hopeless 1 0  PHQ - 2 Score 2 0  Altered sleeping 3 1  Tired, decreased energy 3 1  Change in appetite 1 0  Feeling bad or failure about yourself  2 0  Trouble concentrating 0 0  Moving slowly or fidgety/restless 0 0  Suicidal thoughts 0 0  PHQ-9 Score 11 2      Review of Systems:   Pertinent items are noted in HPI Denies any headaches, blurred vision, fatigue, shortness of breath, chest pain, abdominal pain, bowel movements, urination, or intercourse unless otherwise stated above.  Pertinent History Reviewed:  Reviewed past medical,surgical, social and family history.  Reviewed problem list, medications and allergies. Physical Assessment:   Vitals:   12/16/22 0916  BP: 108/69  Pulse: (!) 59  Weight: 214 lb (97.1 kg)  Height:  (1.549 m)  Body mass index is 40.43 kg/m.        Physical Examination:   General appearance - well appearing, and in no distress  Mental status - alert, oriented to person, place, and time  Psych:  She has a normal mood and affect  Skin - warm and dry, normal color, no suspicious lesions noted  Chest - effort normal, all lung fields clear to auscultation bilaterally  Heart -  normal rate and regular rhythm  Neck:  midline trachea, no thyromegaly or nodules  Breasts - breasts appear normal, no suspicious masses, no skin or nipple changes or  axillary nodes  Abdomen - soft, nontender, nondistended, no masses or organomegaly  Pelvic - VULVA: normal appearing vulva with no masses, tenderness or lesions, narrowed introitus VAGINA: normal appearing vagina with normal color and discharge, no lesions  CERVIX: normal appearing cervix without discharge or lesions, no CMT  UTERUS: uterus is felt to be normal size, shape, consistency and nontender   ADNEXA: No adnexal masses or tenderness noted.  Extremities:  No swelling or varicosities noted  Chaperone:  Patient declined      Assessment & Plan:  1) Well-Woman Exam -Screening exams up-to-date - Reviewed ASCCP guidelines, next Pap due 2026   Meds: No orders of the defined types were placed in this encounter.   Follow-up: Return in about 1 year (around 12/16/2023) for 1-73yr annual.   Myna Hidalgo, DO Attending Obstetrician & Gynecologist, [facility], [facility]

## 2022-12-17 ENCOUNTER — Other Ambulatory Visit: Payer: Self-pay

## 2022-12-17 ENCOUNTER — Encounter: Payer: Self-pay | Admitting: Orthopaedic Surgery

## 2022-12-17 ENCOUNTER — Ambulatory Visit: Payer: 59 | Admitting: Orthopaedic Surgery

## 2022-12-17 VITALS — Ht 61.0 in | Wt 212.0 lb

## 2022-12-17 DIAGNOSIS — M7712 Lateral epicondylitis, left elbow: Secondary | ICD-10-CM

## 2022-12-17 DIAGNOSIS — M25522 Pain in left elbow: Secondary | ICD-10-CM

## 2022-12-17 NOTE — Progress Notes (Signed)
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 Chronic dizziness, may be cervicogenic.  She also has low blood pressure, which may be playing a role.     Refer to physical therapy for neck exercises and vestibular rehab Discontinue ibuprofen and acetaminophen.  For acute flares of headache, may use baclofen 10mg  but limit to no more than 2 days out of week. Taper off topiramate as it has not been effective and to also limit polypharmacy She will continue to see how she does on Aimovig, if there is a  migraine component to her headaches. Follow up with PCP regarding change in blood pressure management. Follow up 5 months.   Subjective:  Sarah Porter is a 62 year old female with ADD, HTN, DM II with neuropathy, HLD, GERD, chronic fatigue, CKD stage 2, anxiety and depression who presents for headaches.  History supplemented by prior neurologist's and referring provider's notes.  MRI of brain personally reviewed.  About 2 years ago, after sexual intercourse, she stood up and developed severe vertigo with nausea and vomiting.  Lasted about an hour but will feel off balance for a few days..  Has since developed a headache.  An aching or pressure that start at base of neck/shoulders and radiates up the back of head bilaterally to the front and temples.  Associated with dizziness.  Sometimes nausea or blurred vision.  No photophobia or phonophobia.  They last all day.  Both headaches and dizzy spells were initially episodic.  The have progressively gotten more frequent and has been daily since November 2023.  Cannot go to the gym.   No known triggers.  Heat and massage therapy helps.  Takes Tylenol or ibuprofen daily.  She also has chronic fatigue and  chronic pain.  She has burning and tingling in arms and legs.  Has RLS.  Pain in joints and muscles.    07/04/2022 MRI BRAIN WO:  No evidence of acute intracranial abnormality.  Mild multifocal T2 FLAIR hyperintense signal abnormality within the  cerebral white matter. These signal changes are nonspecific, but  most often secondary to chronic small vessel ischemia. Alternative  considerations include sequelae of chronic migraine headaches or  sequelae of a prior infectious/inflammatory process, among others. 14 mm mucous retention cyst within the right maxillary sinus.   Past NSAIDS/analgesics:  Tylenol, ibuprofen Past abortive triptans:  none Past abortive ergotamine:  none Past muscle relaxants:  Flexeril Past anti-emetic:  Zofran Past antihypertensive medications:  none Past antidepressant medications:  fluoxetine Past anticonvulsant medications:  gabapentin Past anti-CGRP:  Nurtec ODT 75mg  PRN Past vitamins/Herbal/Supplements:  none Past antihistamines/decongestants:  none Other past therapies:  occipital nerve block, trigger point injections.  Current NSAIDS/analgesics:  ASA 81mg  daily Current triptans:  none Current ergotamine:  none Current anti-emetic:  none Current muscle relaxants:  none Current Antihypertensive medications:  metoprolol succinate 25mg  daily, lisinopril  Current Antidepressant medications:  duloxetine 60mg  daily (anxiety - higher doses ineffective) Current Anticonvulsant medications:  topiramate 75 daily, pregablin 50mg  BID (RLS) Current anti-CGRP:  Ubrelvy 100mg  (ineffective), Aimovig 140mg  (started last month) Current Vitamins/Herbal/Supplements:  magnesium 400mg  daily, melatonin, MVI, C Current Antihistamines/Decongestants:  none Other therapy:  massage therapy    Caffeine:  1 cup coffee 1 to 2 times a day (she used to drink more).  No  soda Diet:  Water.  Sometimes skips meals.  Lac of appetite Exercise:  water aerobics.  Due to her symptoms, cannot go  to the gym  Depression:  yes; Anxiety:  yes Other pain:  neck pain (DDD); questionable fibromyalgia.  Will be seeing rheumatology. Sleep hygiene:  Insomnia.  Being evaluated for OSA. Family history of headache:  sister (headaches related to benign brain tumor, migraines)      PAST MEDICAL HISTORY: Past Medical History:  Diagnosis Date   ADD (attention deficit disorder)    Anxiety    Depression    Essential hypertension    GERD (gastroesophageal reflux disease)    Hyperlipidemia    Type 2 diabetes mellitus    Vertigo     PAST SURGICAL HISTORY: Past Surgical History:  Procedure Laterality Date   BILATERAL KNEE REPAIRS     CESAREAN SECTION     x 2   COLONOSCOPY  11/04/2011   Dr. Edrick Kins; diminutive colon polyp in the rectum, otherwise normal exam.  Pathology with tubular adenoma.  Recommended 5-year repeat.   COLONOSCOPY  07/12/2014   Dr. Edrick Kins; submucosal hemorrhages in proximal descending colon?  Prior ischemic or infectious colitis?,  Otherwise normal exam.  Recommended 5-year repeat.   COLONOSCOPY WITH PROPOFOL N/A 07/22/2021   Procedure: COLONOSCOPY WITH PROPOFOL;  Surgeon: Lanelle Bal, DO;  [facility];  Service: Endoscopy;  Laterality: N/A;  8:45am   ESOPHAGOGASTRODUODENOSCOPY  07/21/2004   Dr. Edrick Kins; grade 2 erosive esophagitis at EG junction, cervical inlet patch, otherwise normal exam.    MEDICATIONS: Current Outpatient Medications on File Prior to Visit  Medication Sig Dispense Refill   AIMOVIG 140 MG/ML SOAJ as directed Subcutaneous monthly     atorvastatin (LIPITOR) 80 MG tablet Take 80 mg by mouth in the morning.     Dulaglutide (TRULICITY) 0.75 MG/0.5ML SOPN Inject into the skin once a week.     DULoxetine (CYMBALTA) 30 MG capsule Take 30 mg by mouth daily.     lisinopril (ZESTRIL) 5 MG tablet Take 5 mg by mouth in the morning.     metFORMIN (GLUCOPHAGE) 500 MG tablet Take 500 mg by mouth daily with breakfast.      metoprolol succinate (TOPROL-XL) 25 MG 24 hr tablet Take 25 mg by mouth in the morning.     Multiple Vitamin (MULTIVITAMIN WITH MINERALS) TABS tablet Take 1 tablet by mouth in the morning.     omeprazole (PRILOSEC) 20 MG capsule Take 20 mg by mouth daily.     pregabalin (LYRICA) 25 MG capsule Take by mouth. 50 mg at night 25 mg in the morning     topiramate (TOPAMAX) 25 MG tablet Take 75 mg by mouth daily.     UBRELVY 100 MG TABS Take by mouth.     vitamin C (ASCORBIC ACID) 500 MG tablet Take 500 mg by mouth in the morning.     No current facility-administered medications on file prior to visit.    ALLERGIES: No Known Allergies  FAMILY HISTORY: Family History  Problem Relation Age of Onset   AAA (abdominal aortic aneurysm) Mother    Asthma Sister    Kidney cancer Sister        had kidney removed   Diabetes Sister    Lymphoma Nephew    Colon cancer Neg Hx   Dementia     Father Brain tumor/retinoblastoma   Sister  Objective:  Blood pressure (!) 64/43, pulse 61, height  (1.549 m),  weight 220 lb 9.6 oz (100.1 kg), SpO2 97 %. General: No acute distress.  Patient appears well-groomed.   Head:  Normocephalic/atraumatic Eyes:  fundi examined but not visualized Neck: supple, mild bilateral paraspinal tenderness, full range of motion Heart: regular rate and rhythm Neurological Exam: Mental status: alert and oriented to person, place, and time, speech fluent and not dysarthric, language intact. Cranial nerves: CN I: not tested CN II: pupils equal, round and reactive to light, visual fields intact CN III, IV, VI:  full range of motion, no nystagmus, no ptosis CN V: facial sensation intact. CN VII: upper and lower face symmetric CN VIII: hearing intact CN IX, X: gag intact, uvula midline CN XI: sternocleidomastoid and trapezius muscles intact CN XII: tongue midline Bulk & Tone: normal, no fasciculations. Motor:  muscle strength 5/5 throughout Sensation:  Pinprick, temperature  and vibratory sensation intact. Deep Tendon Reflexes:  2+ throughout,  toes downgoing.   Finger to nose testing:  Without dysmetria.   Heel to shin:  Without dysmetria.   Gait:  Normal station and stride.  Romberg negative.    Thank you for allowing me to take part in the care of this patient.  Shon Millet, DO  CC:  Coral Ceo, FNP  Waldon Reining, MD

## 2022-12-17 NOTE — Progress Notes (Signed)
Office Visit Note   Patient: Sarah Porter           Date of Birth: Sep 22, 1960           MRN: [id number] Visit Date: 12/17/2022              Requested by: Waldon Reining, MD [address] [place],  [place] [address] PCP: Waldon Reining, MD   Assessment & Plan: Visit Diagnoses:  1. Pain in left elbow   2. Left tennis elbow     Plan: Tennis elbow brace applied for lateral epicondylitis.  Pathophysiology discussed.  She needs to just rested till her symptoms resolve and then she can gradually resume activities with much lighter weights.  Follow-Up Instructions: No follow-ups on file.   Orders:  Orders Placed This Encounter  Procedures   XR Elbow 2 Views Left   No orders of the defined types were placed in this encounter.     Procedures: No procedures performed   Clinical Data: No additional findings.   Subjective: Chief Complaint  Patient presents with   Left Elbow - Pain    HPI 62 year old female previous patient Dr. Cleophas Dunker seen with left elbow pain.  Patient states couple months ago started doing strength training with weights at a personal trainer and then progressed to class and started having left elbow pain.  She is having to use her right arm to pick up objects.  When she tries use the clutch of motorcycle she has sharp pain.  She has used ibuprofen and Tylenol.  She did have a fall on her left side a couple weeks ago but does not think it really related to elbow pain.  She stopped doing strength training she is doing water aerobics.  She is seeing a neurologist diagnosed with fibromyalgia being referred to rheumatology.  Review of Systems all systems noncontributory to HPI.   Objective: Vital Signs: Ht  (1.549 m)   Wt 212 lb (96.2 kg)   BMI 40.06 kg/m   Physical Exam Constitutional:      Appearance: She is well-developed.  HENT:     Head: Normocephalic.     Right Ear: External ear normal.     Left Ear: External ear normal. There is no  impacted cerumen.  Eyes:     Pupils: Pupils are equal, round, and reactive to light.  Neck:     Thyroid: No thyromegaly.     Trachea: No tracheal deviation.  Cardiovascular:     Rate and Rhythm: Normal rate.  Pulmonary:     Effort: Pulmonary effort is normal.  Abdominal:     Palpations: Abdomen is soft.  Musculoskeletal:     Cervical back: No rigidity.  Skin:    General: Skin is warm and dry.  Neurological:     Mental Status: She is alert and oriented to person, place, and time.  Psychiatric:        Behavior: Behavior normal.     Ortho Exam patient has exquisite tenderness over the left lateral epicondyle pain with resisted wrist extension consistent with ladder epicondylitis.  Good cervical range of motion.  Impingement the shoulder.  Specialty Comments:  No specialty comments available.  Imaging: No results found.   PMFS History: Patient Active Problem List   Diagnosis Date Noted   Left tennis elbow 12/17/2022   H/O adenomatous polyp of colon 06/25/2021   Tibialis posterior tendinitis, right 05/22/2020   Herniated nucleus pulposus, L4-5 right 05/22/2020   Arthritis of right  ankle 05/22/2020   Pain in right ankle and joints of right foot 05/08/2020   Low back pain 05/08/2020   Morbid (severe) obesity due to excess calories 05/08/2020   Past Medical History:  Diagnosis Date   ADD (attention deficit disorder)    Anxiety    Depression    Essential hypertension    GERD (gastroesophageal reflux disease)    Hyperlipidemia    Type 2 diabetes mellitus    Vertigo     Family History  Problem Relation Age of Onset   AAA (abdominal aortic aneurysm) Mother    Asthma Sister    Kidney cancer Sister        had kidney removed   Diabetes Sister    Lymphoma Nephew    Colon cancer Neg Hx     Past Surgical History:  Procedure Laterality Date   BILATERAL KNEE REPAIRS     CESAREAN SECTION     x 2   COLONOSCOPY  11/04/2011   Dr. Edrick Kins; diminutive colon polyp in  the rectum, otherwise normal exam.  Pathology with tubular adenoma.  Recommended 5-year repeat.   COLONOSCOPY  07/12/2014   Dr. Edrick Kins; submucosal hemorrhages in proximal descending colon?  Prior ischemic or infectious colitis?,  Otherwise normal exam.  Recommended 5-year repeat.   COLONOSCOPY WITH PROPOFOL N/A 07/22/2021   Procedure: COLONOSCOPY WITH PROPOFOL;  Surgeon: Lanelle Bal, DO;  [facility];  Service: Endoscopy;  Laterality: N/A;  8:45am   ESOPHAGOGASTRODUODENOSCOPY  07/21/2004   Dr. Edrick Kins; grade 2 erosive esophagitis at EG junction, cervical inlet patch, otherwise normal exam.   Social History   Occupational History   Not on file  Tobacco Use   Smoking status: Former    Types: Cigarettes    Quit date: 09/01/1999    Years since quitting: 23.3   Smokeless tobacco: Never   Tobacco comments:    smoked for approx 20 years   Vaping Use   Vaping Use: Never used  Substance and Sexual Activity   Alcohol use: Not Currently   Drug use: Yes    Types: Marijuana    Comment: occasionally   Sexual activity: Yes    Birth control/protection: Post-menopausal

## 2022-12-21 ENCOUNTER — Encounter: Payer: Self-pay | Admitting: Neurology

## 2022-12-21 ENCOUNTER — Ambulatory Visit: Payer: 59 | Admitting: Neurology

## 2022-12-21 VITALS — BP 64/43 | HR 61 | Ht 61.0 in | Wt 220.6 lb

## 2022-12-21 DIAGNOSIS — G4486 Cervicogenic headache: Secondary | ICD-10-CM | POA: Diagnosis not present

## 2022-12-21 DIAGNOSIS — R42 Dizziness and giddiness: Secondary | ICD-10-CM | POA: Diagnosis not present

## 2022-12-21 MED ORDER — BACLOFEN 10 MG PO TABS: 10.0000 mg | ORAL_TABLET | Freq: Three times a day (TID) | ORAL | 5 refills | Status: DC | PRN

## 2022-12-21 NOTE — Patient Instructions (Signed)
Headache and dizziness may be coming from the neck.  Also ongoing daily headaches may be related to taking too much pain relievers   Refer to physical therapy for neck exercises and vestibular rehab (neck pain, cervicogenic headache, cervicogenic dizziness) Stop ibuprofen and acetaminophen. When you get a flare up of headache or neck pain, may take baclofen  up to three times daily as needed, but try to limit to no more than 2 days out of the week.  Caution for drowsiness Taper off topiramate - take  daily for one week, then  daily for one week, then STOP Keep headache diary Follow up with rheumatology and sleep study Follow up in 6 months.

## 2022-12-29 ENCOUNTER — Ambulatory Visit: Payer: 59 | Attending: Cardiology | Admitting: Nurse Practitioner

## 2022-12-29 ENCOUNTER — Encounter: Payer: Self-pay | Admitting: Nurse Practitioner

## 2022-12-29 VITALS — BP 116/74 | HR 79 | Ht 61.0 in | Wt 217.4 lb

## 2022-12-29 DIAGNOSIS — R002 Palpitations: Secondary | ICD-10-CM | POA: Diagnosis not present

## 2022-12-29 DIAGNOSIS — E119 Type 2 diabetes mellitus without complications: Secondary | ICD-10-CM | POA: Diagnosis not present

## 2022-12-29 DIAGNOSIS — I10 Essential (primary) hypertension: Secondary | ICD-10-CM | POA: Diagnosis not present

## 2022-12-29 DIAGNOSIS — R0602 Shortness of breath: Secondary | ICD-10-CM

## 2022-12-29 DIAGNOSIS — E785 Hyperlipidemia, unspecified: Secondary | ICD-10-CM

## 2022-12-29 NOTE — Progress Notes (Signed)
Office Visit    Patient Name: Cecilee Mechling Date of Encounter: 12/29/2022 PCP:  Waldon Reining, MD   [facility]  Cardiologist:  [name], MD  Advanced Practice Provider:  No care team member to display Electrophysiologist:  None   Chief Complaint    Sarah Porter is a 62 y.o. female with a hx of atypical chest pain, palpitations, type 2 diabetes, hyperlipidemia, hypertension, vertigo, GERD, ADD, and anxiety/depression, who presents today for 76-month follow-up.  Past Medical History    Past Medical History:  Diagnosis Date   Anxiety    Depression    Essential hypertension    GERD (gastroesophageal reflux disease)    Hyperlipidemia    Type 2 diabetes mellitus (HCC)    Vertigo    Past Surgical History:  Procedure Laterality Date   BILATERAL KNEE REPAIRS     CESAREAN SECTION     x 2   COLONOSCOPY  11/04/2011   Dr. Edrick Kins; diminutive colon polyp in the rectum, otherwise normal exam.  Pathology with tubular adenoma.  Recommended 5-year repeat.   COLONOSCOPY  07/12/2014   Dr. Edrick Kins; submucosal hemorrhages in proximal descending colon?  Prior ischemic or infectious colitis?,  Otherwise normal exam.  Recommended 5-year repeat.   COLONOSCOPY WITH PROPOFOL N/A 07/22/2021   Procedure: COLONOSCOPY WITH PROPOFOL;  Surgeon: Lanelle Bal, DO;  [facility];  Service: Endoscopy;  Laterality: N/A;  8:45am   ESOPHAGOGASTRODUODENOSCOPY  07/21/2004   Dr. Edrick Kins; grade 2 erosive esophagitis at EG junction, cervical inlet patch, otherwise normal exam.    Allergies  No Known Allergies  History of Present Illness    Sarah Porter is a 62 y.o. female with a PMH as mentioned above.   Last seen by Dr. Diona Browner on April 29, 2022.  She had noted intermittent chest discomfort at that time, noted as a pressure/indigestion sensation in the central sternal area, noted neck discomfort.  Her symptoms are nonexertional.  She was very  active at the time.  She lost over 30 pounds over the past several months, and changed her diet.  She underwent Myoview for evaluation, was deemed a low risk study.  Today she presents for follow-up.  Chief concern is shortness of breath when she bends over to tie her shoes, also experiences "heart pounding" and nausea, has been ongoing for the past several months.  Attributes this to her weight gain.  Denies any chest pain.  Recently diagnosed with fibromyalgia, sees Dr. Everlena Cooper with Neurology. Denies any chest pain, syncope, presyncope, dizziness, orthopnea, PND, swelling or significant weight changes, acute bleeding, or claudication.    EKGs/Labs/Other Studies Reviewed:   The following studies were reviewed today:   EKG:  EKG is not ordered today.    Myoview 05/2022:   Findings are consistent with no prior ischemia. The study is low risk.   No ST deviation was noted.   Left ventricular function is normal. End diastolic cavity size is normal. End systolic cavity size is normal.   Prior study not available for comparison.   IMPRESSIONS Negative for stress induced arrhythmias.  Stress ECG nondiagnostic due to pharmacologic protocol. Normal left ventricular function and size. There is an anteroseptal perfusion defect in rest that that partial improves with stress and with normal wall motion.  Artifact is favored over infarct. There is diaphragmatic activity at rest that is not present at stress.   CONCLUSIONS No ischemia. Low risk study.   Cardiac monitor 02/2022: [employer]  XT reviewed. 6 days, 16 hours analyzed:   Predominant rhythm is sinus with prolonged PR interval. Heart rate ranged from 47 bpm up to 106 bpm with average heart rate 65 bpm. There were rare PACs including couplets and triplets representing less than 1% total beats. There were rare PVCs representing less than 1% total beats. Single, very brief episode of SVT noted lasting 6 beats. Very brief and rare dropped beats were  noted (second degree type 1 or 2). No patient triggered events or diary entry associated with obvious arrhythmia.    Recent Labs: No results found for requested labs within last 365 days.  Recent Lipid Panel No results found for: "CHOL", "TRIG", "HDL", "CHOLHDL", "VLDL", "LDLCALC", "LDLDIRECT"   Home Medications   Current Meds  Medication Sig   AIMOVIG 140 MG/ML SOAJ as directed Subcutaneous monthly   atorvastatin (LIPITOR) 80 MG tablet Take 80 mg by mouth in the morning.   baclofen (LIORESAL) 10 MG tablet Take 1 tablet (10 mg total) by mouth 3 (three) times daily as needed for muscle spasms.   cholecalciferol (VITAMIN D3) 25 MCG (1000 UNIT) tablet Take 1,000 Units by mouth daily.   DULoxetine (CYMBALTA) 60 MG capsule Take 60 mg by mouth daily.   lisinopril (ZESTRIL) 5 MG tablet Take 5 mg by mouth in the morning.   magnesium 30 MG tablet Take 30 mg by mouth 2 (two) times daily.   metFORMIN (GLUCOPHAGE) 500 MG tablet Take 500 mg by mouth daily with breakfast.   metoprolol succinate (TOPROL-XL) 25 MG 24 hr tablet Take 25 mg by mouth in the morning.   Multiple Vitamin (MULTIVITAMIN WITH MINERALS) TABS tablet Take 1 tablet by mouth in the morning.   omeprazole (PRILOSEC) 20 MG capsule Take 20 mg by mouth daily.   pregabalin (LYRICA) 50 MG capsule Take 50 mg by mouth 2 (two) times daily. 50 mg at night 50mg  in the morning   UBRELVY 100 MG TABS Take by mouth.   vitamin C (ASCORBIC ACID) 500 MG tablet Take 500 mg by mouth in the morning.     Review of Systems    All other systems reviewed and are otherwise negative except as noted above.  Physical Exam    VS:  BP 116/74 (BP Location: Left Arm, Patient Position: Sitting, Cuff Size: Large)   Pulse 79   Ht 5\' 1"  (1.549 m)   Wt 217 lb 6.4 oz (98.6 kg)   SpO2 95%   BMI 41.08 kg/m  , BMI Body mass index is 41.08 kg/m.  Wt Readings from Last 3 Encounters:  12/29/22 217 lb 6.4 oz (98.6 kg)  12/21/22 220 lb 9.6 oz (100.1 kg)  12/17/22  212 lb (96.2 kg)     GEN: Morbidly obese, 62 y.o. female in no acute distress. HEENT: normal. Neck: Supple, no JVD, carotid bruits, or masses. Cardiac: S1/S2, RRR, no murmurs, rubs, or gallops. No clubbing, cyanosis, edema.  Radials/PT 2+ and equal bilaterally.  Respiratory:  Respirations regular and unlabored, clear to auscultation bilaterally. MS: No deformity or atrophy. Skin: Warm and dry, no rash. Neuro:  Strength and sensation are intact. Psych: Normal affect.  Assessment & Plan    Palpitations Monitor 02/2022 revealed predominantly normal sinus rhythm, rare PACs/PVCs, 1 episode of SVT, brief.  Notes palpitations (denies tachycardia)  when bending over, etiology seems most likely related to #5.  Continue to monitor at this time.  Continue metoprolol. Heart healthy diet and regular cardiovascular exercise encouraged.   Type 2 Diabetes  I do not see a recent A1c on file.  This is being managed by PCP.  Continue follow-up with Dr. Dahlia Client. Continue metformin. Heart healthy diet and regular cardiovascular exercise encouraged.  Referring to healthy weight and wellness clinic-see below.  HLD Do not see a recent lipid panel on file.  Continue atorvastatin.  Plan to request labs/obtain labs at future office visit. Heart healthy diet and regular cardiovascular exercise encouraged.   HTN Blood pressure stable.  Continue lisinopril and metoprolol succinate. Discussed to monitor BP at home at least 2 hours after medications and sitting for 5-10 minutes. Heart healthy diet and regular cardiovascular exercise encouraged.   Morbid obesity, shortness of breath BMI today 41.08.  Experiences shortness of breath when bending over to tie her shoes, attributes this to her weight, which I am in agreement with.  She is very determined to lose weight, but expresses frustration at difficulty with this in the past.  Will refer to healthy weight and wellness clinic.  If shortness of breath does not improve by  next office visit, will discuss with her regarding whether echocardiogram should be obtained. Myoview 05/2022 low risk. Continue current medication regimen.  Heart healthy diet and regular cardiovascular exercise encouraged.   Disposition: Follow up in 1 month(s) with [name], MD or [facility].  Signed, Sharlene Dory, NP 01/02/2023, 8:46 PM  [facility]

## 2022-12-29 NOTE — Patient Instructions (Signed)
Medication Instructions:  Your physician recommends that you continue on your current medications as directed. Please refer to the Current Medication list given to you today.   Labwork: none  Testing/Procedures: none  Follow-Up:  Your physician recommends that you schedule a follow-up appointment in: 1 month  Any Other Special Instructions Will Be Listed Below (If Applicable).  If you need a refill on your cardiac medications before your next appointment, please call your pharmacy.  

## 2022-12-30 DIAGNOSIS — F5101 Primary insomnia: Secondary | ICD-10-CM | POA: Diagnosis not present

## 2022-12-30 DIAGNOSIS — G2581 Restless legs syndrome: Secondary | ICD-10-CM | POA: Diagnosis not present

## 2022-12-30 DIAGNOSIS — G4719 Other hypersomnia: Secondary | ICD-10-CM | POA: Diagnosis not present

## 2022-12-30 DIAGNOSIS — I10 Essential (primary) hypertension: Secondary | ICD-10-CM | POA: Diagnosis not present

## 2023-01-01 DIAGNOSIS — Z7984 Long term (current) use of oral hypoglycemic drugs: Secondary | ICD-10-CM | POA: Diagnosis not present

## 2023-01-01 DIAGNOSIS — G43909 Migraine, unspecified, not intractable, without status migrainosus: Secondary | ICD-10-CM | POA: Diagnosis not present

## 2023-01-01 DIAGNOSIS — F419 Anxiety disorder, unspecified: Secondary | ICD-10-CM | POA: Diagnosis not present

## 2023-01-01 DIAGNOSIS — E1136 Type 2 diabetes mellitus with diabetic cataract: Secondary | ICD-10-CM | POA: Diagnosis not present

## 2023-01-01 DIAGNOSIS — E1122 Type 2 diabetes mellitus with diabetic chronic kidney disease: Secondary | ICD-10-CM | POA: Diagnosis not present

## 2023-01-01 DIAGNOSIS — E785 Hyperlipidemia, unspecified: Secondary | ICD-10-CM | POA: Diagnosis not present

## 2023-01-01 DIAGNOSIS — Z7982 Long term (current) use of aspirin: Secondary | ICD-10-CM | POA: Diagnosis not present

## 2023-01-01 DIAGNOSIS — Z833 Family history of diabetes mellitus: Secondary | ICD-10-CM | POA: Diagnosis not present

## 2023-01-01 DIAGNOSIS — G2581 Restless legs syndrome: Secondary | ICD-10-CM | POA: Diagnosis not present

## 2023-01-01 DIAGNOSIS — Z87891 Personal history of nicotine dependence: Secondary | ICD-10-CM | POA: Diagnosis not present

## 2023-01-01 DIAGNOSIS — I499 Cardiac arrhythmia, unspecified: Secondary | ICD-10-CM | POA: Diagnosis not present

## 2023-01-01 DIAGNOSIS — I129 Hypertensive chronic kidney disease with stage 1 through stage 4 chronic kidney disease, or unspecified chronic kidney disease: Secondary | ICD-10-CM | POA: Diagnosis not present

## 2023-01-05 ENCOUNTER — Telehealth: Payer: Self-pay | Admitting: Anesthesiology

## 2023-01-05 DIAGNOSIS — R5383 Other fatigue: Secondary | ICD-10-CM | POA: Diagnosis not present

## 2023-01-05 DIAGNOSIS — F419 Anxiety disorder, unspecified: Secondary | ICD-10-CM | POA: Diagnosis not present

## 2023-01-05 DIAGNOSIS — F329 Major depressive disorder, single episode, unspecified: Secondary | ICD-10-CM | POA: Diagnosis not present

## 2023-01-05 NOTE — Telephone Encounter (Signed)
Patient advised a new medication can take a while for your body to get use to it. But I will send this message to [facility].

## 2023-01-05 NOTE — Telephone Encounter (Signed)
Pt called stating, she has been taking Aimovig and Ubrelvi with no relief, states is there any other medications that she can try. States she is taking a muscle relaxer for her headaches and neck pain but it doesn't take the pain away.

## 2023-01-06 ENCOUNTER — Other Ambulatory Visit: Payer: Self-pay | Admitting: Neurology

## 2023-01-06 ENCOUNTER — Ambulatory Visit (HOSPITAL_COMMUNITY): Payer: 59 | Attending: Cardiology | Admitting: Physical Therapy

## 2023-01-06 ENCOUNTER — Other Ambulatory Visit: Payer: Self-pay

## 2023-01-06 DIAGNOSIS — R42 Dizziness and giddiness: Secondary | ICD-10-CM | POA: Diagnosis not present

## 2023-01-06 DIAGNOSIS — R002 Palpitations: Secondary | ICD-10-CM | POA: Insufficient documentation

## 2023-01-06 DIAGNOSIS — E785 Hyperlipidemia, unspecified: Secondary | ICD-10-CM | POA: Diagnosis not present

## 2023-01-06 DIAGNOSIS — G4486 Cervicogenic headache: Secondary | ICD-10-CM | POA: Insufficient documentation

## 2023-01-06 DIAGNOSIS — E119 Type 2 diabetes mellitus without complications: Secondary | ICD-10-CM | POA: Insufficient documentation

## 2023-01-06 DIAGNOSIS — I10 Essential (primary) hypertension: Secondary | ICD-10-CM | POA: Insufficient documentation

## 2023-01-06 DIAGNOSIS — R0602 Shortness of breath: Secondary | ICD-10-CM | POA: Diagnosis not present

## 2023-01-06 MED ORDER — TIZANIDINE HCL 2 MG PO TABS: 2.0000 mg | ORAL_TABLET | Freq: Three times a day (TID) | ORAL | 5 refills | Status: DC | PRN

## 2023-01-06 NOTE — Therapy (Addendum)
OUTPATIENT PHYSICAL THERAPY VESTIBULAR EVALUATION     Patient Name: Sarah Porter MRN: [id number] DOB:[birth year], 62 y.o., female Today's Date: 01/06/2023  END OF SESSION:  PT End of Session - 01/06/23 1254     Visit Number 2   Number of Visits 6    Date for PT Re-Evaluation 02/17/23    Authorization Type [employer] [facility]    Progress Note Due on Visit 6    PT Start Time [phone]   PT Stop Time 1028   PT Time Calculation (min) 40 min    Activity Tolerance Patient tolerated treatment well    Behavior During Therapy [facility] for tasks assessed/performed             Past Medical History:  Diagnosis Date   Anxiety    Depression    Essential hypertension    GERD (gastroesophageal reflux disease)    Hyperlipidemia    Type 2 diabetes mellitus (HCC)    Vertigo    Past Surgical History:  Procedure Laterality Date   BILATERAL KNEE REPAIRS     CESAREAN SECTION     x 2   COLONOSCOPY  11/04/2011   Dr. Edrick Kins; diminutive colon polyp in the rectum, otherwise normal exam.  Pathology with tubular adenoma.  Recommended 5-year repeat.   COLONOSCOPY  07/12/2014   Dr. Edrick Kins; submucosal hemorrhages in proximal descending colon?  Prior ischemic or infectious colitis?,  Otherwise normal exam.  Recommended 5-year repeat.   COLONOSCOPY WITH PROPOFOL N/A 07/22/2021   Procedure: COLONOSCOPY WITH PROPOFOL;  Surgeon: Lanelle Bal, DO;  [facility];  Service: Endoscopy;  Laterality: N/A;  8:45am   ESOPHAGOGASTRODUODENOSCOPY  07/21/2004   Dr. Edrick Kins; grade 2 erosive esophagitis at EG junction, cervical inlet patch, otherwise normal exam.   Patient Active Problem List   Diagnosis Date Noted   Left tennis elbow 12/17/2022   H/O adenomatous polyp of colon 06/25/2021   Tibialis posterior tendinitis, right 05/22/2020   Herniated nucleus pulposus, L4-5 right 05/22/2020   Arthritis of right ankle 05/22/2020   Pain in right ankle and joints of right foot 05/08/2020   Low  back pain 05/08/2020   Morbid (severe) obesity due to excess calories (HCC) 05/08/2020    PCP: Waldon Reining  REFERRING PROVIDER: Drema Dallas, DO  REFERRING DIAG:  Diagnosis  G44.86 (ICD-10-CM) - Cervicogenic headache  R42 (ICD-10-CM) - Dizziness    THERAPY DIAG:  Dizziness and giddiness  ONSET DATE: chronic   Rationale for Evaluation and Treatment: Rehabilitation    SUBJECTIVE STATEMENT: Pt states that her dizziness started six months ago at this time it was severe.  It has continued but not as severe.  She becomes dizzy twice a day sometimes the dizziness last all day long other times only a minute or so.  Pt states that she has a headache everyday all day long and she has extreme fatigue.  She has fallen one time in the past six months.     PERTINENT HISTORY:  62 y.o. female with a hx of atypical chest pain, palpitations, type 2 diabetes, hyperlipidemia, hypertension, vertigo, GERD, ADD, and anxiety/depression  PAIN:  Are you having pain? Yes: NPRS scale: 8/10 Pain location: starts in the neck and travels to her head she had a MRI which was fine.  Pain description: aching  Aggravating factors: random Relieving factors: mm relaxor, rest   PRECAUTIONS: None  WEIGHT BEARING RESTRICTIONS: No  FALLS: Has patient fallen in last 6 months? Yes. Number of  falls 1; fell up the steps   LIVING ENVIRONMENT: Lives with: lives with their family PLOF: Independent  PATIENT GOALS: to be able to work out again, have less dizziness, improved mental health.   OBJECTIVE:   POSTURE:  rounded shoulders, forward head, decreased lumbar lordosis, and increased thoracic kyphosis  Cervical ROM:    Active A/PROM (deg) eval  Flexion   Extension   Right lateral flexion   Left lateral flexion   Right rotation 65   Left rotation 65  (Blank rows = not tested)  PATIENT SURVEYS:  FOTO 50  VESTIBULAR ASSESSMENT:     SYMPTOM BEHAVIOR:   Non-Vestibular symptoms: neck pain  and headaches  Type of dizziness: Spinning/Vertigo, Unsteady with head/body turns, Lightheadedness/Faint, "Funny feeling in the head", and "World moves"  Frequency: twice a day  Duration: varies  Aggravating factors:  not sure  Relieving factors: medication and rest  Progression of symptoms: better   VESTIBULAR - OCULAR REFLEX:   Slow VOR: Positive Right, Positive Left, and Comment: Makes her head feel funny   VOR Cancellation: Normal       POSITIONAL TESTING: Right Dix-Hallpike: no nystagmus Left Dix-Hallpike: no nystagmus Right Roll Test: no nystagmus Left Roll Test: no nystagmus  (+) hypovestibular sx at 3 ( head turns at one per second while standing  MOTION SENSITIVITY:  Motion Sensitivity Quotient Intensity: 0 = none, 1 = Lightheaded, 2 = Mild, 3 = Moderate, 4 = Severe, 5 = Vomiting  Intensity  1. Sitting to supine   2. Supine to L side   3. Supine to R side   4. Supine to sitting   5. L Hallpike-Dix   6. Up from L    7. R Hallpike-Dix   8. Up from R    9. Sitting, head tipped to L knee   10. Head up from L knee   11. Sitting, head tipped to R knee   12. Head up from R knee   13. Sitting head turns x5   14.Sitting head nods x5   15. In stance, 180 turn to L    16. In stance, 180 turn to R          VESTIBULAR TREATMENT:                                                                                                   DATE: 01/06/23 Cervical retraction x 5 Shld ER with scapular retraction with red t-band x 10  Sitting horizontal eye pursuits x 5 Standing head turns one/second x 3 ( stopped as pt had sx )   PATIENT EDUCATION: Education details: HEP, the need for habitiuation  Person educated: Patient Education method: Explanation Education comprehension: verbalized understanding and returned demonstration  HOME EXERCISE PROGRAM: Access Code: [email or web address] URL: [email or web address] Date: 01/06/2023 Prepared by: Virgina Organ  Exercises - Seated Cervical Retraction  - 2 x daily - 7 x weekly - 1 sets - 10 reps - 3" hold - Seated Bilateral Shoulder External Rotation with Resistance at Wrists  - 2 x daily - 7 x weekly -  1 sets - 10 reps - 3-5" hold - Seated Horizontal Smooth Pursuit  - 3 x daily - 7 x weekly - 3 sets - 10 reps - Seated Cervical Rotation AROM  - 2 x daily - 7 x weekly - 1 sets - 10 reps - 5" hold - Seated Cervical Rotation AROM  - 3 x daily - 7 x weekly - 1 sets - 10 reps GOALS: Goals reviewed with patient? No  SHORT TERM GOALS: Target date: 01/27/23  PT to be I in HEP to allow sx of dizziness to decrease to no greater than one/ wk Baseline: Goal status: INITIAL LONG TERM GOALS: Target date: 02/17/23  PT to be I in advanced HEP to allow pt to state that she has had no sx of dizziness for one week  Baseline:  Goal status: INITIAL  2.  Pt to have 70 degrees of cervical rotation for improved safety of driving  Baseline:  Goal status: INITIAL     ASSESSMENT:  CLINICAL IMPRESSION: Patient is a 62 y.o. female  who was seen today for physical therapy evaluation and treatment for vertigo.  Evaluation demonstrates that pt does not have BPPV but more central vertigo sx and will benefit from habituation and balance exercises.   OBJECTIVE IMPAIRMENTS: dizziness.   ACTIVITY LIMITATIONS: stairs and locomotion level  PARTICIPATION LIMITATIONS: shopping and community activity  PERSONAL FACTORS: Time since onset of injury/illness/exacerbation are also affecting patient's functional outcome.   REHAB POTENTIAL: Good  CLINICAL DECISION MAKING: Stable/uncomplicated  EVALUATION COMPLEXITY: Low   PLAN:  PT FREQUENCY: 1x/week  PT DURATION: 4 weeks  PLANNED INTERVENTIONS: Therapeutic exercises, Therapeutic activity, Neuromuscular re-education, Balance training, Gait training, Patient/Family education, Self Care, Vestibular training, and Canalith repositioning  PLAN FOR NEXT SESSION: If  pt is able to complete 10 head turns try vertical nods, Single leg support, test head to knee for habituation    Virgina Organ, PT CLT [phone]  01/06/2023, 12:55 PM

## 2023-01-06 NOTE — Telephone Encounter (Signed)
LMOVM for patient to call the office back.

## 2023-01-08 DIAGNOSIS — E119 Type 2 diabetes mellitus without complications: Secondary | ICD-10-CM | POA: Diagnosis not present

## 2023-01-08 DIAGNOSIS — G43109 Migraine with aura, not intractable, without status migrainosus: Secondary | ICD-10-CM | POA: Diagnosis not present

## 2023-01-08 DIAGNOSIS — H2513 Age-related nuclear cataract, bilateral: Secondary | ICD-10-CM | POA: Diagnosis not present

## 2023-01-08 DIAGNOSIS — Z7984 Long term (current) use of oral hypoglycemic drugs: Secondary | ICD-10-CM | POA: Diagnosis not present

## 2023-01-18 ENCOUNTER — Ambulatory Visit (HOSPITAL_COMMUNITY): Payer: 59

## 2023-01-18 DIAGNOSIS — R42 Dizziness and giddiness: Secondary | ICD-10-CM

## 2023-01-18 DIAGNOSIS — R002 Palpitations: Secondary | ICD-10-CM | POA: Diagnosis not present

## 2023-01-18 DIAGNOSIS — E785 Hyperlipidemia, unspecified: Secondary | ICD-10-CM | POA: Diagnosis not present

## 2023-01-18 DIAGNOSIS — E119 Type 2 diabetes mellitus without complications: Secondary | ICD-10-CM | POA: Diagnosis not present

## 2023-01-18 DIAGNOSIS — I10 Essential (primary) hypertension: Secondary | ICD-10-CM | POA: Diagnosis not present

## 2023-01-18 DIAGNOSIS — R0602 Shortness of breath: Secondary | ICD-10-CM | POA: Diagnosis not present

## 2023-01-18 DIAGNOSIS — G4486 Cervicogenic headache: Secondary | ICD-10-CM | POA: Diagnosis not present

## 2023-01-18 NOTE — Therapy (Signed)
OUTPATIENT PHYSICAL THERAPY VESTIBULAR TREATMENT     Patient Name: Sarah Porter MRN: [id number] DOB:1961-08-05, 62 y.o., female Today's Date: 01/18/2023  END OF SESSION:  PT End of Session - 01/18/23 0950     Visit Number 2    Number of Visits 6    Date for PT Re-Evaluation 02/17/23    Authorization Type [employer] [facility]    Progress Note Due on Visit 6    PT Start Time [phone]    PT Stop Time 1028    PT Time Calculation (min) 40 min    Activity Tolerance Patient tolerated treatment well    Behavior During Therapy [facility] for tasks assessed/performed             Past Medical History:  Diagnosis Date   Anxiety    Depression    Essential hypertension    GERD (gastroesophageal reflux disease)    Hyperlipidemia    Type 2 diabetes mellitus (HCC)    Vertigo    Past Surgical History:  Procedure Laterality Date   BILATERAL KNEE REPAIRS     CESAREAN SECTION     x 2   COLONOSCOPY  11/04/2011   Dr. Edrick Kins; diminutive colon polyp in the rectum, otherwise normal exam.  Pathology with tubular adenoma.  Recommended 5-year repeat.   COLONOSCOPY  07/12/2014   Dr. Edrick Kins; submucosal hemorrhages in proximal descending colon?  Prior ischemic or infectious colitis?,  Otherwise normal exam.  Recommended 5-year repeat.   COLONOSCOPY WITH PROPOFOL N/A 07/22/2021   Procedure: COLONOSCOPY WITH PROPOFOL;  Surgeon: Lanelle Bal, DO;  [facility];  Service: Endoscopy;  Laterality: N/A;  8:45am   ESOPHAGOGASTRODUODENOSCOPY  07/21/2004   Dr. Edrick Kins; grade 2 erosive esophagitis at EG junction, cervical inlet patch, otherwise normal exam.   Patient Active Problem List   Diagnosis Date Noted   Left tennis elbow 12/17/2022   H/O adenomatous polyp of colon 06/25/2021   Tibialis posterior tendinitis, right 05/22/2020   Herniated nucleus pulposus, L4-5 right 05/22/2020   Arthritis of right ankle 05/22/2020   Pain in right ankle and joints of right foot 05/08/2020   Low  back pain 05/08/2020   Morbid (severe) obesity due to excess calories (HCC) 05/08/2020    PCP: Waldon Reining  REFERRING PROVIDER: Drema Dallas, DO  REFERRING DIAG:  Diagnosis  G44.86 (ICD-10-CM) - Cervicogenic headache  R42 (ICD-10-CM) - Dizziness    THERAPY DIAG:  Dizziness and giddiness  ONSET DATE: chronic   Rationale for Evaluation and Treatment: Rehabilitation    SUBJECTIVE STATEMENT: Patient reports little change in dizziness but may have slight reduction in headaches.  Feels mostly off balance; is also having massage therapy in conjunction with therapy. [facility]     Eval:Pt states that her dizziness started six months ago at this time it was severe.  It has continued but not as severe.  She becomes dizzy twice a day sometimes the dizziness last all day long other times only a minute or so.  Pt states that she has a headache everyday all day long and she has extreme fatigue.  She has fallen one time in the past six months.     PERTINENT HISTORY:  62 y.o. female with a hx of atypical chest pain, palpitations, type 2 diabetes, hyperlipidemia, hypertension, vertigo, GERD, ADD, and anxiety/depression  PAIN:  Are you having pain? Yes: NPRS scale: 8/10 Pain location:  starts in the neck and travels to her head she had a MRI which was fine.  Pain description: aching  Aggravating factors: random Relieving factors: mm relaxor, rest   PRECAUTIONS: None  WEIGHT BEARING RESTRICTIONS: No  FALLS: Has patient fallen in last 6 months? Yes. Number of falls 1; fell up the steps   LIVING ENVIRONMENT: Lives with: lives with their family PLOF: Independent  PATIENT GOALS: to be able to work out again, have less dizziness, improved mental health.   OBJECTIVE:   POSTURE:  rounded shoulders, forward head, decreased lumbar lordosis, and increased thoracic kyphosis  Cervical ROM:    Active A/PROM (deg) eval   Flexion   Extension   Right lateral flexion   Left lateral flexion   Right rotation 65   Left rotation 65  (Blank rows = not tested)  PATIENT SURVEYS:  FOTO 50  VESTIBULAR ASSESSMENT:     SYMPTOM BEHAVIOR:   Non-Vestibular symptoms: neck pain and headaches  Type of dizziness: Spinning/Vertigo, Unsteady with head/body turns, Lightheadedness/Faint, "Funny feeling in the head", and "World moves"  Frequency: twice a day  Duration: varies  Aggravating factors:  not sure  Relieving factors: medication and rest  Progression of symptoms: better   VESTIBULAR - OCULAR REFLEX:   Slow VOR: Positive Right, Positive Left, and Comment: Makes her head feel funny   VOR Cancellation: Normal       POSITIONAL TESTING: Right Dix-Hallpike: no nystagmus Left Dix-Hallpike: no nystagmus Right Roll Test: no nystagmus Left Roll Test: no nystagmus  (+) hypovestibular sx at 3 ( head turns at one per second while standing  MOTION SENSITIVITY:  Motion Sensitivity Quotient Intensity: 0 = none, 1 = Lightheaded, 2 = Mild, 3 = Moderate, 4 = Severe, 5 = Vomiting  Intensity  1. Sitting to supine   2. Supine to L side   3. Supine to R side   4. Supine to sitting   5. L Hallpike-Dix   6. Up from L    7. R Hallpike-Dix   8. Up from R    9. Sitting, head tipped to L knee 2  10. Head up from L knee 2  11. Sitting, head tipped to R knee 2  12. Head up from R knee 2  13. Sitting head turns x5 2  14.Sitting head nods x5   15. In stance, 180 turn to L    16. In stance, 180 turn to R          VESTIBULAR TREATMENT:                                                                                                   DATE: 01/18/23 Review of HEP and goals Seated Cervical rectractions x 10 Seated Shoulder ER with scapular retraction with RTB x 15 Seated horizontal smooth pursuit x 10 each side Seated Cervical rotation x 10 x 5" each Standing Head nods and turns gaze stabilization x 10 (no  dizziness) Standing Head turns standing with reading words and numbers x 1 time each Tandem stance with head turns Sitting Nose to  each knee x 5 each      01/06/23 Cervical retraction x 5 Shld ER with scapular retraction with red t-band x 10  Sitting horizontal eye pursuits x 5 Standing head turns one/second x 3 ( stopped as pt had sx )   PATIENT EDUCATION: Education details: HEP, the need for habitiuation  Person educated: Patient Education method: Explanation Education comprehension: verbalized understanding and returned demonstration  HOME EXERCISE PROGRAM: 01/18/23 head turns reading numbers and words, nose to knee; tandem stance with head turns  Access Code: [email or web address] URL: [email or web address] Date: 01/06/2023 Prepared by: Virgina Organ  Exercises - Seated Cervical Retraction  - 2 x daily - 7 x weekly - 1 sets - 10 reps - 3" hold - Seated Bilateral Shoulder External Rotation with Resistance at Wrists  - 2 x daily - 7 x weekly - 1 sets - 10 reps - 3-5" hold - Seated Horizontal Smooth Pursuit  - 3 x daily - 7 x weekly - 3 sets - 10 reps - Seated Cervical Rotation AROM  - 2 x daily - 7 x weekly - 1 sets - 10 reps - 5" hold - Seated Cervical Rotation AROM  - 3 x daily - 7 x weekly - 1 sets - 10 reps GOALS: Goals reviewed with patient? No  SHORT TERM GOALS: Target date: 01/27/23  PT to be I in HEP to allow sx of dizziness to decrease to no greater than one/ wk Baseline: Goal status: IN PROGRESS LONG TERM GOALS: Target date: 02/17/23  PT to be I in advanced HEP to allow pt to state that she has had no sx of dizziness for one week  Baseline:  Goal status: IN PROGRESS  2.  Pt to have 70 degrees of cervical rotation for improved safety of driving  Baseline:  Goal status: IN PROGRESS     ASSESSMENT:  CLINICAL IMPRESSION: Today's session started with a review of HEP and goals; patient verbalizes agreement with set rehab goals; needs cues for correct  performance of cervical retractions.  Progress gaze stabilization and balance activity today with good challenge with tandem stance and with nose to knees; mild dizziness reported with nose to knees and standing head turns with reading words and numbers.  updated HEP.  Patient will benefit from continued skilled therapy services during the remainder of her hospital stay and at the next recommended venue of care to address deficits and promote return to optimal function.      Eval:Patient is a 62 y.o. female  who was seen today for physical therapy evaluation and treatment for vertigo.  Evaluation demonstrates that pt does not have BPPV but more central vertigo sx and will benefit from habituation and balance exercises.   OBJECTIVE IMPAIRMENTS: dizziness.   ACTIVITY LIMITATIONS: stairs and locomotion level  PARTICIPATION LIMITATIONS: shopping and community activity  PERSONAL FACTORS: Time since onset of injury/illness/exacerbation are also affecting patient's functional outcome.   REHAB POTENTIAL: Good  CLINICAL DECISION MAKING: Stable/uncomplicated  EVALUATION COMPLEXITY: Low   PLAN:  PT FREQUENCY: 1x/week  PT DURATION: 4 weeks  PLANNED INTERVENTIONS: Therapeutic exercises, Therapeutic activity, Neuromuscular re-education, Balance training, Gait training, Patient/Family education, Self Care, Vestibular training, and Canalith repositioning  PLAN FOR NEXT SESSION: If pt is able to complete 10 head turns try vertical nods, Single leg support,progress gaze stabilization and balance    10:40 AM, 01/18/23 Blaire Hodsdon Small Quenten Nawaz MPT [facility] [place] [phone] [phone]

## 2023-01-20 DIAGNOSIS — F5101 Primary insomnia: Secondary | ICD-10-CM | POA: Diagnosis not present

## 2023-01-20 DIAGNOSIS — G4733 Obstructive sleep apnea (adult) (pediatric): Secondary | ICD-10-CM | POA: Diagnosis not present

## 2023-01-20 DIAGNOSIS — G2581 Restless legs syndrome: Secondary | ICD-10-CM | POA: Diagnosis not present

## 2023-01-26 ENCOUNTER — Ambulatory Visit (HOSPITAL_COMMUNITY): Payer: 59 | Admitting: Physical Therapy

## 2023-01-26 DIAGNOSIS — E119 Type 2 diabetes mellitus without complications: Secondary | ICD-10-CM | POA: Diagnosis not present

## 2023-01-26 DIAGNOSIS — I10 Essential (primary) hypertension: Secondary | ICD-10-CM | POA: Diagnosis not present

## 2023-01-26 DIAGNOSIS — R42 Dizziness and giddiness: Secondary | ICD-10-CM

## 2023-01-26 DIAGNOSIS — R002 Palpitations: Secondary | ICD-10-CM | POA: Diagnosis not present

## 2023-01-26 DIAGNOSIS — E785 Hyperlipidemia, unspecified: Secondary | ICD-10-CM | POA: Diagnosis not present

## 2023-01-26 DIAGNOSIS — G4486 Cervicogenic headache: Secondary | ICD-10-CM | POA: Diagnosis not present

## 2023-01-26 DIAGNOSIS — R0602 Shortness of breath: Secondary | ICD-10-CM | POA: Diagnosis not present
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
## 2023-01-28 ENCOUNTER — Ambulatory Visit: Payer: 59 | Admitting: Bariatrics

## 2023-01-28 ENCOUNTER — Encounter: Payer: Self-pay | Admitting: Bariatrics

## 2023-01-28 VITALS — BP 100/65 | HR 61 | Temp 97.5°F | Ht 61.5 in | Wt 217.0 lb

## 2023-01-28 DIAGNOSIS — G4700 Insomnia, unspecified: Secondary | ICD-10-CM | POA: Diagnosis not present

## 2023-01-28 DIAGNOSIS — Z6841 Body Mass Index (BMI) 40.0 and over, adult: Secondary | ICD-10-CM | POA: Diagnosis not present

## 2023-01-28 DIAGNOSIS — E7849 Other hyperlipidemia: Secondary | ICD-10-CM

## 2023-01-28 DIAGNOSIS — E65 Localized adiposity: Secondary | ICD-10-CM

## 2023-01-28 DIAGNOSIS — E785 Hyperlipidemia, unspecified: Secondary | ICD-10-CM | POA: Diagnosis not present

## 2023-01-28 DIAGNOSIS — Z0289 Encounter for other administrative examinations: Secondary | ICD-10-CM

## 2023-01-28 NOTE — Progress Notes (Signed)
Office: [phone]  /  Fax: [phone]   Initial Visit  Sarah Porter was seen in clinic today to evaluate for obesity. She is interested in losing weight to improve overall health and reduce the risk of weight related complications. She presents today to review program treatment options, initial physical assessment, and evaluation.     She was referred by: Specialist (cardiologist).   When asked what else they would like to accomplish? She states: Adopt healthier eating patterns, Improve existing medical conditions, Reduce number of medications, and Improve quality of life  When asked how has your weight affected you? She states: Contributed to medical problems, Having fatigue, and Having poor endurance  Some associated conditions: Hyperlipidemia and Heart disease H/O of bulmia.   Contributing factors: Family history, Nutritional, and Reduced physical activity  Weight promoting medications identified: Beta-blockers and Steroids  Current nutrition plan: Other: Weight Watchers.  and Keto.   Current level of physical activity: None and Other: water aerobics  Current or previous pharmacotherapy: GLP-1 (Victoza), Phentermine.   Response to medication: Victoza Other: Just started last months.   Past medical history includes:   Past Medical History:  Diagnosis Date   Anxiety    Depression    Essential hypertension    GERD (gastroesophageal reflux disease)    Hyperlipidemia    Type 2 diabetes mellitus (HCC)    Vertigo      Objective:   There were no vitals taken for this visit. She was weighed on the bioimpedance scale: There is no height or weight on file to calculate BMI.  Peak Weight:230 lbs , Body Fat%:50.6 %, Visceral Fat Rating:16, Weight trend over the last 12 months: Increasing  General:  Alert, oriented and cooperative. Patient is in no acute distress.  Respiratory: Normal respiratory effort, no problems with respiration noted  Extremities: Normal range of  motion.    Mental Status: Normal mood and affect. Normal behavior. Normal judgment and thought content.   DIAGNOSTIC DATA REVIEWED:  BMET No results found for: "NA", "K", "CL", "CO2", "GLUCOSE", "BUN", "CREATININE", "CALCIUM", "GFRNONAA", "GFRAA" No results found for: "HGBA1C" No results found for: "INSULIN" CBC No results found for: "WBC", "RBC", "HGB", "HCT", "PLT", "MCV", "MCH", "MCHC", "RDW" Iron/TIBC/Ferritin/ %Sat No results found for: "IRON", "TIBC", "FERRITIN", "IRONPCTSAT" Lipid Panel  No results found for: "CHOL", "TRIG", "HDL", "CHOLHDL", "VLDL", "LDLCALC", "LDLDIRECT" Hepatic Function Panel  No results found for: "PROT", "ALBUMIN", "AST", "ALT", "ALKPHOS", "BILITOT", "BILIDIR", "IBILI" No results found for: "TSH"   Assessment and Plan:   Visceral Obesity.   She has a visceral fat rating of 16 per the bio-impedence scale.   Plan: The goal is a visceral fat rating of 13 or below.  Will work on the plan and increase exercise/begin exercise.  Information sheet on " Tips to lose belly fat ". Aware that belly fat may be equate to visceral fat, but many of the same tips can help both subcutaneous and visceral fat.  Information sheet on " Healthy and Unhealthy fats.  Will minimize all carbohydrates ( sweets and starches ).    Hyperlipidemia  Medication(s): Lipitor Cardiovascular risk factors: dyslipidemia, obesity (BMI >= 30 kg/m2), and sedentary lifestyle  No results found for: "CHOL", "HDL", "LDLCALC", "LDLDIRECT", "TRIG", "CHOLHDL" No results found for: "ALT", "AST", "GGT", "ALKPHOS", "BILITOT" The ASCVD Risk score (Arnett DK, et al., 2019) failed to calculate for the following reasons:   Cannot find a previous HDL lab   Cannot find a previous total cholesterol lab  Plan:  Continue  statin.  Will avoid all trans fats.  Will read labels Will minimize saturated fats except the following: low fat meats in moderation, diary, and limited dark chocolate.     Insomnia:  She had a recent sleep test with an AHI of 7.8 (very mild)   Morbid Obesity: Current BMI 40    Obesity Treatment / Action Plan:  Patient will work on garnering support from family and friends to begin weight loss journey. Will work on eliminating or reducing the presence of highly palatable, calorie dense foods in the home. Will complete provided nutritional and psychosocial assessment questionnaire before the next appointment. Will be scheduled for indirect calorimetry to determine resting energy expenditure in a fasting state.  This will allow [place] to create a reduced calorie, high-protein meal plan to promote loss of fat mass while preserving muscle mass. Counseled on the health benefits of losing 5%-15% of total body weight. Was counseled on nutritional approaches to weight loss and benefits of reducing processed foods and consuming plant-based foods and high quality protein as part of nutritional weight management. Was counseled on pharmacotherapy and role as an adjunct in weight management.   Obesity Education Performed Today:  She was weighed on the bioimpedance scale and results were discussed and documented in the synopsis.  We discussed obesity as a disease and the importance of a more detailed evaluation of all the factors contributing to the disease.  We discussed the importance of long term lifestyle changes which include nutrition, exercise and behavioral modifications as well as the importance of customizing this to her specific health and social needs.  We discussed the benefits of reaching a healthier weight to alleviate the symptoms of existing conditions and reduce the risks of the biomechanical, metabolic and psychological effects of obesity.  Discussed New Patient/Late Arrival, and Cancellation Policies. Patient voiced understanding and allowed to ask questions.   Sarah Porter appears to be in the action stage of change and states they are ready to start  intensive lifestyle modifications and behavioral modifications.  30 minutes was spent today on this visit including the above counseling, pre-visit chart review, and post-visit documentation.  Reviewed by clinician on day of visit: allergies, medications, problem list, medical history, surgical history, family history, social history, and previous encounter notes.    Nichalas Coin A. Lorretta HarpO.

## 2023-01-29 ENCOUNTER — Ambulatory Visit: Payer: 59 | Admitting: Nurse Practitioner

## 2023-01-29 ENCOUNTER — Encounter: Payer: Self-pay | Admitting: Nurse Practitioner

## 2023-01-29 VITALS — BP 122/82 | HR 66 | Ht 61.0 in | Wt 220.6 lb

## 2023-01-29 DIAGNOSIS — E119 Type 2 diabetes mellitus without complications: Secondary | ICD-10-CM | POA: Diagnosis not present

## 2023-01-29 DIAGNOSIS — I10 Essential (primary) hypertension: Secondary | ICD-10-CM

## 2023-01-29 DIAGNOSIS — Z7984 Long term (current) use of oral hypoglycemic drugs: Secondary | ICD-10-CM

## 2023-01-29 DIAGNOSIS — E785 Hyperlipidemia, unspecified: Secondary | ICD-10-CM

## 2023-01-29 DIAGNOSIS — R002 Palpitations: Secondary | ICD-10-CM | POA: Diagnosis not present

## 2023-01-29 DIAGNOSIS — R0602 Shortness of breath: Secondary | ICD-10-CM

## 2023-01-29 NOTE — Patient Instructions (Signed)
Medication Instructions:  Continue all current medications.   Labwork: none  Testing/Procedures: none  Follow-Up: 6 months   Any Other Special Instructions Will Be Listed Below (If Applicable).   If you need a refill on your cardiac medications before your next appointment, please call your pharmacy.  

## 2023-01-29 NOTE — Progress Notes (Unsigned)
Office Visit    Patient Name: Sarah Porter Date of Encounter: 01/29/2023 PCP:  Waldon Reining, MD   [facility]  Cardiologist:  [name], MD  Advanced Practice Provider:  No care team member to display Electrophysiologist:  None   Chief Complaint    Sarah Porter is a 62 y.o. female with a hx of atypical chest pain, palpitations, type 2 diabetes, hyperlipidemia, hypertension, vertigo, GERD, ADD, morbid obesity, and anxiety/depression, who presents today for follow-up.  Past Medical History    Past Medical History:  Diagnosis Date   Anxiety    Depression    Essential hypertension    GERD (gastroesophageal reflux disease)    Hyperlipidemia    Type 2 diabetes mellitus (HCC)    Vertigo    Past Surgical History:  Procedure Laterality Date   BILATERAL KNEE REPAIRS     CESAREAN SECTION     x 2   COLONOSCOPY  11/04/2011   Dr. Edrick Kins; diminutive colon polyp in the rectum, otherwise normal exam.  Pathology with tubular adenoma.  Recommended 5-year repeat.   COLONOSCOPY  07/12/2014   Dr. Edrick Kins; submucosal hemorrhages in proximal descending colon?  Prior ischemic or infectious colitis?,  Otherwise normal exam.  Recommended 5-year repeat.   COLONOSCOPY WITH PROPOFOL N/A 07/22/2021   Procedure: COLONOSCOPY WITH PROPOFOL;  Surgeon: Lanelle Bal, DO;  Location: [facility];  Service: Endoscopy;  Laterality: N/A;  8:45am   ESOPHAGOGASTRODUODENOSCOPY  07/21/2004   Dr. Edrick Kins; grade 2 erosive esophagitis at EG junction, cervical inlet patch, otherwise normal exam.    Allergies  No Known Allergies  History of Present Illness    Sarah Porter is a 62 y.o. female with a PMH as mentioned above.   Last seen by Dr. Diona Browner on April 29, 2022.  She had noted intermittent chest discomfort at that time, noted as a pressure/indigestion sensation in the central sternal area, noted neck discomfort.  Her symptoms are nonexertional.  She was  very active at the time.  She lost over 30 pounds over the past several months, and changed her diet.  She underwent Myoview for evaluation, was deemed a low risk study.  I last saw her for follow-up on 12/29/2022.  Her Chief concern was shortness of breath when she would bend over to tie her shoes, also experienced "heart pounding" and nausea, had been ongoing for the past several months.  Attributes this to her weight gain.  Was diagnosed with fibromyalgia, sees Dr. Everlena Cooper with Neurology. Denied any chest pain, syncope, presyncope, dizziness, orthopnea, PND, swelling or significant weight changes, acute bleeding, or claudication. Referred to [facility].   Today she presents for follow-up. Had initial visit with [facility], has upcoming labs arranged with them. No change in symptoms. Attributes her symptoms to her weight. Denies any chest pain, palpitations, syncope, presyncope, dizziness, orthopnea, PND, swelling or significant weight changes, acute bleeding, or claudication.   EKGs/Labs/Other Studies Reviewed:   The following studies were reviewed today:   EKG:  EKG is not ordered today.    Myoview 05/2022:   Findings are consistent with no prior ischemia. The study is low risk.   No ST deviation was noted.   Left ventricular function is normal. End diastolic cavity size is normal. End systolic cavity size is normal.   Prior study not available for comparison.   IMPRESSIONS Negative for stress induced arrhythmias.  Stress ECG nondiagnostic due to pharmacologic protocol.  Normal left ventricular function and size. There is an anteroseptal perfusion defect in rest that that partial improves with stress and with normal wall motion.  Artifact is favored over infarct. There is diaphragmatic activity at rest that is not present at stress.   CONCLUSIONS No ischemia. Low risk study.   Cardiac monitor 02/2022: Zio XT reviewed. 6 days, 16 hours analyzed:    Predominant rhythm is sinus with prolonged PR interval. Heart rate ranged from 47 bpm up to 106 bpm with average heart rate 65 bpm. There were rare PACs including couplets and triplets representing less than 1% total beats. There were rare PVCs representing less than 1% total beats. Single, very brief episode of SVT noted lasting 6 beats. Very brief and rare dropped beats were noted (second degree type 1 or 2). No patient triggered events or diary entry associated with obvious arrhythmia.    Recent Labs: No results found for requested labs within last 365 days.  Recent Lipid Panel No results found for: "CHOL", "TRIG", "HDL", "CHOLHDL", "VLDL", "LDLCALC", "LDLDIRECT"   Home Medications   Current Meds  Medication Sig   AIMOVIG 140 MG/ML SOAJ as directed Subcutaneous monthly   atorvastatin (LIPITOR) 80 MG tablet Take 80 mg by mouth in the morning.   cholecalciferol (VITAMIN D3) 25 MCG (1000 UNIT) tablet Take 1,000 Units by mouth daily.   DULoxetine (CYMBALTA) 60 MG capsule Take 60 mg by mouth daily.   lisinopril (ZESTRIL) 5 MG tablet Take 5 mg by mouth in the morning.   magnesium 30 MG tablet Take 30 mg by mouth 2 (two) times daily.   metFORMIN (GLUCOPHAGE) 500 MG tablet Take 500 mg by mouth daily with breakfast.   metoprolol succinate (TOPROL-XL) 25 MG 24 hr tablet Take 25 mg by mouth in the morning.   Multiple Vitamin (MULTIVITAMIN WITH MINERALS) TABS tablet Take 1 tablet by mouth in the morning.   omeprazole (PRILOSEC) 20 MG capsule Take 20 mg by mouth daily.   pregabalin (LYRICA) 50 MG capsule Take 50 mg by mouth 2 (two) times daily. 50 mg at night 50mg  in the morning   tiZANidine (ZANAFLEX) 2 MG tablet Take 1 tablet (2 mg total) by mouth 3 (three) times daily as needed for muscle spasms.   UBRELVY 100 MG TABS Take by mouth.   VICTOZA 18 MG/3ML SOPN Inject into the skin.   vitamin C (ASCORBIC ACID) 500 MG tablet Take 500 mg by mouth in the morning.     Review of Systems    All  other systems reviewed and are otherwise negative except as noted above.  Physical Exam    VS:  BP 122/82   Pulse 66   Ht 5\' 1"  (1.549 m)   Wt 220 lb 9.6 oz (100.1 kg)   SpO2 98%   BMI 41.68 kg/m  , BMI Body mass index is 41.68 kg/m.  Wt Readings from Last 3 Encounters:  01/29/23 220 lb 9.6 oz (100.1 kg)  01/28/23 217 lb (98.4 kg)  12/29/22 217 lb 6.4 oz (98.6 kg)     GEN: Morbidly obese, 62 y.o. female in no acute distress. HEENT: normal. Neck: Supple, no JVD, carotid bruits, or masses. Cardiac: S1/S2, RRR, no murmurs, rubs, or gallops. No clubbing, cyanosis, edema.  Radials/PT 2+ and equal bilaterally.  Respiratory:  Respirations regular and unlabored, clear to auscultation bilaterally. MS: No deformity or atrophy. Skin: Warm and dry, no rash. Neuro:  Strength and sensation are intact. Psych: Normal affect.  Assessment & Plan  Palpitations Monitor 02/2022 revealed predominantly normal sinus rhythm, rare PACs/PVCs, 1 episode of SVT, brief.  Notes palpitations (denies tachycardia)  when bending over, etiology seems most likely related to #5.  Continue to monitor at this time.  Continue metoprolol. Heart healthy diet and regular cardiovascular exercise encouraged.   Type 2 Diabetes I do not see a recent A1c on file.  This is being managed by PCP.  Continue follow-up with Dr. Dahlia Client. Continue metformin. Heart healthy diet and regular cardiovascular exercise encouraged. Continue to follow-up with [facility].   HLD Do not see a recent lipid panel on file.  Continue atorvastatin. Has upcoming labs with [facility]. Heart healthy diet and regular cardiovascular exercise encouraged.   HTN Blood pressure stable.  Continue lisinopril and metoprolol succinate. Discussed to monitor BP at home at least 2 hours after medications and sitting for 5-10 minutes. Heart healthy diet and regular cardiovascular exercise encouraged.   Morbid  obesity, shortness of breath BMI today 41.68.  Experiences shortness of breath when bending over to tie her shoes, attributes this to her weight, which I am in agreement with. Continue to follow-up with [facility]. Myoview 05/2022 low risk. Continue current medication regimen.  Heart healthy diet and regular cardiovascular exercise encouraged. Continue current medication regimen. At next visit if no improvement in symptoms after weight loss, consider obtaining Echo.   Disposition: Follow up in 6 months with [name], MD or APP.  Signed, Sharlene Dory, NP 01/30/2023, 2:03 PM  [facility]

## 2023-02-02 ENCOUNTER — Encounter (HOSPITAL_COMMUNITY): Payer: 59 | Admitting: Physical Therapy

## 2023-02-08 DIAGNOSIS — M25551 Pain in right hip: Secondary | ICD-10-CM | POA: Diagnosis not present

## 2023-02-08 DIAGNOSIS — M79642 Pain in left hand: Secondary | ICD-10-CM | POA: Diagnosis not present

## 2023-02-08 DIAGNOSIS — M549 Dorsalgia, unspecified: Secondary | ICD-10-CM | POA: Diagnosis not present

## 2023-02-08 DIAGNOSIS — M79671 Pain in right foot: Secondary | ICD-10-CM | POA: Diagnosis not present

## 2023-02-08 DIAGNOSIS — M797 Fibromyalgia: Secondary | ICD-10-CM | POA: Diagnosis not present

## 2023-02-08 DIAGNOSIS — M25562 Pain in left knee: Secondary | ICD-10-CM | POA: Diagnosis not present

## 2023-02-08 DIAGNOSIS — M1990 Unspecified osteoarthritis, unspecified site: Secondary | ICD-10-CM | POA: Diagnosis not present

## 2023-02-08 DIAGNOSIS — M25561 Pain in right knee: Secondary | ICD-10-CM | POA: Diagnosis not present

## 2023-02-08 DIAGNOSIS — M79641 Pain in right hand: Secondary | ICD-10-CM | POA: Diagnosis not present

## 2023-02-08 DIAGNOSIS — M25571 Pain in right ankle and joints of right foot: Secondary | ICD-10-CM | POA: Diagnosis not present

## 2023-02-08 DIAGNOSIS — M109 Gout, unspecified: Secondary | ICD-10-CM | POA: Diagnosis not present

## 2023-02-08 DIAGNOSIS — M25572 Pain in left ankle and joints of left foot: Secondary | ICD-10-CM | POA: Diagnosis not present

## 2023-02-08 DIAGNOSIS — M79672 Pain in left foot: Secondary | ICD-10-CM | POA: Diagnosis not present

## 2023-02-17 ENCOUNTER — Ambulatory Visit: Payer: 59 | Admitting: Bariatrics

## 2023-02-17 ENCOUNTER — Encounter: Payer: Self-pay | Admitting: Bariatrics

## 2023-02-17 VITALS — BP 114/71 | HR 56 | Temp 97.7°F | Ht 61.5 in | Wt 215.0 lb

## 2023-02-17 DIAGNOSIS — R0602 Shortness of breath: Secondary | ICD-10-CM

## 2023-02-17 DIAGNOSIS — R7303 Prediabetes: Secondary | ICD-10-CM | POA: Diagnosis not present

## 2023-02-17 DIAGNOSIS — Z6841 Body Mass Index (BMI) 40.0 and over, adult: Secondary | ICD-10-CM | POA: Diagnosis not present

## 2023-02-17 DIAGNOSIS — R5383 Other fatigue: Secondary | ICD-10-CM | POA: Diagnosis not present

## 2023-02-17 DIAGNOSIS — Z1331 Encounter for screening for depression: Secondary | ICD-10-CM | POA: Diagnosis not present

## 2023-02-17 DIAGNOSIS — M797 Fibromyalgia: Secondary | ICD-10-CM | POA: Diagnosis not present

## 2023-02-17 DIAGNOSIS — Z0000 Encounter for general adult medical examination without abnormal findings: Secondary | ICD-10-CM

## 2023-02-17 DIAGNOSIS — E7849 Other hyperlipidemia: Secondary | ICD-10-CM

## 2023-02-17 DIAGNOSIS — E559 Vitamin D deficiency, unspecified: Secondary | ICD-10-CM | POA: Diagnosis not present

## 2023-02-18 LAB — COMPREHENSIVE METABOLIC PANEL
ALT: 34 IU/L — ABNORMAL HIGH (ref 0–32)
AST: 31 IU/L (ref 0–40)
Albumin: 4.4 g/dL (ref 3.9–4.9)
Alkaline Phosphatase: 98 IU/L (ref 44–121)
BUN/Creatinine Ratio: 20 (ref 12–28)
BUN: 15 mg/dL (ref 8–27)
Bilirubin Total: 0.5 mg/dL (ref 0.0–1.2)
CO2: 25 mmol/L (ref 20–29)
Calcium: 9.7 mg/dL (ref 8.7–10.3)
Chloride: 101 mmol/L (ref 96–106)
Creatinine, Ser: 0.75 mg/dL (ref 0.57–1.00)
Globulin, Total: 2.2 g/dL (ref 1.5–4.5)
Glucose: 99 mg/dL (ref 70–99)
Potassium: 4.8 mmol/L (ref 3.5–5.2)
Sodium: 139 mmol/L (ref 134–144)
Total Protein: 6.6 g/dL (ref 6.0–8.5)
eGFR: 90 mL/min/{1.73_m2} (ref >59)

## 2023-02-18 LAB — CBC WITH DIFFERENTIAL/PLATELET
Basophils Absolute: 0 10*3/uL (ref 0.0–0.2)
Basos: 1 %
EOS (ABSOLUTE): 0.3 10*3/uL (ref 0.0–0.4)
Eos: 5 %
Hematocrit: 40.3 % (ref 34.0–46.6)
Hemoglobin: 13.1 g/dL (ref 11.1–15.9)
Immature Grans (Abs): 0 10*3/uL (ref 0.0–0.1)
Immature Granulocytes: 0 %
Lymphocytes Absolute: 2.2 10*3/uL (ref 0.7–3.1)
Lymphs: 35 %
MCH: 27.9 pg (ref 26.6–33.0)
MCHC: 32.5 g/dL (ref 31.5–35.7)
MCV: 86 fL (ref 79–97)
Monocytes Absolute: 0.6 10*3/uL (ref 0.1–0.9)
Monocytes: 9 %
Neutrophils Absolute: 3 10*3/uL (ref 1.4–7.0)
Neutrophils: 50 %
Platelets: 339 10*3/uL (ref 150–450)
RBC: 4.7 x10E6/uL (ref 3.77–5.28)
RDW: 11.9 % (ref 11.7–15.4)
WBC: 6.1 10*3/uL (ref 3.4–10.8)

## 2023-02-18 LAB — LIPID PANEL WITH LDL/HDL RATIO
Cholesterol, Total: 213 mg/dL — ABNORMAL HIGH (ref 100–199)
HDL: 83 mg/dL (ref >39)
LDL Chol Calc (NIH): 112 mg/dL — ABNORMAL HIGH (ref 0–99)
LDL/HDL Ratio: 1.3 ratio (ref 0.0–3.2)
Triglycerides: 104 mg/dL (ref 0–149)
VLDL Cholesterol Cal: 18 mg/dL (ref 5–40)

## 2023-02-18 LAB — TSH+T4F+T3FREE
Free T4: 1.11 ng/dL (ref 0.82–1.77)
T3, Free: 3.2 pg/mL (ref 2.0–4.4)
TSH: 1.03 u[IU]/mL (ref 0.450–4.500)

## 2023-02-18 LAB — VITAMIN D 25 HYDROXY (VIT D DEFICIENCY, FRACTURES): Vit D, 25-Hydroxy: 44 ng/mL (ref 30.0–100.0)

## 2023-02-18 LAB — HEMOGLOBIN A1C
Est. average glucose Bld gHb Est-mCnc: 126 mg/dL
Hgb A1c MFr Bld: 6 % — ABNORMAL HIGH (ref 4.8–5.6)

## 2023-02-18 LAB — INSULIN, RANDOM: INSULIN: 8.1 u[IU]/mL (ref 2.6–24.9)

## 2023-02-23 NOTE — Progress Notes (Unsigned)
Chief Complaint:   OBESITY Sarah Porter (MR# [id number]) is a 62 y.o. female who presents for evaluation and treatment of obesity and related comorbidities. Current BMI is Body mass index is 39.97 kg/m. Sarah Porter has been struggling with her weight for many years and has been unsuccessful in either losing weight, maintaining weight loss, or reaching her healthy weight goal.  Sarah Porter is currently in the action stage of change and ready to dedicate time achieving and maintaining a healthier weight. Sarah Porter is interested in becoming our patient and working on intensive lifestyle modifications including (but not limited to) diet and exercise for weight loss.  Patient's information session was done with me on 01/28/2023.  She is hungry at night and eats at night (watermelon and sometimes a meal).  Sarah Porter's habits were reviewed today and are as follows: Her family eats meals together, her desired weight loss is 65-85 lbs, she has been heavy most of her life, she started gaining weight as a child, then as an adult, her heaviest weight ever was 230 pounds, she has significant food cravings issues, she snacks frequently in the evenings, she wakes up frequently in the middle of the night to eat, she skips meals frequently, she is frequently drinking liquids with calories, she frequently makes poor food choices, she has problems with excessive hunger, and she struggles with emotional eating.  Depression Screen Sarah Porter's Food and Mood (modified PHQ-9) score was 18.  Subjective:   1. Other fatigue Sarah Porter admits to daytime somnolence and admits to waking up still tired. Patient has a history of symptoms of daytime fatigue, morning fatigue, and morning headache. Sarah Porter generally gets 5 or 6 hours of sleep per night, and states that she has nightime awakenings. Snoring is not present. Apneic episodes are not present. Epworth Sleepiness Score is 7.   2. SOB (shortness of breath) on exertion Sarah Porter notes increasing  shortness of breath with exercising and seems to be worsening over time with weight gain. She notes getting out of breath sooner with activity than she used to. This has not gotten worse recently. Sarah Porter denies shortness of breath at rest or orthopnea.  3. Pre-diabetes Patient is taking metformin and Victoza with no side effects noted.  4. Fibromyalgia Patient is seeing the Rheumatologist.  5. Health care maintenance Given obesity.   6. Other hyperlipidemia Patient is taking atorvastatin.  7. Vitamin D deficiency Patient is taking vitamin D.  Assessment/Plan:   1. Other fatigue Sarah Porter does feel that her weight is causing her energy to be lower than it should be. Fatigue may be related to obesity, depression or many other causes. Labs will be ordered, and in the meanwhile, Sarah Porter will focus on self care including making healthy food choices, increasing physical activity and focusing on stress reduction.  - EKG 12-Lead - TSH+T4F+T3Free - CBC with Differential/Platelet  2. SOB (shortness of breath) on exertion Sarah Porter does feel that she gets out of breath more easily that she used to when she exercises. Sarah Porter's shortness of breath appears to be obesity related and exercise induced. She has agreed to work on weight loss and gradually increase exercise to treat her exercise induced shortness of breath. Will continue to monitor closely.  - TSH+T4F+T3Free - CBC with Differential/Platelet  3. Pre-diabetes Patient will continue metformin and Victoza.  We will check labs today, and we will follow-up at her next visit.  - Insulin, random - Hemoglobin A1c  4. Fibromyalgia We will check labs today.  Patient will  continue to follow-up with her Rheumatologist.  - TSH+T4F+T3Free - Comprehensive metabolic panel - CBC with Differential/Platelet  5. Health care maintenance We will check labs today.  EKG and IC were done today and reviewed with the patient.  - TSH+T4F+T3Free - VITAMIN D 25  Hydroxy (Vit-D Deficiency, Fractures) - Lipid Panel With LDL/HDL Ratio - Insulin, random - Hemoglobin A1c - Comprehensive metabolic panel - CBC with Differential/Platelet  6. Other hyperlipidemia We will check labs today.  Patient will continue with her medications as directed.  - Lipid Panel With LDL/HDL Ratio  7. Vitamin D deficiency We will check labs today, we will follow-up at patient's next visit.  - VITAMIN D 25 Hydroxy (Vit-D Deficiency, Fractures)  8. Depression screening Sarah Porter had a positive depression screening. Depression is commonly associated with obesity and often results in emotional eating behaviors. We will monitor this closely and work on CBT to help improve the non-hunger eating patterns. Referral to Psychology may be required if no improvement is seen as she continues in our clinic.  9. Morbid (severe) obesity due to excess calories (HCC)  10. BMI 40.0-44.9, adult [facility]) Sarah Porter is currently in the action stage of change and her goal is to continue with weight loss efforts. I recommend Sarah Porter begin the structured treatment plan as follows:  She has agreed to the Category 2 Plan.  Meal planning was discussed.  Exercise goals: Exercise with a personal trainer 6 days/week.  Behavioral modification strategies: increasing lean protein intake, decreasing simple carbohydrates, increasing vegetables, increasing water intake, decreasing eating out, no skipping meals, meal planning and cooking strategies, keeping healthy foods in the home, and planning for success.  She was informed of the importance of frequent follow-up visits to maximize her success with intensive lifestyle modifications for her multiple health conditions. She was informed we would discuss her lab results at her next visit unless there is a critical issue that needs to be addressed sooner. Sarah Porter agreed to keep her next visit at the agreed upon time to discuss these results.  Objective:   Blood pressure  114/71, pulse (!) 56, temperature 97.7 F (36.5 C), height 5' 1.5" (1.562 m), weight 215 lb (97.5 kg), SpO2 99 %. Body mass index is 39.97 kg/m.  EKG: Normal sinus rhythm, rate 59 BPM.  Indirect Calorimeter completed today shows a VO2 of 222 and a REE of 1526.  Her calculated basal metabolic rate is 1610 thus her basal metabolic rate is worse than expected.  General: Cooperative, alert, well developed, in no acute distress. HEENT: Conjunctivae and lids unremarkable. Cardiovascular: Regular rhythm.  Lungs: Normal work of breathing. Neurologic: No focal deficits.   Lab Results  Component Value Date   CREATININE 0.75 02/17/2023   BUN 15 02/17/2023   NA 139 02/17/2023   K 4.8 02/17/2023   CL 101 02/17/2023   CO2 25 02/17/2023   Lab Results  Component Value Date   ALT 34 (H) 02/17/2023   AST 31 02/17/2023   ALKPHOS 98 02/17/2023   BILITOT 0.5 02/17/2023   Lab Results  Component Value Date   HGBA1C 6.0 (H) 02/17/2023   Lab Results  Component Value Date   INSULIN 8.1 02/17/2023   Lab Results  Component Value Date   TSH 1.030 02/17/2023   Lab Results  Component Value Date   CHOL 213 (H) 02/17/2023   HDL 83 02/17/2023   LDLCALC 112 (H) 02/17/2023   TRIG 104 02/17/2023   Lab Results  Component Value Date   WBC  6.1 02/17/2023   HGB 13.1 02/17/2023   HCT 40.3 02/17/2023   MCV 86 02/17/2023   PLT 339 02/17/2023   No results found for: "IRON", "TIBC", "FERRITIN"  Attestation Statements:   Reviewed by clinician on day of visit: allergies, medications, problem list, medical history, surgical history, family history, social history, and previous encounter notes.   Trude Mcburney, am acting as Energy manager for [employer], DO.  I have reviewed the above documentation for accuracy and completeness, and I agree with the above. Corinna Capra, DO

## 2023-02-26 DIAGNOSIS — F419 Anxiety disorder, unspecified: Secondary | ICD-10-CM | POA: Diagnosis not present

## 2023-02-26 DIAGNOSIS — F329 Major depressive disorder, single episode, unspecified: Secondary | ICD-10-CM | POA: Diagnosis not present

## 2023-03-01 ENCOUNTER — Encounter: Payer: Self-pay | Admitting: Neurology

## 2023-03-02 ENCOUNTER — Ambulatory Visit: Payer: 59 | Admitting: Bariatrics

## 2023-03-02 ENCOUNTER — Encounter: Payer: Self-pay | Admitting: Bariatrics

## 2023-03-02 VITALS — BP 110/70 | HR 61 | Temp 97.7°F | Ht 61.5 in | Wt 214.0 lb

## 2023-03-02 DIAGNOSIS — G44209 Tension-type headache, unspecified, not intractable: Secondary | ICD-10-CM | POA: Diagnosis not present

## 2023-03-02 DIAGNOSIS — E7849 Other hyperlipidemia: Secondary | ICD-10-CM | POA: Diagnosis not present

## 2023-03-02 DIAGNOSIS — G4709 Other insomnia: Secondary | ICD-10-CM

## 2023-03-02 DIAGNOSIS — Z6839 Body mass index (BMI) 39.0-39.9, adult: Secondary | ICD-10-CM | POA: Diagnosis not present

## 2023-03-02 DIAGNOSIS — E785 Hyperlipidemia, unspecified: Secondary | ICD-10-CM | POA: Diagnosis not present

## 2023-03-02 DIAGNOSIS — I10 Essential (primary) hypertension: Secondary | ICD-10-CM | POA: Diagnosis not present

## 2023-03-02 DIAGNOSIS — E119 Type 2 diabetes mellitus without complications: Secondary | ICD-10-CM | POA: Diagnosis not present

## 2023-03-02 DIAGNOSIS — F419 Anxiety disorder, unspecified: Secondary | ICD-10-CM | POA: Diagnosis not present

## 2023-03-02 DIAGNOSIS — E559 Vitamin D deficiency, unspecified: Secondary | ICD-10-CM | POA: Diagnosis not present

## 2023-03-02 DIAGNOSIS — M1990 Unspecified osteoarthritis, unspecified site: Secondary | ICD-10-CM | POA: Diagnosis not present

## 2023-03-02 DIAGNOSIS — Z713 Dietary counseling and surveillance: Secondary | ICD-10-CM | POA: Diagnosis not present

## 2023-03-02 DIAGNOSIS — R5382 Chronic fatigue, unspecified: Secondary | ICD-10-CM | POA: Diagnosis not present

## 2023-03-02 DIAGNOSIS — R7303 Prediabetes: Secondary | ICD-10-CM | POA: Diagnosis not present

## 2023-03-03 ENCOUNTER — Encounter: Payer: Self-pay | Admitting: Bariatrics

## 2023-03-03 NOTE — Progress Notes (Signed)
Chief Complaint:   OBESITY Sarah Porter is here to discuss her progress with her obesity treatment plan along with follow-up of her obesity related diagnoses. [place] is on the Category 2 Plan and states she is following her eating plan approximately 95% of the time. Teaja states she is doing 0 minutes 0 times per week.  Today's visit was #: 2 Starting weight: 215 lbs Starting date: 02/17/2023 Today's weight: 214 lbs Today's date: 03/02/2023 Total lbs lost to date: 1 Total lbs lost since last in-office visit: 1  Interim History: Patient is down 1 pound.  She gets up nightly and sometimes eat at night.  She is drinking Premier protein shakes for breakfast.  She still struggles with her water intake.  Subjective:   1. Pre-diabetes Patient is taking Victoza and metformin.  Her recent A1c was 6.0.  2. Other hyperlipidemia Patient is taking Lipitor.  Her recent total cholesterol was 213 and LDL 112.  3. Other insomnia Patient notes insomnia.  She is taking BuSpar 10 mg twice daily.  Assessment/Plan:   1. Pre-diabetes Patient will keep all carbohydrates low (sugar and starches), and she will continue her medications.  2. Other hyperlipidemia Patient will continue her medications as directed.  3. Other insomnia Strategies for sleep and applications for sleep were discussed.  [name]'s herbal, baking shows, reading, and breathing exercises 4 - 7 - 8.  4. Morbid (severe) obesity due to excess calories (HCC)  5. BMI 39.0-39.9,adult Sarah Porter is currently in the action stage of change. As such, her goal is to continue with weight loss efforts. She has agreed to the Category 2 Plan.   Meal planning was discussed.  Review labs with the patient from 02/17/2023, CMP, lipids, vitamin D, CBC, A1c, glucose, insulin, and thyroid panel.  Exercise goals: Patient will get back to the gym (water aerobics and weight room).  Behavioral modification strategies: increasing lean protein intake, decreasing  simple carbohydrates, increasing vegetables, increasing water intake, decreasing eating out, no skipping meals, meal planning and cooking strategies, keeping healthy foods in the home, and planning for success.  Braya has agreed to follow-up with our clinic in 2 weeks. She was informed of the importance of frequent follow-up visits to maximize her success with intensive lifestyle modifications for her multiple health conditions.   Objective:   Blood pressure 110/70, pulse 61, temperature 97.7 F (36.5 C), height 5' 1.5" (1.562 m), weight 214 lb (97.1 kg), SpO2 99 %. Body mass index is 39.78 kg/m.  General: Cooperative, alert, well developed, in no acute distress. HEENT: Conjunctivae and lids unremarkable. Cardiovascular: Regular rhythm.  Lungs: Normal work of breathing. Neurologic: No focal deficits.   Lab Results  Component Value Date   CREATININE 0.75 02/17/2023   BUN 15 02/17/2023   NA 139 02/17/2023   K 4.8 02/17/2023   CL 101 02/17/2023   CO2 25 02/17/2023   Lab Results  Component Value Date   ALT 34 (H) 02/17/2023   AST 31 02/17/2023   ALKPHOS 98 02/17/2023   BILITOT 0.5 02/17/2023   Lab Results  Component Value Date   HGBA1C 6.0 (H) 02/17/2023   Lab Results  Component Value Date   INSULIN 8.1 02/17/2023   Lab Results  Component Value Date   TSH 1.030 02/17/2023   Lab Results  Component Value Date   CHOL 213 (H) 02/17/2023   HDL 83 02/17/2023   LDLCALC 112 (H) 02/17/2023   TRIG 104 02/17/2023   Lab Results  Component Value  Date   VD25OH 44.0 02/17/2023   Lab Results  Component Value Date   WBC 6.1 02/17/2023   HGB 13.1 02/17/2023   HCT 40.3 02/17/2023   MCV 86 02/17/2023   PLT 339 02/17/2023   No results found for: "IRON", "TIBC", "FERRITIN"  Attestation Statements:   Reviewed by clinician on day of visit: allergies, medications, problem list, medical history, surgical history, family history, social history, and previous encounter  notes.   Trude Mcburney, am acting as Energy manager for [employer], DO.  I have reviewed the above documentation for accuracy and completeness, and I agree with the above. Corinna Capra, DO

## 2023-03-05 ENCOUNTER — Encounter (HOSPITAL_COMMUNITY): Payer: 59

## 2023-03-09 DIAGNOSIS — M1990 Unspecified osteoarthritis, unspecified site: Secondary | ICD-10-CM | POA: Diagnosis not present

## 2023-03-09 DIAGNOSIS — M797 Fibromyalgia: Secondary | ICD-10-CM | POA: Diagnosis not present

## 2023-03-09 DIAGNOSIS — M109 Gout, unspecified: Secondary | ICD-10-CM | POA: Diagnosis not present

## 2023-03-15 DIAGNOSIS — F332 Major depressive disorder, recurrent severe without psychotic features: Secondary | ICD-10-CM | POA: Diagnosis not present

## 2023-03-17 DIAGNOSIS — Z713 Dietary counseling and surveillance: Secondary | ICD-10-CM | POA: Diagnosis not present

## 2023-03-17 DIAGNOSIS — M797 Fibromyalgia: Secondary | ICD-10-CM | POA: Diagnosis not present

## 2023-03-17 DIAGNOSIS — M1990 Unspecified osteoarthritis, unspecified site: Secondary | ICD-10-CM | POA: Diagnosis not present

## 2023-03-17 DIAGNOSIS — Z7182 Exercise counseling: Secondary | ICD-10-CM | POA: Diagnosis not present

## 2023-03-18 ENCOUNTER — Encounter: Payer: Self-pay | Admitting: Bariatrics

## 2023-03-18 ENCOUNTER — Ambulatory Visit (INDEPENDENT_AMBULATORY_CARE_PROVIDER_SITE_OTHER): Payer: 59 | Admitting: Bariatrics

## 2023-03-18 VITALS — BP 117/75 | HR 63 | Temp 97.6°F | Ht 61.5 in | Wt 216.0 lb

## 2023-03-18 DIAGNOSIS — Z6841 Body Mass Index (BMI) 40.0 and over, adult: Secondary | ICD-10-CM

## 2023-03-18 DIAGNOSIS — R7303 Prediabetes: Secondary | ICD-10-CM

## 2023-03-18 DIAGNOSIS — F509 Eating disorder, unspecified: Secondary | ICD-10-CM | POA: Diagnosis not present

## 2023-03-18 NOTE — Progress Notes (Signed)
WEIGHT SUMMARY AND BIOMETRICS  Weight Gained Since Last Visit: 2lb   Vitals Temp: 97.6 F (36.4 C) BP: 117/75 Pulse Rate: 63 SpO2: 99 %   Anthropometric Measurements Height: 5' 1.5" (1.562 m) Weight: 216 lb (98 kg) BMI (Calculated): 40.16 Weight at Last Visit: 214lb Weight Gained Since Last Visit: 2lb Starting Weight: 215lb Total Weight Loss (lbs): 0 lb (0 kg)   Body Composition  Body Fat %: 49.9 % Fat Mass (lbs): 108.2 lbs Muscle Mass (lbs): 103 lbs Total Body Water (lbs): 81.2 lbs Visceral Fat Rating : 16   Other Clinical Data Fasting: yes Labs: no Today's Visit #: 3 Starting Date: 02/17/23    OBESITY Sarah Porter is here to discuss her progress with her obesity treatment plan along with follow-up of her obesity related diagnoses.    Nutrition Plan: the Category 2 plan - 75% adherence.  Current exercise: none  Interim History:  She is up 2 lbs since her last visit.  Eating all of the food on the plan., Not journaling consistently., and Water intake is inadequate. She is having some nighttime cravings.   Pharmacotherapy: Sarah Porter is on Metformin 500 mg once daily breakfast and Victoza 18 mg daily.  Adverse side effects: None Hunger is moderately controlled.  Cravings are moderately controlled.  Assessment/Plan:   Eating disorder/emotional eating Sarah Porter has had issues with stress eating, emotional eating, and nighttime eating. Currently this is poorly controlled. Overall mood is stable. Denies suicidal/homicidal ideation. Medication(s): Buspar and Cymbalta.   Plan:  Patient was referred to Dr. Dewaine Conger, our Bariatric Psychologist, for evaluation due to her significant struggles with emotional eating. Discussed distractions to curb eating behaviors. Discussed activities to do with one's hands in the evening  Be sure to get adequate rest as lack of rest can trigger appetite.  Have plan in place for stressful events.  Consider other rewards besides  food.    Prediabetes Last A1c was 6.0  Medication(s): Victoza and Metformin   Lab Results  Component Value Date   HGBA1C 6.0 (H) 02/17/2023   Lab Results  Component Value Date   INSULIN 8.1 02/17/2023    Plan: Information sheet on " Insulin Resistance and Prediabetes".  Will minimize all refined carbohydrates both sweets and starches.  Will work on the plan and exercise.  Consider both aerobic and resistance training.  Will keep protein, water, and fiber intake high.  Increase Polyunsaturated and Monounsaturated fats to increase satiety and encourage weight loss.  Aim for 7 to 9 hours of sleep nightly.  Will continue medications.  Continue Victoza 1.8 mg SQ daily and Metformin 500 mg once daily breakfast    Morbid Obesity: Current BMI BMI (Calculated): 40.16   Pharmacotherapy Plan Continue  Victoza 1.8 mg SQ daily and Metformin 500 mg once daily breakfast  Sarah Porter is currently in the action stage of change. As such, her goal is to continue with weight loss efforts.  She has agreed to the Category 2 plan.  Exercise goals: All adults should avoid inactivity. Some physical activity is better than none, and adults who participate in any amount of physical activity gain some health benefits.  Behavioral modification strategies: decreasing simple carbohydrates , no meal skipping, meal planning , increase water intake, better snacking choices, emotional eating strategies, get rid of junk food in the home, and decrease snacking .  Sarah Porter has agreed to follow-up with our clinic in 2 weeks.      Objective:   VITALS: Per patient if applicable, see vitals.  GENERAL: Alert and in no acute distress. CARDIOPULMONARY: No increased WOB. Speaking in clear sentences.  PSYCH: Pleasant and cooperative. Speech normal rate and rhythm. Affect is appropriate. Insight and judgement are appropriate. Attention is focused, linear, and appropriate.  NEURO: Oriented as arrived to appointment on time  with no prompting.   Attestation Statements:    This was prepared with the assistance of Engineer, civil (consulting).  Occasional wrong-word or sound-a-like substitutions may have occurred due to the inherent limitations of voice recognition software.   Sarah Capra, DO

## 2023-03-19 ENCOUNTER — Telehealth: Payer: Self-pay | Admitting: Physical Medicine and Rehabilitation

## 2023-03-19 NOTE — Telephone Encounter (Signed)
Patient called. She would like an appointment with Dr. Newton.  

## 2023-03-22 ENCOUNTER — Encounter (HOSPITAL_COMMUNITY): Payer: Self-pay

## 2023-03-22 NOTE — Therapy (Signed)
[facility] at [facility] [address] [place], [place], [address] Phone: [phone]   Fax:  [phone]  Patient Details  Name: Sarah Porter MRN: [id number] Date of Birth: [birth year] Referring Provider:  No ref. provider found  Encounter Date: 03/22/2023  PHYSICAL THERAPY DISCHARGE SUMMARY  Visits from Start of Care: 2  Current functional level related to goals / functional outcomes: Evaluation only.    Remaining deficits: NA   Education / Equipment: NA   Patient agrees to discharge. Patient goals were not met. Patient is being discharged due to the patient's request.   Nelida Meuse, PT 03/22/2023, 1:20 PM  [facility] at [facility] [address] [place], [place], [address] Phone: [phone]   Fax:  [phone]

## 2023-03-23 ENCOUNTER — Ambulatory Visit: Payer: 59 | Admitting: Neurology

## 2023-03-24 ENCOUNTER — Ambulatory Visit: Payer: 59 | Admitting: Orthopaedic Surgery

## 2023-03-24 ENCOUNTER — Encounter: Payer: Self-pay | Admitting: Orthopaedic Surgery

## 2023-03-24 VITALS — Ht 61.5 in | Wt 216.0 lb

## 2023-03-24 DIAGNOSIS — G8929 Other chronic pain: Secondary | ICD-10-CM | POA: Diagnosis not present

## 2023-03-24 DIAGNOSIS — M5126 Other intervertebral disc displacement, lumbar region: Secondary | ICD-10-CM

## 2023-03-24 NOTE — Telephone Encounter (Signed)
Patient has a pending injection from Dr. Ophelia Charter

## 2023-03-24 NOTE — Progress Notes (Signed)
Office Visit Note   Patient: Sarah Porter           Date of Birth: Nov 25, 1960           MRN: [id number] Visit Date: 03/24/2023              Requested by: Waldon Reining, MD [address] [place],  [place] [address] PCP: Waldon Reining, MD   Assessment & Plan: Visit Diagnoses:  1. Chronic bilateral low back pain, unspecified whether sciatica present   2. Herniated nucleus pulposus, L4-5 right     Plan: We discussed taking activity modification trying to avoid repetitive turning twisting activities.  She develops progressive radicular symptoms she can return.  Otherwise recheck 3 months.  Follow-Up Instructions: Return in about 3 months (around 06/24/2023).   Orders:  Orders Placed This Encounter  Procedures   Ambulatory referral to Physical Medicine Rehab   No orders of the defined types were placed in this encounter.     Procedures: No procedures performed   Clinical Data: No additional findings.   Subjective: Chief Complaint  Patient presents with   Right Knee - Pain   Left Knee - Pain   Lower Back - Pain    HPI 62 year old female seen with back pain.  MRI scan 05/21/2020 showed some neuroforaminal narrowing at L3-4 and L4-5 and right L4-5 disc protrusion.  Patient denies bowel or bladder associated symptoms.  Past problems with tennis elbow doing better.  Patient's been exercising and gradually working on weight loss.  Review of Systems 14 point system update unchanged from 12/17/2022.   Objective: Vital Signs: Ht 5' 1.5" (1.562 m)   Wt 216 lb (98 kg)   BMI 40.15 kg/m   Physical Exam Constitutional:      Appearance: She is well-developed.  HENT:     Head: Normocephalic.     Right Ear: External ear normal.     Left Ear: External ear normal. There is no impacted cerumen.  Eyes:     Pupils: Pupils are equal, round, and reactive to light.  Neck:     Thyroid: No thyromegaly.     Trachea: No tracheal deviation.  Cardiovascular:     Rate and  Rhythm: Normal rate.  Pulmonary:     Effort: Pulmonary effort is normal.  Abdominal:     Palpations: Abdomen is soft.  Musculoskeletal:     Cervical back: No rigidity.  Skin:    General: Skin is warm and dry.  Neurological:     Mental Status: She is alert and oriented to person, place, and time.  Psychiatric:        Behavior: Behavior normal.     Ortho Exam good elbow range of motion full extension full flexion.  Negative straight leg raising 90 degrees knee and ankle jerk are intact patient is able to heel and toe walk.  Specialty Comments:  MRI LUMBAR SPINE WITHOUT CONTRAST    TECHNIQUE:  Multiplanar, multisequence MR imaging of the lumbar spine was  performed. No intravenous contrast was administered.    COMPARISON:  None.    FINDINGS:  Segmentation: Standard.    Alignment:  Normal.    Vertebrae: Normal bone marrow signal intensity. Scattered T1/T2  hyperintense foci may reflect hemangiomata versus focal fat.    Conus medullaris and cauda equina: Conus extends to the L2 level.  Conus and cauda equina appear normal.    Disc levels: Multilevel desiccation and mild disc space loss.    L1-2: No significant  disc bulge, spinal canal or neural foraminal  narrowing.    L2-3: Mild disc bulge. Left extraforaminal annular fissuring.  Bilateral facet degenerative spurring. No significant spinal canal  or neural foraminal narrowing.    L3-4: Disc bulge, mild ligamentum flavum and bilateral facet  hypertrophy. Mild bilateral neural foraminal narrowing. Patent  spinal canal.    L4-5: Disc bulge with superimposed right foraminal protrusion  grazing the exiting right L4 nerve root. Bilateral facet  hypertrophy. Patent spinal canal and left neural foramen. Mild right  neural foraminal narrowing.    L5-S1: No significant disc bulge, spinal canal or neural foraminal  narrowing.    Paraspinal and other soft tissues: Negative.    IMPRESSION:  Mild bilateral L3-4 and right  L4-5 neural foraminal narrowing.    No significant spinal canal narrowing.    Right L4-5 foraminal protrusion grazing the exiting right L4 nerve  root.      Electronically Signed    By: Stana Bunting M.D.    On: 05/21/2020 10:19  Imaging: Narrative & Impression  CLINICAL DATA:  Low back pain   EXAM: MRI LUMBAR SPINE WITHOUT CONTRAST   TECHNIQUE: Multiplanar, multisequence MR imaging of the lumbar spine was performed. No intravenous contrast was administered.   COMPARISON:  None.   FINDINGS: Segmentation:  Standard.   Alignment:  Normal.   Vertebrae: Normal bone marrow signal intensity. Scattered T1/T2 hyperintense foci may reflect hemangiomata versus focal fat.   Conus medullaris and cauda equina: Conus extends to the L2 level. Conus and cauda equina appear normal.   Disc levels: Multilevel desiccation and mild disc space loss.   L1-2: No significant disc bulge, spinal canal or neural foraminal narrowing.   L2-3: Mild disc bulge. Left extraforaminal annular fissuring. Bilateral facet degenerative spurring. No significant spinal canal or neural foraminal narrowing.   L3-4: Disc bulge, mild ligamentum flavum and bilateral facet hypertrophy. Mild bilateral neural foraminal narrowing. Patent spinal canal.   L4-5: Disc bulge with superimposed right foraminal protrusion grazing the exiting right L4 nerve root. Bilateral facet hypertrophy. Patent spinal canal and left neural foramen. Mild right neural foraminal narrowing.   L5-S1: No significant disc bulge, spinal canal or neural foraminal narrowing.   Paraspinal and other soft tissues: Negative.   IMPRESSION: Mild bilateral L3-4 and right L4-5 neural foraminal narrowing.   No significant spinal canal narrowing.   Right L4-5 foraminal protrusion grazing the exiting right L4 nerve root.     Electronically Signed   By: Stana Bunting M.D.   On: 05/21/2020 10:19     PMFS History: Patient  Active Problem List   Diagnosis Date Noted   Other insomnia 03/02/2023   BMI 40.0-44.9, adult (HCC) 02/17/2023   Vitamin D deficiency 02/17/2023   Other hyperlipidemia 02/17/2023   Health care maintenance 02/17/2023   Fibromyalgia 02/17/2023   Pre-diabetes 02/17/2023   Other fatigue 02/17/2023   SOB (shortness of breath) on exertion 02/17/2023   Left tennis elbow 12/17/2022   H/O adenomatous polyp of colon 06/25/2021   Tibialis posterior tendinitis, right 05/22/2020   Herniated nucleus pulposus, L4-5 right 05/22/2020   Arthritis of right ankle 05/22/2020   Pain in right ankle and joints of right foot 05/08/2020   Low back pain 05/08/2020   Morbid (severe) obesity due to excess calories (HCC) 05/08/2020   Past Medical History:  Diagnosis Date   Anemia    Anxiety    Back pain    Chest pain    Constipation  Depression    Essential hypertension    Fibromyalgia    GERD (gastroesophageal reflux disease)    Hyperlipidemia    Joint pain    Osteoarthritis    Palpitations    Sleep apnea    SOB (shortness of breath)    Swelling of lower extremity    Type 2 diabetes mellitus (HCC)    Vertigo     Family History  Problem Relation Age of Onset   AAA (abdominal aortic aneurysm) Mother    Sudden death Mother    Alcoholism Mother    Dementia Father    Asthma Sister    Kidney cancer Sister        had kidney removed   Diabetes Sister    Lymphoma Nephew    Colon cancer Neg Hx     Past Surgical History:  Procedure Laterality Date   BILATERAL KNEE REPAIRS     CESAREAN SECTION     x 2   COLONOSCOPY  11/04/2011   Dr. Edrick Kins; diminutive colon polyp in the rectum, otherwise normal exam.  Pathology with tubular adenoma.  Recommended 5-year repeat.   COLONOSCOPY  07/12/2014   Dr. Edrick Kins; submucosal hemorrhages in proximal descending colon?  Prior ischemic or infectious colitis?,  Otherwise normal exam.  Recommended 5-year repeat.   COLONOSCOPY WITH PROPOFOL N/A  07/22/2021   Procedure: COLONOSCOPY WITH PROPOFOL;  Surgeon: Lanelle Bal, DO;  Location: [facility];  Service: Endoscopy;  Laterality: N/A;  8:45am   ESOPHAGOGASTRODUODENOSCOPY  07/21/2004   Dr. Edrick Kins; grade 2 erosive esophagitis at EG junction, cervical inlet patch, otherwise normal exam.   Social History   Occupational History   Not on file  Tobacco Use   Smoking status: Former    Current packs/day: 0.00    Types: Cigarettes    Quit date: 09/01/1999    Years since quitting: 23.5    Passive exposure: Never   Smokeless tobacco: Never   Tobacco comments:    smoked for approx 20 years   Vaping Use   Vaping status: Never Used  Substance and Sexual Activity   Alcohol use: Not Currently   Drug use: Yes    Types: Marijuana    Comment: occasionally-cbd   Sexual activity: Yes    Birth control/protection: Post-menopausal

## 2023-03-26 DIAGNOSIS — F419 Anxiety disorder, unspecified: Secondary | ICD-10-CM | POA: Diagnosis not present

## 2023-03-26 DIAGNOSIS — F329 Major depressive disorder, single episode, unspecified: Secondary | ICD-10-CM | POA: Diagnosis not present

## 2023-04-21 ENCOUNTER — Encounter: Payer: 59 | Admitting: Internal Medicine

## 2023-04-26 ENCOUNTER — Telehealth (INDEPENDENT_AMBULATORY_CARE_PROVIDER_SITE_OTHER): Payer: 59 | Admitting: Psychology

## 2023-04-29 ENCOUNTER — Ambulatory Visit: Payer: 59 | Admitting: Bariatrics

## 2023-04-29 ENCOUNTER — Encounter: Payer: Self-pay | Admitting: Bariatrics

## 2023-04-29 VITALS — BP 130/74 | HR 62 | Temp 97.5°F | Ht 61.5 in | Wt 216.0 lb

## 2023-04-29 DIAGNOSIS — R7303 Prediabetes: Secondary | ICD-10-CM

## 2023-04-29 DIAGNOSIS — Z6841 Body Mass Index (BMI) 40.0 and over, adult: Secondary | ICD-10-CM

## 2023-04-29 DIAGNOSIS — R632 Polyphagia: Secondary | ICD-10-CM | POA: Diagnosis not present

## 2023-04-29 NOTE — Progress Notes (Signed)
WEIGHT SUMMARY AND BIOMETRICS  Weight Lost Since Last Visit: 0  Weight Gained Since Last Visit: 0   Vitals Temp: (!) 97.5 F (36.4 C) BP: 130/74 Pulse Rate: 62 SpO2: 95 %   Anthropometric Measurements Height: 5' 1.5" (1.562 m) Weight: 216 lb (98 kg) BMI (Calculated): 40.16 Weight at Last Visit: 216lb Weight Lost Since Last Visit: 0 Weight Gained Since Last Visit: 0 Starting Weight: 215lb Total Weight Loss (lbs): 0 lb (0 kg)   Body Composition  Body Fat %: 49.1 % Fat Mass (lbs): 106.2 lbs Muscle Mass (lbs): 104.6 lbs Total Body Water (lbs): 80.2 lbs Visceral Fat Rating : 16   Other Clinical Data Fasting: yes Labs: no Today's Visit #: 4 Starting Date: 02/17/23    OBESITY Sarah Porter is here to discuss her progress with her obesity treatment plan along with follow-up of her obesity related diagnoses.     Nutrition Plan: the Category 2 plan - 25% adherence.  Current exercise: walking  Interim History:  Her weight remains the same. She has been out of town.  Eating all of the food on the plan., Protein intake is as prescribed, and Water intake is adequate.  Pharmacotherapy: Sarah Porter is on Victoza 1.8 mg SQ daily and Metformin 500 mg once daily breakfast Adverse side effects: None Hunger is moderately controlled.  Cravings are moderately controlled.   Assessment/Plan:   Prediabetes Last A1c was 6.0  Medication(s):  Victoza 1.8 mg SQ daily and Metformin 500 mg once daily breakfast Lab Results  Component Value Date   HGBA1C 6.0 (H) 02/17/2023   Lab Results  Component Value Date   INSULIN 8.1 02/17/2023    Plan: Will resume her exercise and will begin water aerobics.  Will minimize all refined carbohydrates both sweets and starches.  She will get back on track. Will keep protein, water, and fiber intake high.  Aim for 7 to 9 hours of sleep nightly.  Will continue medications.  Given fiber sheet.  ContinueVictoza 1.8 mg SQ daily and  Metformin 500 mg twice daily with meals  Will consider a Valerian and Melatonin.   Polyphagia Sarah Porter endorses excessive hunger.  Medication(s): Victoza and Metformin Effects of medication:  moderately controlled. Cravings are moderately controlled.   Plan: Medication(s): Victoza 1.8 mg SQ daily and Metformin 500 mg once daily breakfast Will increase water, protein and fiber to help assuage hunger.  Will minimize foods that have a high glucose index/load to minimize reactive hypoglycemia.    Morbid Obesity: Current BMI BMI (Calculated): 40.16   Pharmacotherapy Plan Continue  Victoza 1.8 mg SQ daily and Metformin 500 mg once daily breakfast  Sarah Porter is currently in the action stage of change. As such, her goal is to continue with weight loss efforts.  She has agreed to the Category 2 plan.  Exercise goals: All adults should avoid inactivity. Some physical activity is better than none, and adults who participate in any amount of physical activity gain some health benefits.  Behavioral modification strategies: increasing lean protein intake, decreasing simple carbohydrates , no meal skipping, increase water intake, better snacking choices, planning for success, increasing vegetables, increasing fiber rich foods, ways to avoid night time snacking, avoiding temptations, and mindful eating.  Sarah Porter has agreed to follow-up with our clinic in 2 weeks.       Objective:   VITALS: Per patient if applicable, see vitals. GENERAL: Alert and in no acute distress. CARDIOPULMONARY: No increased WOB. Speaking in clear sentences.  PSYCH: Pleasant and cooperative. Speech  normal rate and rhythm. Affect is appropriate. Insight and judgement are appropriate. Attention is focused, linear, and appropriate.  NEURO: Oriented as arrived to appointment on time with no prompting.   Attestation Statements:   This was prepared with the assistance of Engineer, civil (consulting).  Occasional wrong-word or sound-a-like  substitutions may have occurred due to the inherent limitations of voice recognition software.   Corinna Capra, DO

## 2023-04-30 ENCOUNTER — Ambulatory Visit: Payer: 59 | Admitting: Cardiology

## 2023-04-30 ENCOUNTER — Other Ambulatory Visit (HOSPITAL_COMMUNITY): Payer: Self-pay | Admitting: Obstetrics & Gynecology

## 2023-04-30 DIAGNOSIS — Z1231 Encounter for screening mammogram for malignant neoplasm of breast: Secondary | ICD-10-CM

## 2023-05-06 ENCOUNTER — Other Ambulatory Visit: Payer: Self-pay

## 2023-05-06 ENCOUNTER — Ambulatory Visit: Payer: 59 | Admitting: Physical Medicine and Rehabilitation

## 2023-05-06 VITALS — BP 125/73 | HR 75

## 2023-05-06 DIAGNOSIS — M5416 Radiculopathy, lumbar region: Secondary | ICD-10-CM

## 2023-05-06 MED ORDER — METHYLPREDNISOLONE ACETATE 80 MG/ML IJ SUSP
80.0000 mg | Freq: Once | INTRAMUSCULAR | Status: AC
  Administered 2023-05-06: 80 mg

## 2023-05-06 NOTE — Progress Notes (Signed)
Functional Pain Scale - descriptive words and definitions  Distracting (5)    Aware of pain/able to complete some ADL's but limited by pain/sleep is affected and active distractions are only slightly useful. Moderate range order  Average Pain 7   +Driver, -BT, -Dye Allergies.  Lower back pain on left side that radiates into the left leg to the ankle

## 2023-05-06 NOTE — Patient Instructions (Signed)

## 2023-05-07 DIAGNOSIS — F419 Anxiety disorder, unspecified: Secondary | ICD-10-CM | POA: Diagnosis not present

## 2023-05-07 DIAGNOSIS — F329 Major depressive disorder, single episode, unspecified: Secondary | ICD-10-CM | POA: Diagnosis not present

## 2023-05-10 ENCOUNTER — Ambulatory Visit (HOSPITAL_COMMUNITY)
Admission: RE | Admit: 2023-05-10 | Discharge: 2023-05-10 | Disposition: A | Discharge status: Home / Self Care | Payer: 59 | Source: Ambulatory Visit | Attending: Obstetrics & Gynecology | Admitting: Obstetrics & Gynecology

## 2023-05-10 DIAGNOSIS — Z1231 Encounter for screening mammogram for malignant neoplasm of breast: Secondary | ICD-10-CM | POA: Diagnosis not present

## 2023-05-12 NOTE — Progress Notes (Signed)
Sarah Porter - 62 y.o. female MRN [id number]  Date of birth: August 25, 1961  Office Visit Note: Visit Date: 05/06/2023 PCP: Waldon Reining, MD Referred by: Waldon Reining, MD  Subjective: Chief Complaint  Patient presents with   Lower Back - Pain   HPI:  Sarah Porter is a 62 y.o. female who comes in today at the request of Dr. Annell Greening for planned Left L4-5 Lumbar Interlaminar epidural steroid injection with fluoroscopic guidance.  The patient has failed conservative care including home exercise, medications, time and activity modification.  This injection will be diagnostic and hopefully therapeutic.  Please see requesting physician notes for further details and justification.   ROS Otherwise per HPI.  Assessment & Plan: Visit Diagnoses:    ICD-10-CM   1. Lumbar radiculopathy  M54.16 XR C-ARM NO REPORT    Epidural Steroid injection    methylPREDNISolone acetate (DEPO-MEDROL) injection 80 mg      Plan: No additional findings.   Meds & Orders:  Meds ordered this encounter  Medications   methylPREDNISolone acetate (DEPO-MEDROL) injection 80 mg    Orders Placed This Encounter  Procedures   XR C-ARM NO REPORT   Epidural Steroid injection    Follow-up: No follow-ups on file.   Procedures: No procedures performed  Lumbar Epidural Steroid Injection - Interlaminar Approach with Fluoroscopic Guidance  Patient: Sarah Porter      Date of Birth: [birth year] MRN: [id number] PCP: Waldon Reining, MD      Visit Date: 05/06/2023   Universal Protocol:     Consent Given By: the patient  Position: PRONE  Additional Comments: Vital signs were monitored before and after the procedure. Patient was prepped and draped in the usual sterile fashion. The correct patient, procedure, and site was verified.   Injection Procedure Details:   Procedure diagnoses: Lumbar radiculopathy [M54.16]   Meds Administered:  Meds ordered this encounter  Medications   methylPREDNISolone  acetate (DEPO-MEDROL) injection 80 mg     Laterality: Left  Location/Site:  L4-5  Needle: 3.5 in., 20 ga. Tuohy  Needle Placement: Paramedian epidural  Findings:   -Comments: Excellent flow of contrast into the epidural space.  Procedure Details: Using a paramedian approach from the side mentioned above, the region overlying the inferior lamina was localized under fluoroscopic visualization and the soft tissues overlying this structure were infiltrated with 4 ml. of 1% Lidocaine without Epinephrine. The Tuohy needle was inserted into the epidural space using a paramedian approach.   The epidural space was localized using loss of resistance along with counter oblique bi-planar fluoroscopic views.  After negative aspirate for air, blood, and CSF, a 2 ml. volume of Isovue-250 was injected into the epidural space and the flow of contrast was observed. Radiographs were obtained for documentation purposes.    The injectate was administered into the level noted above.   Additional Comments:  No complications occurred Dressing: 2 x 2 sterile gauze and Band-Aid    Post-procedure details: Patient was observed during the procedure. Post-procedure instructions were reviewed.  Patient left the clinic in stable condition.   Clinical History: MRI LUMBAR SPINE WITHOUT CONTRAST    TECHNIQUE:  Multiplanar, multisequence MR imaging of the lumbar spine was  performed. No intravenous contrast was administered.    COMPARISON:  None.    FINDINGS:  Segmentation: Standard.    Alignment:  Normal.    Vertebrae: Normal bone marrow signal intensity. Scattered T1/T2  hyperintense foci may reflect hemangiomata versus focal fat.    Conus medullaris and  cauda equina: Conus extends to the L2 level.  Conus and cauda equina appear normal.    Disc levels: Multilevel desiccation and mild disc space loss.    L1-2: No significant disc bulge, spinal canal or neural foraminal  narrowing.    L2-3:  Mild disc bulge. Left extraforaminal annular fissuring.  Bilateral facet degenerative spurring. No significant spinal canal  or neural foraminal narrowing.    L3-4: Disc bulge, mild ligamentum flavum and bilateral facet  hypertrophy. Mild bilateral neural foraminal narrowing. Patent  spinal canal.    L4-5: Disc bulge with superimposed right foraminal protrusion  grazing the exiting right L4 nerve root. Bilateral facet  hypertrophy. Patent spinal canal and left neural foramen. Mild right  neural foraminal narrowing.    L5-S1: No significant disc bulge, spinal canal or neural foraminal  narrowing.    Paraspinal and other soft tissues: Negative.    IMPRESSION:  Mild bilateral L3-4 and right L4-5 neural foraminal narrowing.    No significant spinal canal narrowing.    Right L4-5 foraminal protrusion grazing the exiting right L4 nerve  root.      Electronically Signed    By: Stana Bunting M.D.    On: 05/21/2020 10:19     Objective:  VS:  HT:    WT:   BMI:     BP:125/73  HR:75bpm  TEMP: ( )  RESP:  Physical Exam Vitals and nursing note reviewed.  Constitutional:      General: She is not in acute distress.    Appearance: Normal appearance. She is not ill-appearing.  HENT:     Head: Normocephalic and atraumatic.     Right Ear: External ear normal.     Left Ear: External ear normal.  Eyes:     Extraocular Movements: Extraocular movements intact.  Cardiovascular:     Rate and Rhythm: Normal rate.     Pulses: Normal pulses.  Pulmonary:     Effort: Pulmonary effort is normal. No respiratory distress.  Abdominal:     General: There is no distension.     Palpations: Abdomen is soft.  Musculoskeletal:        General: Tenderness present.     Cervical back: Neck supple.     Right lower leg: No edema.     Left lower leg: No edema.     Comments: Patient has good distal strength with no pain over the greater trochanters.  No clonus or focal weakness.  Skin:     Findings: No erythema, lesion or rash.  Neurological:     General: No focal deficit present.     Mental Status: She is alert and oriented to person, place, and time.     Sensory: No sensory deficit.     Motor: No weakness or abnormal muscle tone.     Coordination: Coordination normal.  Psychiatric:        Mood and Affect: Mood normal.        Behavior: Behavior normal.      Imaging: No results found.

## 2023-05-12 NOTE — Procedures (Signed)
Lumbar Epidural Steroid Injection - Interlaminar Approach with Fluoroscopic Guidance  Patient: Sarah Porter      Date of Birth: 05-12-1961 MRN: [id number] PCP: Waldon Reining, MD      Visit Date: 05/06/2023   Universal Protocol:     Consent Given By: the patient  Position: PRONE  Additional Comments: Vital signs were monitored before and after the procedure. Patient was prepped and draped in the usual sterile fashion. The correct patient, procedure, and site was verified.   Injection Procedure Details:   Procedure diagnoses: Lumbar radiculopathy [M54.16]   Meds Administered:  Meds ordered this encounter  Medications   methylPREDNISolone acetate (DEPO-MEDROL) injection 80 mg     Laterality: Left  Location/Site:  L4-5  Needle: 3.5 in., 20 ga. Tuohy  Needle Placement: Paramedian epidural  Findings:   -Comments: Excellent flow of contrast into the epidural space.  Procedure Details: Using a paramedian approach from the side mentioned above, the region overlying the inferior lamina was localized under fluoroscopic visualization and the soft tissues overlying this structure were infiltrated with 4 ml. of 1% Lidocaine without Epinephrine. The Tuohy needle was inserted into the epidural space using a paramedian approach.   The epidural space was localized using loss of resistance along with counter oblique bi-planar fluoroscopic views.  After negative aspirate for air, blood, and CSF, a 2 ml. volume of Isovue-250 was injected into the epidural space and the flow of contrast was observed. Radiographs were obtained for documentation purposes.    The injectate was administered into the level noted above.   Additional Comments:  No complications occurred Dressing: 2 x 2 sterile gauze and Band-Aid    Post-procedure details: Patient was observed during the procedure. Post-procedure instructions were reviewed.  Patient left the clinic in stable condition.

## 2023-05-20 ENCOUNTER — Encounter: Payer: Self-pay | Admitting: Bariatrics

## 2023-05-20 ENCOUNTER — Ambulatory Visit: Payer: 59 | Admitting: Bariatrics

## 2023-05-20 VITALS — BP 129/78 | HR 67 | Temp 97.6°F | Ht 61.5 in | Wt 221.0 lb

## 2023-05-20 DIAGNOSIS — R7303 Prediabetes: Secondary | ICD-10-CM | POA: Diagnosis not present

## 2023-05-20 DIAGNOSIS — Z6841 Body Mass Index (BMI) 40.0 and over, adult: Secondary | ICD-10-CM | POA: Diagnosis not present

## 2023-05-20 DIAGNOSIS — R632 Polyphagia: Secondary | ICD-10-CM

## 2023-05-20 MED ORDER — METFORMIN HCL 500 MG PO TABS: 500.0000 mg | ORAL_TABLET | Freq: Two times a day (BID) | ORAL | 0 refills | Status: DC

## 2023-05-20 MED ORDER — SEMAGLUTIDE-WEIGHT MANAGEMENT 0.25 MG/0.5ML ~~LOC~~ SOAJ: 0.2500 mg | Freq: Once | SUBCUTANEOUS | Status: DC

## 2023-05-20 NOTE — Progress Notes (Signed)
WEIGHT SUMMARY AND BIOMETRICS  Weight Gained Since Last Visit: 5lb   Vitals Temp: 97.6 F (36.4 C) BP: 129/78 Pulse Rate: 67 SpO2: 99 %   Anthropometric Measurements Height: 5' 1.5" (1.562 m) Weight: 221 lb (100.2 kg) BMI (Calculated): 41.09 Weight at Last Visit: 216lb Weight Gained Since Last Visit: 5lb Starting Weight: 215lb Total Weight Loss (lbs): 0 lb (0 kg)   Body Composition  Body Fat %: 51 % Fat Mass (lbs): 113.2 lbs Muscle Mass (lbs): 103 lbs Total Body Water (lbs): 83.8 lbs Visceral Fat Rating : 17   Other Clinical Data Fasting: no Labs: no Today's Visit #: 5 Starting Date: 02/17/23    OBESITY Adwita is here to discuss her progress with her obesity treatment plan along with follow-up of her obesity related diagnoses.     Nutrition Plan: the Category 2 plan - 50% adherence.  Current exercise: none  Interim History:  She is up 5 lbs since her last visit. She is having more headaches. She increased her Lyrica Eating all of the food on the plan., Protein intake is as prescribed, Is exceeding snack calorie allotment, Water intake is inadequate., and Reports polyphagia  Pharmacotherapy: Jahkayla is on Victoza 1.8 mg SQ daily ( will stop ).  Adverse side effects: None Hunger is moderately controlled.  Cravings are moderately controlled.  Assessment/Plan:   Aysia Bertone endorses excessive hunger.  She has tried Trulicity, Metformin, and Victoza  Medication(s): Victoza and Metformin Effects of medication:  moderately controlled. Cravings are moderately controlled.   Plan: Medication(s): Wegovy 0.25 mg SQ weekly and Metformin 500 mg twice daily with meals. ( Stopped Victoza ).  Will increase water, protein and fiber to help assuage hunger.  Will minimize foods that have a high glucose index/load to minimize reactive hypoglycemia.   Prediabetes Last A1c was 6.0  Medication(s):  Victoza 1.8 mg SQ daily and Metformin 500 mg once daily  breakfast Lab Results  Component Value Date   HGBA1C 6.0 (H) 02/17/2023   Lab Results  Component Value Date   INSULIN 8.1 02/17/2023    Plan: Will minimize all refined carbohydrates both sweets and starches.  Will work on the plan and exercise.  Consider both aerobic and resistance training.  Will keep protein, water, and fiber intake high.  Increase Polyunsaturated and Monounsaturated fats to increase satiety and encourage weight loss.  Aim for 7 to 9 hours of sleep nightly.  Will continue medications.   Start Wegovy 0.25 mg SQ weekly     Morbid Obesity: Current BMI BMI (Calculated): 41.09   Pharmacotherapy Plan Start  Wegovy 0.25 mg SQ weekly, will increase her Metformin to Bid.   Lilianna is currently in the action stage of change. As such, her goal is to continue with weight loss efforts.  She has agreed to the Category 2 plan.  Exercise goals: All adults should avoid inactivity. Some physical activity is better than none, and adults who participate in any amount of physical activity gain some health benefits.  Behavioral modification strategies: increasing lean protein intake, increase water intake, emotional eating strategies, get rid of junk food in the home, and avoiding temptations.  Rameka has agreed to follow-up with our clinic in 4 weeks.     Objective:   VITALS: Per patient if applicable, see vitals. GENERAL: Alert and in no acute distress. CARDIOPULMONARY: No increased WOB. Speaking in clear sentences.  PSYCH: Pleasant and cooperative. Speech normal rate and rhythm. Affect is appropriate. Insight and judgement are appropriate.  Attention is focused, linear, and appropriate.  NEURO: Oriented as arrived to appointment on time with no prompting.   Attestation Statements:   This was prepared with the assistance of Engineer, civil (consulting).  Occasional wrong-word or sound-a-like substitutions may have occurred due to the inherent limitations of voice recognition  software. Corinna Capra, DO

## 2023-05-24 ENCOUNTER — Telehealth (INDEPENDENT_AMBULATORY_CARE_PROVIDER_SITE_OTHER): Payer: 59 | Admitting: Psychology

## 2023-05-24 DIAGNOSIS — F419 Anxiety disorder, unspecified: Secondary | ICD-10-CM | POA: Diagnosis not present

## 2023-05-24 DIAGNOSIS — F32A Depression, unspecified: Secondary | ICD-10-CM

## 2023-05-24 DIAGNOSIS — F5089 Other specified eating disorder: Secondary | ICD-10-CM | POA: Diagnosis not present

## 2023-05-24 DIAGNOSIS — F439 Reaction to severe stress, unspecified: Secondary | ICD-10-CM | POA: Diagnosis not present
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
## 2023-05-25 ENCOUNTER — Encounter: Payer: Self-pay | Admitting: Nurse Practitioner

## 2023-05-25 ENCOUNTER — Ambulatory Visit: Payer: 59 | Attending: Nurse Practitioner | Admitting: Nurse Practitioner

## 2023-05-25 ENCOUNTER — Telehealth (INDEPENDENT_AMBULATORY_CARE_PROVIDER_SITE_OTHER): Payer: Self-pay | Admitting: Bariatrics

## 2023-05-25 VITALS — BP 122/80 | HR 72 | Ht 61.0 in | Wt 225.4 lb

## 2023-05-25 DIAGNOSIS — E785 Hyperlipidemia, unspecified: Secondary | ICD-10-CM

## 2023-05-25 DIAGNOSIS — E119 Type 2 diabetes mellitus without complications: Secondary | ICD-10-CM

## 2023-05-25 DIAGNOSIS — I10 Essential (primary) hypertension: Secondary | ICD-10-CM | POA: Diagnosis not present

## 2023-05-25 DIAGNOSIS — R0602 Shortness of breath: Secondary | ICD-10-CM | POA: Diagnosis not present

## 2023-05-25 DIAGNOSIS — K219 Gastro-esophageal reflux disease without esophagitis: Secondary | ICD-10-CM

## 2023-05-25 DIAGNOSIS — R0789 Other chest pain: Secondary | ICD-10-CM | POA: Diagnosis not present

## 2023-05-25 NOTE — Telephone Encounter (Signed)
Dr. Manson Passey, I received a phone call today from patient Sarah Porter stating she will no longer be coming, states it has nothing to do with the clinic, she loves everybody there. She states her money is tight and she needs to start back in counseling and that is where she feels she needs to spend her money.   Thank you

## 2023-05-25 NOTE — Progress Notes (Unsigned)
Referring Provider: Sharlene Dory, NP  Primary Care Physician:  Waldon Reining, MD Primary GI Physician: Dr. Marletta Lor  Chief Complaint  Patient presents with   Gastroesophageal Reflux    Deep chest pain that go around to her back and burning in her chest. Lots of stomach aches.     HPI:   Sarah Porter is a 62 y.o. female presenting today at the request of Sharlene Dory, NP  for GERD.   Reviewed office visit with Sharlene Dory, NP 05/25/2023.  Noted patient had history of atypical chest pain which had been described as a pressure/indigestion sensation in the central sternal area.  Prior Myoview was low risk.  At the time of her office visit, she reported random episodes of mid chest pain that radiated to her right jaw that had occurred a couple of times since her last visit in May.  Also with sensation of foods getting caught after swallowing.  Recommended GI evaluation.  Today:  Intermittent extreme substernal chest pain that will radiate into her jaw and back. Occurs at random and is short lived.  No specific triggers.  Not exertional.    Also with heartburn daily. Takes omeprazole 20 mg OTC which helps some. She has been taking this for about 6 months. Sometimes waking up in the middle of the night feeling like it is hard to breathe due to severe reflux.  Also feeling like items will get hung in her esophagus at times.  Occurs with solid foods and if taking more than 1 pill at a time.  Intermittent stomach aches in the mid upper abdomen.    Has chronic history of GERD, but was not on a regular basis prior to about 6 months ago.  Has cut way back on coffee. Less than 1 cup a day. Avoiding spicy foods, fatty foods, and tomato based products.  Some nausea but no vomiting.   Has noticed some black stools. Taking pepto bismol a couple times a week for her upper GI symptoms.    NSAIDs:  Aspirin 81 mg daily.  Meloxicam a couple times a week for arthritis. Started this a few  months ago.  Does not feel that it helps much.  EGD years ago in [place]. Not sure of the results.   Colonoscopy 07/22/2021: Nonbleeding internal hemorrhoids.  Otherwise normal exam.  Recommended screening in 10 years.   Hasn't started Ozempic yet.    Past Medical History:  Diagnosis Date   Anemia    Anxiety    Back pain    Chest pain    Constipation    Depression    Essential hypertension    Fibromyalgia    GERD (gastroesophageal reflux disease)    Hyperlipidemia    Joint pain    Osteoarthritis    Palpitations    Sleep apnea    SOB (shortness of breath)    Swelling of lower extremity    Type 2 diabetes mellitus (HCC)    Vertigo     Past Surgical History:  Procedure Laterality Date   BILATERAL KNEE REPAIRS     CESAREAN SECTION     x 2   COLONOSCOPY  11/04/2011   Dr. Edrick Kins; diminutive colon polyp in the rectum, otherwise normal exam.  Pathology with tubular adenoma.  Recommended 5-year repeat.   COLONOSCOPY  07/12/2014   Dr. Edrick Kins; submucosal hemorrhages in proximal descending colon?  Prior ischemic or infectious colitis?,  Otherwise normal exam.  Recommended 5-year repeat.   COLONOSCOPY WITH  PROPOFOL N/A 07/22/2021   Procedure: COLONOSCOPY WITH PROPOFOL;  Surgeon: Lanelle Bal, DO;  Location: [facility];  Service: Endoscopy;  Laterality: N/A;  8:45am   ESOPHAGOGASTRODUODENOSCOPY  07/21/2004   Dr. Edrick Kins; grade 2 erosive esophagitis at EG junction, cervical inlet patch, otherwise normal exam.    Current Outpatient Medications  Medication Sig Dispense Refill   AIMOVIG 140 MG/ML SOAJ as directed Subcutaneous monthly     aspirin EC 81 MG tablet Take 81 mg by mouth daily. Swallow whole.     atorvastatin (LIPITOR) 80 MG tablet Take 80 mg by mouth in the morning.     baclofen (LIORESAL) 10 MG tablet Take 10 mg by mouth 3 (three) times daily.     busPIRone (BUSPAR) 15 MG tablet Take 15 mg by mouth 2 (two) times daily.     cholecalciferol (VITAMIN  D3) 25 MCG (1000 UNIT) tablet Take 1,000 Units by mouth daily.     DULoxetine (CYMBALTA) 60 MG capsule Take 60 mg by mouth daily.     Liraglutide (VICTOZA [place]) Inject into the skin daily.     lisinopril (ZESTRIL) 5 MG tablet Take 5 mg by mouth in the morning.     magnesium 30 MG tablet Take 30 mg by mouth 2 (two) times daily.     Meloxicam 15 MG TBDP Take by mouth.     metFORMIN (GLUCOPHAGE) 500 MG tablet Take 1 tablet (500 mg total) by mouth 2 (two) times daily with a meal. 60 tablet 0   metoprolol succinate (TOPROL-XL) 25 MG 24 hr tablet Take 25 mg by mouth in the morning.     Multiple Vitamin (MULTIVITAMIN WITH MINERALS) TABS tablet Take 1 tablet by mouth in the morning.     omeprazole (PRILOSEC) 40 MG capsule Take 1 capsule (40 mg total) by mouth daily before breakfast. 30 capsule 3   tiZANidine (ZANAFLEX) 2 MG tablet Take 1 tablet (2 mg total) by mouth 3 (three) times daily as needed for muscle spasms. 90 tablet 5   UBRELVY 100 MG TABS Take by mouth.     vitamin C (ASCORBIC ACID) 500 MG tablet Take 500 mg by mouth in the morning.     Current Facility-Administered Medications  Medication Dose Route Frequency Provider Last Rate Last Admin   Semaglutide-Weight Management SOAJ 0.25 mg  0.25 mg Subcutaneous Once         Allergies as of 05/26/2023   (No Known Allergies)    Family History  Problem Relation Age of Onset   AAA (abdominal aortic aneurysm) Mother    Sudden death Mother    Alcoholism Mother    Dementia Father    Asthma Sister    Kidney cancer Sister        had kidney removed   Diabetes Sister    Lymphoma Nephew    Colon cancer Neg Hx     Social History   Socioeconomic History   Marital status: Significant Other    Spouse name: Not on file   Number of children: Not on file   Years of education: Not on file   Highest education level: Not on file  Occupational History   Not on file  Tobacco Use   Smoking status: Former    Current packs/day: 0.00    Types:  Cigarettes    Quit date: 09/01/1999    Years since quitting: 23.7    Passive exposure: Never   Smokeless tobacco: Never   Tobacco comments:    smoked  for approx 20 years   Vaping Use   Vaping status: Never Used  Substance and Sexual Activity   Alcohol use: Not Currently   Drug use: Yes    Types: Marijuana    Comment: occasionally-cbd   Sexual activity: Yes    Birth control/protection: Post-menopausal  Other Topics Concern   Not on file  Social History Narrative   Are you right handed or left handed? Right   Are you currently employed ? Retired   What is your current occupation?   Do you live at home alone? NO    Who lives with you?    What type of home do you live in: 1 story or 2 story? 1       Social Determinants of Health   Financial Resource Strain: Low Risk  (12/16/2022)   Overall Financial Resource Strain (CARDIA)    Difficulty of Paying Living Expenses: Not very hard  Food Insecurity: No Food Insecurity (12/16/2022)   Hunger Vital Sign    Worried About Running Out of Food in the Last Year: Never true    Ran Out of Food in the Last Year: Never true  Transportation Needs: No Transportation Needs (12/16/2022)   PRAPARE - Administrator, Civil Service (Medical): No    Lack of Transportation (Non-Medical): No  Physical Activity: Sufficiently Active (12/16/2022)   Exercise Vital Sign    Days of Exercise per Week: 3 days    Minutes of Exercise per Session: 60 min  Stress: Stress Concern Present (12/16/2022)   [employer] of Occupational Health - Occupational Stress Questionnaire    Feeling of Stress : Very much  Social Connections: Socially Integrated (12/16/2022)   Social Connection and Isolation Panel [NHANES]    Frequency of Communication with Friends and Family: More than three times a week    Frequency of Social Gatherings with Friends and Family: Three times a week    Attends Religious Services: More than 4 times per year    Active Member of Clubs or  Organizations: Yes    Attends Engineer, structural: More than 4 times per year    Marital Status: Living with partner    Review of Systems: Gen: Denies fever, chills, cold or flulike symptoms, presyncope, syncope. CV: Denies chest pain, palpitations. Resp: Denies dyspnea, cough. GI: See HPI Heme: See HPI  Physical Exam: BP 125/78 (BP Location: Right Arm, Patient Position: Sitting, Cuff Size: Large)   Pulse 71   Temp 97.7 F (36.5 C) (Temporal)   Ht 5\' 1"  (1.549 m)   Wt 225 lb 12.8 oz (102.4 kg)   SpO2 96%   BMI 42.66 kg/m  General:   Alert and oriented. No distress noted. Pleasant and cooperative.  Head:  Normocephalic and atraumatic. Eyes:  Conjuctiva clear without scleral icterus. Heart:  S1, S2 present without murmurs appreciated. Lungs:  Clear to auscultation bilaterally. No wheezes, rales, or rhonchi. No distress.  Abdomen:  +BS, soft, non-tender and non-distended. No rebound or guarding. No HSM or masses noted. Msk:  Symmetrical without gross deformities. Normal posture. Extremities:  Without edema. Neurologic:  Alert and  oriented x4 Psych:  Normal mood and affect.    Assessment:  62 year old female with history of anxiety, arthritis, HTN, fibromyalgia, anxiety, depression, HLD, diabetes, GERD, presenting today for further evaluation of atypical chest pain, GERD, epigastric pain, dysphagia.  GERD: Chronic, but had previously been fairly infrequent, but with worsening symptoms over the last 6 months, now with daily symptoms.  Associated intermittent nausea, substernal chest pain radiating up to her jaw and her back.  Currently taking omeprazole 20 mg daily, but symptoms are not adequately managed.  We will increase omeprazole to 40 mg daily.  Dysphagia: New onset solid food and pill dysphagia over the last several months.  This has been in the setting of uncontrolled GERD.  May have esophageal web, ring, stricture.  Less likely malignancy.  She needs EGD for  further evaluation.  Epigastric pain: New onset intermittent epigastric pain over the last several months.  Symptoms may be related to uncontrolled GERD, but in the setting of NSAIDs, concern for gastritis, duodenitis, PUD.  Additional differentials include H. pylori and less likely biliary etiology or malignancy.  We are increasing omeprazole to 40 mg daily, updating labs, and proceeding with EGD for further evaluation.  Black stool: Fairly frequent black stool, but also taking Pepto-Bismol a couple times a week.  However, in the setting of epigastric pain, uncontrolled GERD, NSAID use, unable to rule out upper GI bleeding related to peptic ulcer disease, gastritis, duodenitis, etc.  Will update CBC.  Also planning for EGD in the near future.   Plan:  CBC, CMP Proceed with upper endoscopy +/- dilation with propofol by Dr. Marletta Lor in near future. The risks, benefits, and alternatives have been discussed with the patient in detail. The patient states understanding and desires to proceed.  ASA 3 Hold Victoza x 24 hours prior to EGD. If Ozempic is started, hold 1 week prior to EGD. No morning diabetes medications. Increase omeprazole to 40 mg daily before breakfast.  New prescription sent to pharmacy. Limit meloxicam is much as possible and avoid all other NSAIDs. Use Tylenol first for arthritis pain.  No more than 3000 mg of Tylenol per 24 hours. Dysphagia precautions discussed.  Separate written instructions were provided on AVS. Counseled on GERD diet.  Separate written instructions provided on AVS. Follow-up after procedure.    Ermalinda Memos, PA-C [facility] 05/26/2023

## 2023-05-25 NOTE — Patient Instructions (Addendum)

## 2023-05-25 NOTE — Progress Notes (Signed)
Office Visit    Patient Name: Cameisha Wiecek Date of Encounter: 05/25/2023 PCP:  Waldon Reining, MD  [facility]  Cardiologist:  [name], MD  Advanced Practice Provider:  No care team member to display Electrophysiologist:  None   Chief Complaint    Marwa Varin is a 62 y.o. female with a hx of atypical chest pain, palpitations, type 2 diabetes, hyperlipidemia, hypertension, vertigo, GERD, ADD, morbid obesity, and anxiety/depression, who presents today for follow-up.  Last seen by Dr. Diona Browner on April 29, 2022.  She had noted intermittent chest discomfort at that time, noted as a pressure/indigestion sensation in the central sternal area, noted neck discomfort.  Her symptoms are nonexertional.  She was very active at the time.  She lost over 30 pounds over the past several months, and changed her diet.  She underwent Myoview for evaluation, was deemed a low risk study.  Last seen for follow-up Jan 29, 2023. Had initial visit with [facility], had upcoming labs arranged with them. No change in symptoms. Attributed her symptoms to her weight. Denied any chest pain, palpitations, syncope, presyncope, dizziness, orthopnea, PND, swelling or significant weight changes, acute bleeding, or claudication.   Today she presents for follow-up.  Doing well, but struggling to lose weight.  Says she has enjoyed her visits to [facility], but she prefers going to weight watchers instead.  Continues to notice shortness of breath, attributes this to her weight. Denies any palpitations, syncope, presyncope, dizziness, orthopnea, PND, swelling or significant weight changes, acute bleeding, or claudication.  Does admit to random episodes of mid chest pain that radiates to her right jaw a couple times since last office visit, brief in duration, admits to sensation of feeling like food gets caught after swallowing.  EKGs/Labs/Other Studies  Reviewed:   The following studies were reviewed today:   EKG:  EKG is not ordered today.    Myoview 05/2022:   Findings are consistent with no prior ischemia. The study is low risk.   No ST deviation was noted.   Left ventricular function is normal. End diastolic cavity size is normal. End systolic cavity size is normal.   Prior study not available for comparison.   IMPRESSIONS Negative for stress induced arrhythmias.  Stress ECG nondiagnostic due to pharmacologic protocol. Normal left ventricular function and size. There is an anteroseptal perfusion defect in rest that that partial improves with stress and with normal wall motion.  Artifact is favored over infarct. There is diaphragmatic activity at rest that is not present at stress.   CONCLUSIONS No ischemia. Low risk study.   Cardiac monitor 02/2022: Zio XT reviewed. 6 days, 16 hours analyzed:   Predominant rhythm is sinus with prolonged PR interval. Heart rate ranged from 47 bpm up to 106 bpm with average heart rate 65 bpm. There were rare PACs including couplets and triplets representing less than 1% total beats. There were rare PVCs representing less than 1% total beats. Single, very brief episode of SVT noted lasting 6 beats. Very brief and rare dropped beats were noted (second degree type 1 or 2). No patient triggered events or diary entry associated with obvious arrhythmia.   Review of Systems    All other systems reviewed and are otherwise negative except as noted above.  Physical Exam    VS:  BP 122/80   Pulse 72   Ht 5\' 1"  (1.549 m)   Wt 225 lb 6.4 oz (102.2 kg)  SpO2 96%   BMI 42.59 kg/m  , BMI Body mass index is 42.59 kg/m.  Wt Readings from Last 3 Encounters:  05/25/23 225 lb 6.4 oz (102.2 kg)  05/20/23 221 lb (100.2 kg)  04/29/23 216 lb (98 kg)     GEN: Morbidly obese, 62 y.o. female in no acute distress. HEENT: normal. Neck: Supple, no JVD, carotid bruits, or masses. Cardiac: S1/S2, RRR, no  murmurs, rubs, or gallops. No clubbing, cyanosis, edema.  Radials/PT 2+ and equal bilaterally.  Respiratory:  Respirations regular and unlabored, clear to auscultation bilaterally. MS: No deformity or atrophy. Skin: Warm and dry, no rash. Neuro:  Strength and sensation are intact. Psych: Normal affect.  Assessment & Plan    Atypical chest pain Symptoms sound very atypical, possible GI related - see #6.  NST reviewed from last year that was normal.  No indication for ischemic evaluation at this time.  No medication changes at this time.  Will continue to monitor.  Care and ED precautions discussed.  Type 2 Diabetes I do not see a recent A1c on file.  This is being managed by PCP.  Continue follow-up with Dr. Dahlia Client. Continue metformin. Heart healthy diet and regular cardiovascular exercise encouraged.   HLD LDL from June 2024 112.  Continue atorvastatin. Heart healthy diet and regular cardiovascular exercise encouraged.   HTN Blood pressure stable.  Continue lisinopril and metoprolol succinate. Discussed to monitor BP at home at least 2 hours after medications and sitting for 5-10 minutes. Heart healthy diet and regular cardiovascular exercise encouraged.   Morbid obesity, shortness of breath  Experiences shortness of breath when bending over to tie her shoes, attributes this to her weight, which I am in agreement with.  She prefers not to go to the [facility] as this is a significant drive for her from [facility], she is preferring to go to [facility] here locally.  Myoview 05/2022 low risk. Continue current medication regimen.  Heart healthy diet and regular cardiovascular exercise encouraged. Continue current medication regimen.  No indication for ischemic evaluation at this time.  6.  GERD Does admit to sensation of feeling like food gets stuck after swallowing.  Atypical chest pain may be due to possible hiatal hernia/esophageal spasm.  Will refer to  GI for further evaluation.  Disposition: Follow up in 6 months with [name], MD or APP.  Signed, Sharlene Dory, NP 05/25/2023, 5:14 PM [facility]

## 2023-05-26 ENCOUNTER — Ambulatory Visit: Payer: 59 | Admitting: Gastroenterology

## 2023-05-26 ENCOUNTER — Encounter: Payer: Self-pay | Admitting: Gastroenterology

## 2023-05-26 VITALS — BP 125/78 | HR 71 | Temp 97.7°F | Ht 61.0 in | Wt 225.8 lb

## 2023-05-26 DIAGNOSIS — K219 Gastro-esophageal reflux disease without esophagitis: Secondary | ICD-10-CM | POA: Diagnosis not present

## 2023-05-26 DIAGNOSIS — R1013 Epigastric pain: Secondary | ICD-10-CM

## 2023-05-26 DIAGNOSIS — R1310 Dysphagia, unspecified: Secondary | ICD-10-CM | POA: Diagnosis not present

## 2023-05-26 DIAGNOSIS — K921 Melena: Secondary | ICD-10-CM

## 2023-05-26 MED ORDER — OMEPRAZOLE 40 MG PO CPDR: 40.0000 mg | DELAYED_RELEASE_CAPSULE | Freq: Every day | ORAL | 3 refills | Status: DC

## 2023-05-26 NOTE — Patient Instructions (Addendum)
Have labs completed at [facility].  [address], [place], [place] [address]  We will get you scheduled for an upper endoscopy with possible dilation of your esophagus in the near future with Dr. Marletta Lor. You will need to hold Victoza for 24 hours prior to your procedure. If Ozempic is started, you will need to hold this for 1 week prior to your procedure. No morning diabetes medications day of your procedure.  Increase omeprazole to 40 mg daily 30 minutes before breakfast.  Limit meloxicam is much as possible and avoid all other NSAIDs including ibuprofen, Aleve, Advil, BC powders, Goody powders, needing this as "NSAID" on the package.  Use Tylenol first for arthritis pain.  Do not take more than 3000 mg of Tylenol per 24 hours.  Swallowing precautions:  Eat slowly, take small bites, chew thoroughly, drink plenty of liquids throughout meals.  Avoid trough textures All meats should be chopped finely.  If something gets hung in your esophagus and will not come up or go down, proceed to the emergency room.    Follow a GERD diet:  Avoid fried, fatty, greasy, spicy, citrus foods. Avoid caffeine and carbonated beverages. Avoid chocolate. Try eating 4-6 small meals a day rather than 3 large meals. Do not eat within 3 hours of laying down. Prop head of bed up on wood or bricks to create a 6 inch incline.

## 2023-05-27 ENCOUNTER — Other Ambulatory Visit: Payer: Self-pay | Admitting: Gastroenterology

## 2023-05-27 ENCOUNTER — Other Ambulatory Visit: Payer: Self-pay | Admitting: *Deleted

## 2023-05-27 DIAGNOSIS — R7401 Elevation of levels of liver transaminase levels: Secondary | ICD-10-CM

## 2023-05-27 LAB — CMP14+EGFR
ALT: 47 IU/L — ABNORMAL HIGH (ref 0–32)
AST: 31 IU/L (ref 0–40)
Albumin: 4.7 g/dL (ref 3.9–4.9)
Alkaline Phosphatase: 109 IU/L (ref 44–121)
BUN/Creatinine Ratio: 28 (ref 12–28)
BUN: 22 mg/dL (ref 8–27)
Bilirubin Total: 0.7 mg/dL (ref 0.0–1.2)
CO2: 23 mmol/L (ref 20–29)
Calcium: 10.1 mg/dL (ref 8.7–10.3)
Chloride: 97 mmol/L (ref 96–106)
Creatinine, Ser: 0.78 mg/dL (ref 0.57–1.00)
Globulin, Total: 2.8 g/dL (ref 1.5–4.5)
Glucose: 116 mg/dL — ABNORMAL HIGH (ref 70–99)
Potassium: 4.6 mmol/L (ref 3.5–5.2)
Sodium: 139 mmol/L (ref 134–144)
Total Protein: 7.5 g/dL (ref 6.0–8.5)
eGFR: 86 mL/min/{1.73_m2} (ref >59)

## 2023-05-27 LAB — CBC WITH DIFFERENTIAL/PLATELET
Basophils Absolute: 0.1 10*3/uL (ref 0.0–0.2)
Basos: 1 %
EOS (ABSOLUTE): 0.2 10*3/uL (ref 0.0–0.4)
Eos: 3 %
Hematocrit: 45 % (ref 34.0–46.6)
Hemoglobin: 14.4 g/dL (ref 11.1–15.9)
Immature Grans (Abs): 0 10*3/uL (ref 0.0–0.1)
Immature Granulocytes: 0 %
Lymphocytes Absolute: 2.6 10*3/uL (ref 0.7–3.1)
Lymphs: 41 %
MCH: 28.5 pg (ref 26.6–33.0)
MCHC: 32 g/dL (ref 31.5–35.7)
MCV: 89 fL (ref 79–97)
Monocytes Absolute: 0.7 10*3/uL (ref 0.1–0.9)
Monocytes: 11 %
Neutrophils Absolute: 2.8 10*3/uL (ref 1.4–7.0)
Neutrophils: 44 %
Platelets: 330 10*3/uL (ref 150–450)
RBC: 5.06 x10E6/uL (ref 3.77–5.28)
RDW: 13 % (ref 11.7–15.4)
WBC: 6.4 10*3/uL (ref 3.4–10.8)

## 2023-05-31 ENCOUNTER — Encounter: Payer: Self-pay | Admitting: *Deleted

## 2023-06-02 ENCOUNTER — Ambulatory Visit (HOSPITAL_COMMUNITY)
Admission: RE | Admit: 2023-06-02 | Discharge: 2023-06-02 | Disposition: A | Discharge status: Home / Self Care | Payer: 59 | Source: Ambulatory Visit | Attending: Gastroenterology | Admitting: Gastroenterology

## 2023-06-02 DIAGNOSIS — R7401 Elevation of levels of liver transaminase levels: Secondary | ICD-10-CM | POA: Diagnosis not present

## 2023-06-03 LAB — IRON,TIBC AND FERRITIN PANEL
Ferritin: 96 ng/mL (ref 15–150)
Iron Saturation: 29 % (ref 15–55)
Iron: 105 ug/dL (ref 27–139)
Total Iron Binding Capacity: 357 ug/dL (ref 250–450)
UIBC: 252 ug/dL (ref 118–369)

## 2023-06-03 LAB — HEPATITIS B SURFACE ANTIGEN: Hepatitis B Surface Ag: NEGATIVE

## 2023-06-03 LAB — HEPATITIS B SURFACE ANTIBODY,QUALITATIVE: Hep B Surface Ab, Qual: NONREACTIVE

## 2023-06-03 LAB — HEPATITIS C ANTIBODY: Hep C Virus Ab: NONREACTIVE

## 2023-06-03 LAB — HEPATITIS B CORE ANTIBODY, TOTAL: Hep B Core Total Ab: NEGATIVE

## 2023-06-07 ENCOUNTER — Encounter: Payer: Self-pay | Admitting: *Deleted

## 2023-06-07 ENCOUNTER — Other Ambulatory Visit: Payer: Self-pay | Admitting: Gastroenterology

## 2023-06-07 DIAGNOSIS — F419 Anxiety disorder, unspecified: Secondary | ICD-10-CM | POA: Diagnosis not present

## 2023-06-07 DIAGNOSIS — K219 Gastro-esophageal reflux disease without esophagitis: Secondary | ICD-10-CM

## 2023-06-07 DIAGNOSIS — F329 Major depressive disorder, single episode, unspecified: Secondary | ICD-10-CM | POA: Diagnosis not present

## 2023-06-07 MED ORDER — OMEPRAZOLE 40 MG PO CPDR: 40.0000 mg | DELAYED_RELEASE_CAPSULE | Freq: Two times a day (BID) | ORAL | 3 refills | Status: DC

## 2023-06-08 DIAGNOSIS — M2550 Pain in unspecified joint: Secondary | ICD-10-CM | POA: Diagnosis not present

## 2023-06-08 DIAGNOSIS — G894 Chronic pain syndrome: Secondary | ICD-10-CM | POA: Diagnosis not present

## 2023-06-08 DIAGNOSIS — Z79899 Other long term (current) drug therapy: Secondary | ICD-10-CM | POA: Diagnosis not present

## 2023-06-08 DIAGNOSIS — Z79891 Long term (current) use of opiate analgesic: Secondary | ICD-10-CM | POA: Diagnosis not present

## 2023-06-08 DIAGNOSIS — M797 Fibromyalgia: Secondary | ICD-10-CM | POA: Diagnosis not present

## 2023-06-08 DIAGNOSIS — M150 Primary generalized (osteo)arthritis: Secondary | ICD-10-CM | POA: Diagnosis not present

## 2023-06-10 ENCOUNTER — Ambulatory Visit: Payer: 59 | Admitting: Bariatrics

## 2023-06-22 ENCOUNTER — Telehealth: Payer: Self-pay | Admitting: Internal Medicine

## 2023-06-22 NOTE — Telephone Encounter (Signed)
Pt needs to reschedule pre op and possibly procedure with Dr Marletta Lor on NOV 4. Please call [phone]

## 2023-06-22 NOTE — Telephone Encounter (Signed)
Pt states she can't do 07/02/23 for her pre-op visit and needs to reschedule. Gave number to call Eber Jones to call and reschedule pre-op.

## 2023-06-23 ENCOUNTER — Ambulatory Visit: Payer: 59 | Admitting: Neurology

## 2023-06-28 ENCOUNTER — Telehealth (INDEPENDENT_AMBULATORY_CARE_PROVIDER_SITE_OTHER): Payer: 59 | Admitting: Psychology

## 2023-06-28 DIAGNOSIS — F329 Major depressive disorder, single episode, unspecified: Secondary | ICD-10-CM | POA: Diagnosis not present

## 2023-06-28 DIAGNOSIS — G2581 Restless legs syndrome: Secondary | ICD-10-CM | POA: Diagnosis not present

## 2023-06-28 DIAGNOSIS — F419 Anxiety disorder, unspecified: Secondary | ICD-10-CM | POA: Diagnosis not present

## 2023-06-28 DIAGNOSIS — E559 Vitamin D deficiency, unspecified: Secondary | ICD-10-CM | POA: Diagnosis not present

## 2023-06-29 DIAGNOSIS — E119 Type 2 diabetes mellitus without complications: Secondary | ICD-10-CM | POA: Diagnosis not present

## 2023-06-29 DIAGNOSIS — R5382 Chronic fatigue, unspecified: Secondary | ICD-10-CM | POA: Diagnosis not present

## 2023-06-29 DIAGNOSIS — I10 Essential (primary) hypertension: Secondary | ICD-10-CM | POA: Diagnosis not present

## 2023-06-29 DIAGNOSIS — G2581 Restless legs syndrome: Secondary | ICD-10-CM | POA: Diagnosis not present

## 2023-06-29 DIAGNOSIS — Z79899 Other long term (current) drug therapy: Secondary | ICD-10-CM | POA: Diagnosis not present

## 2023-06-29 DIAGNOSIS — E559 Vitamin D deficiency, unspecified: Secondary | ICD-10-CM | POA: Diagnosis not present

## 2023-06-29 DIAGNOSIS — E785 Hyperlipidemia, unspecified: Secondary | ICD-10-CM | POA: Diagnosis not present

## 2023-06-30 NOTE — Patient Instructions (Signed)
Greeta Randle  06/30/2023     @PREFPERIOPPHARMACY @   Your procedure is scheduled on 07/05/2023.  Report to [facility] at 9:15 A.M.  Call this number if you have problems the morning of surgery:  [phone]  If you experience any cold or flu symptoms such as cough, fever, chills, shortness of breath, etc. between now and your scheduled surgery, please notify us at the above number.   Remember:   Please follow the diet instructions given to you by Dr Queen [facility]oss[facility] office.   You may drink clear liquids until 7:15 am .    Clear liquids allowed are:                    Water, Carbonated beverages (diabetics please choose diet or no sugar options), Clear Tea (No creamer, milk, or cream, including half & half and powdered creamer), and Clear Sports drink (No red color; diabetics please choose diet or no sugar options)    Take these medicines the morning of surgery with A SIP OF WATER : Metoprolol Omeprazole Zanaflex Baclofen Buspar Cymbalta Meloxicam     Do not wear jewelry, make-up or nail polish, including gel polish,  artificial nails, or any other type of covering on natural nails (fingers and  toes).  Do not wear lotions, powders, or perfumes, or deodorant.  Do not shave 48 hours prior to surgery.  Men may shave face and neck.  Do not bring valuables to the hospital.  [facility] is not responsible for any belongings or valuables.  Contacts, dentures or bridgework may not be worn into surgery.  Leave your suitcase in the car.  After surgery it may be brought to your room.  For patients admitted to the hospital, discharge time will be determined by your treatment team.  Patients discharged the day of surgery will not be allowed to drive home.   Name and phone number of your driver:   Family Special instructions:  N/A  Please read over the following fact sheets that you were given. Care and Recovery After Surgery   Upper Endoscopy, Adult Upper endoscopy is a procedure to look  inside the upper GI (gastrointestinal) tract. The upper GI tract is made up of: The esophagus. This is the part of the body that moves food from your mouth to your stomach. The stomach. The duodenum. This is the first part of your small intestine. This procedure is also called esophagogastroduodenoscopy (EGD) or gastroscopy. In this procedure, your health care provider passes a thin, flexible tube (endoscope) through your mouth and down your esophagus into your stomach and into your duodenum. A small camera is attached to the end of the tube. Images from the camera appear on a monitor in the exam room. During this procedure, your health care provider may also remove a small piece of tissue to be sent to a lab and examined under a microscope (biopsy). Your health care provider may do an upper endoscopy to diagnose cancers of the upper GI tract. You may also have this procedure to find the cause of other conditions, such as: Stomach pain. Heartburn. Pain or problems when swallowing. Nausea and vomiting. Stomach bleeding. Stomach ulcers. Tell a health care provider about: Any allergies you have. All medicines you are taking, including vitamins, herbs, eye drops, creams, and over-the-counter medicines. Any problems you or family members have had with anesthetic medicines. Any bleeding problems you have. Any surgeries you have had. Any medical conditions you have. Whether you are  pregnant or may be pregnant. What are the risks? Your healthcare provider will talk with you about risks. These may include: Infection. Bleeding. Allergic reactions to medicines. A tear or hole (perforation) in the esophagus, stomach, or duodenum. What happens before the procedure? When to stop eating and drinking Follow instructions from your health care provider about what you may eat and drink. These may include: 8 hours before your procedure Stop eating most foods. Do not eat meat, fried foods, or fatty  foods. Eat only light foods, such as toast or crackers. All liquids are okay except energy drinks and alcohol. 6 hours before your procedure Stop eating. Drink only clear liquids, such as water, clear fruit juice, black coffee, plain tea, and sports drinks. Do not drink energy drinks or alcohol. 2 hours before your procedure Stop drinking all liquids. You may be allowed to take medicines with small sips of water. If you do not follow your health care provider's instructions, your procedure may be delayed or canceled. Medicines Ask your health care provider about: Changing or stopping your regular medicines. This is especially important if you are taking diabetes medicines or blood thinners. Taking medicines such as aspirin and ibuprofen. These medicines can thin your blood. Do not take these medicines unless your health care provider tells you to take them. Taking over-the-counter medicines, vitamins, herbs, and supplements. General instructions If you will be going home right after the procedure, plan to have a responsible adult: Take you home from the hospital or clinic. You will not be allowed to drive. Care for you for the time you are told. What happens during the procedure?  An IV will be inserted into one of your veins. You may be given one or more of the following: A medicine to help you relax (sedative). A medicine to numb the throat (local anesthetic). You will lie on your left side on an exam table. Your health care provider will pass the endoscope through your mouth and down your esophagus. Your health care provider will use the scope to check the inside of your esophagus, stomach, and duodenum. Biopsies may be taken. The endoscope will be removed. The procedure may vary among health care providers and hospitals. What happens after the procedure? Your blood pressure, heart rate, breathing rate, and blood oxygen level will be monitored until you leave the hospital or  clinic. When your throat is no longer numb, you may be given some fluids to drink. If you were given a sedative during the procedure, it can affect you for several hours. Do not drive or operate machinery until your health care provider says that it is safe. It is up to you to get the results of your procedure. Ask your health care provider, or the department that is doing the procedure, when your results will be ready. Contact a health care provider if you: Have a sore throat that lasts longer than 1 day. Have a fever. Get help right away if you: Vomit blood or your vomit looks like coffee grounds. Have bloody, black, or tarry stools. Have a very bad sore throat or you cannot swallow. Have difficulty breathing or very bad pain in your chest or abdomen. These symptoms may be an emergency. Get help right away. Call 911. Do not wait to see if the symptoms will go away. Do not drive yourself to the hospital. Summary Upper endoscopy is a procedure to look inside the upper GI tract. During the procedure, an IV will be inserted into one  of your veins. You may be given a medicine to help you relax. The endoscope will be passed through your mouth and down your esophagus. Follow instructions from your health care provider about what you can eat and drink. This information is not intended to replace advice given to you by your health care provider. Make sure you discuss any questions you have with your health care provider. Document Revised: 11/26/2021 Document Reviewed: 11/26/2021 Elsevier Patient Education  2024 [employer]  Monitored Anesthesia Care Anesthesia refers to the techniques, procedures, and medicines that help a person stay safe and comfortable during surgery. Monitored anesthesia care, or sedation, is one type of anesthesia. You may have sedation if you do not need to be asleep for your procedure. Procedures that use sedation may include: Surgery to remove cataracts from your  eyes. A dental procedure. A biopsy. This is when a tissue sample is removed and looked at under a microscope. You will be watched closely during your procedure. Your level of sedation or type of anesthesia may be changed to fit your needs. Tell a health care provider about: Any allergies you have. All medicines you are taking, including vitamins, herbs, eye drops, creams, and over-the-counter medicines. Any problems you or family members have had with anesthesia. Any bleeding problems you have. Any surgeries you have had. Any medical conditions or illnesses you have. This includes sleep apnea, cough, fever, or the flu. Whether you are pregnant or may be pregnant. Whether you use cigarettes, alcohol, or drugs. Any use of steroids, whether by mouth or as a cream. What are the risks? Your health care provider will talk with you about risks. These may include: Getting too much medicine (oversedation). Nausea. Allergic reactions to medicines. Trouble breathing. If this happens, a breathing tube may be used to help you breathe. It will be removed when you are awake and breathing on your own. Heart trouble. Lung trouble. Confusion that gets better with time (emergence delirium). What happens before the procedure? When to stop eating and drinking Follow instructions from your health care provider about what you may eat and drink. These may include: 8 hours before your procedure Stop eating most foods. Do not eat meat, fried foods, or fatty foods. Eat only light foods, such as toast or crackers. All liquids are okay except energy drinks and alcohol. 6 hours before your procedure Stop eating. Drink only clear liquids, such as water, clear fruit juice, black coffee, plain tea, and sports drinks. Do not drink energy drinks or alcohol. 2 hours before your procedure Stop drinking all liquids. You may be allowed to take medicines with small sips of water. If you do not follow your health care  provider's instructions, your procedure may be delayed or canceled. Medicines Ask your health care provider about: Changing or stopping your regular medicines. These include any diabetes medicines or blood thinners you take. Taking medicines such as aspirin and ibuprofen. These medicines can thin your blood. Do not take them unless your health care provider tells you to. Taking over-the-counter medicines, vitamins, herbs, and supplements. Testing You may have an exam or testing. You may have a blood or urine sample taken. General instructions Do not use any products that contain nicotine or tobacco for at least 4 weeks before the procedure. These products include cigarettes, chewing tobacco, and vaping devices, such as e-cigarettes. If you need help quitting, ask your health care provider. If you will be going home right after the procedure, plan to have a responsible  adult: Take you home from the hospital or clinic. You will not be allowed to drive. Care for you for the time you are told. What happens during the procedure?  Your blood pressure, heart rate, breathing, level of pain, and blood oxygen level will be monitored. An IV will be inserted into one of your veins. You may be given: A sedative. This helps you relax. Anesthesia. This will: Numb certain areas of your body. Make you fall asleep for surgery. You will be given medicines as needed to keep you comfortable. The more medicine you are given, the deeper your level of sedation will be. Your level of sedation may be changed to fit your needs. There are three levels of sedation: Mild sedation. At this level, you may feel awake and relaxed. You will be able to follow directions. Moderate sedation. At this level, you will be sleepy. You may not remember the procedure. Deep sedation. At this level, you will be asleep. You will not remember the procedure. How you get the medicines will depend on your age and the procedure. They may be  given as: A pill. This may be taken by mouth (orally) or inserted into the rectum. An injection. This may be into a vein or muscle. A spray through the nose. After your procedure is over, the medicine will be stopped. The procedure may vary among health care providers and hospitals. What happens after the procedure? Your blood pressure, heart rate, breathing rate, and blood oxygen level will be monitored until you leave the hospital or clinic. You may feel sleepy, clumsy, or nauseous. You may not remember what happened during or after the procedure. Sedation can affect you for several hours. Do not drive or use machinery until your health care provider says that it is safe. This information is not intended to replace advice given to you by your health care provider. Make sure you discuss any questions you have with your health care provider. Document Revised: 01/12/2022 Document Reviewed: 01/12/2022 Elsevier Patient Education  2024 [employer].

## 2023-07-01 ENCOUNTER — Encounter (HOSPITAL_COMMUNITY): Payer: Self-pay

## 2023-07-01 ENCOUNTER — Encounter (HOSPITAL_COMMUNITY)
Admission: RE | Admit: 2023-07-01 | Discharge: 2023-07-01 | Disposition: A | Discharge status: Home / Self Care | Payer: 59 | Source: Ambulatory Visit | Attending: Internal Medicine | Admitting: Internal Medicine

## 2023-07-01 VITALS — BP 116/68 | HR 59 | Temp 97.8°F | Resp 18 | Ht 61.0 in | Wt 225.8 lb

## 2023-07-01 DIAGNOSIS — E0800 Diabetes mellitus due to underlying condition with hyperosmolarity without nonketotic hyperglycemic-hyperosmolar coma (NKHHC): Secondary | ICD-10-CM | POA: Insufficient documentation

## 2023-07-01 DIAGNOSIS — D500 Iron deficiency anemia secondary to blood loss (chronic): Secondary | ICD-10-CM | POA: Insufficient documentation

## 2023-07-01 DIAGNOSIS — Z01818 Encounter for other preprocedural examination: Secondary | ICD-10-CM | POA: Diagnosis not present

## 2023-07-01 DIAGNOSIS — I10 Essential (primary) hypertension: Secondary | ICD-10-CM | POA: Insufficient documentation

## 2023-07-01 LAB — CBC WITH DIFFERENTIAL/PLATELET
Abs Immature Granulocytes: 0.02 10*3/uL (ref 0.00–0.07)
Basophils Absolute: 0 10*3/uL (ref 0.0–0.1)
Basophils Relative: 1 %
Eosinophils Absolute: 0.4 10*3/uL (ref 0.0–0.5)
Eosinophils Relative: 6 %
HCT: 40.8 % (ref 36.0–46.0)
Hemoglobin: 13.2 g/dL (ref 12.0–15.0)
Immature Granulocytes: 0 %
Lymphocytes Relative: 38 %
Lymphs Abs: 2.7 10*3/uL (ref 0.7–4.0)
MCH: 28.6 pg (ref 26.0–34.0)
MCHC: 32.4 g/dL (ref 30.0–36.0)
MCV: 88.3 fL (ref 80.0–100.0)
Monocytes Absolute: 0.7 10*3/uL (ref 0.1–1.0)
Monocytes Relative: 10 %
Neutro Abs: 3.1 10*3/uL (ref 1.7–7.7)
Neutrophils Relative %: 45 %
Platelets: 326 10*3/uL (ref 150–400)
RBC: 4.62 MIL/uL (ref 3.87–5.11)
RDW: 13.5 % (ref 11.5–15.5)
WBC: 6.9 10*3/uL (ref 4.0–10.5)
nRBC: 0 % (ref 0.0–0.2)

## 2023-07-01 LAB — BASIC METABOLIC PANEL
Anion gap: 9 (ref 5–15)
BUN: 24 mg/dL — ABNORMAL HIGH (ref 8–23)
CO2: 23 mmol/L (ref 22–32)
Calcium: 9 mg/dL (ref 8.9–10.3)
Chloride: 101 mmol/L (ref 98–111)
Creatinine, Ser: 0.68 mg/dL (ref 0.44–1.00)
GFR, Estimated: >60 mL/min (ref >60)
Glucose, Bld: 112 mg/dL — ABNORMAL HIGH (ref 70–99)
Potassium: 4 mmol/L (ref 3.5–5.1)
Sodium: 133 mmol/L — ABNORMAL LOW (ref 135–145)

## 2023-07-01 NOTE — Progress Notes (Signed)
PAT visit completed. Pt verbalized understanding on procedure instructions and arrival time.   

## 2023-07-02 ENCOUNTER — Other Ambulatory Visit (HOSPITAL_COMMUNITY): Payer: 59

## 2023-07-05 ENCOUNTER — Ambulatory Visit (HOSPITAL_COMMUNITY)
Admission: RE | Admit: 2023-07-05 | Discharge: 2023-07-05 | Disposition: A | Discharge status: Home / Self Care | Payer: 59 | Attending: Internal Medicine | Admitting: Internal Medicine

## 2023-07-05 ENCOUNTER — Ambulatory Visit (HOSPITAL_COMMUNITY): Payer: 59 | Admitting: Certified Registered"

## 2023-07-05 ENCOUNTER — Encounter (HOSPITAL_COMMUNITY)
Admission: RE | Disposition: A | Discharge status: Home / Self Care | Payer: Self-pay | Source: Home / Self Care | Attending: Internal Medicine

## 2023-07-05 ENCOUNTER — Ambulatory Visit (HOSPITAL_BASED_OUTPATIENT_CLINIC_OR_DEPARTMENT_OTHER): Payer: 59 | Admitting: Certified Registered"

## 2023-07-05 ENCOUNTER — Encounter (HOSPITAL_COMMUNITY): Payer: Self-pay

## 2023-07-05 DIAGNOSIS — R1013 Epigastric pain: Secondary | ICD-10-CM | POA: Diagnosis not present

## 2023-07-05 DIAGNOSIS — Z7985 Long-term (current) use of injectable non-insulin antidiabetic drugs: Secondary | ICD-10-CM | POA: Insufficient documentation

## 2023-07-05 DIAGNOSIS — K449 Diaphragmatic hernia without obstruction or gangrene: Secondary | ICD-10-CM | POA: Insufficient documentation

## 2023-07-05 DIAGNOSIS — K2289 Other specified disease of esophagus: Secondary | ICD-10-CM | POA: Diagnosis not present

## 2023-07-05 DIAGNOSIS — G4730 Sleep apnea, unspecified: Secondary | ICD-10-CM | POA: Insufficient documentation

## 2023-07-05 DIAGNOSIS — E119 Type 2 diabetes mellitus without complications: Secondary | ICD-10-CM

## 2023-07-05 DIAGNOSIS — Z860101 Personal history of adenomatous and serrated colon polyps: Secondary | ICD-10-CM | POA: Insufficient documentation

## 2023-07-05 DIAGNOSIS — Z8249 Family history of ischemic heart disease and other diseases of the circulatory system: Secondary | ICD-10-CM | POA: Insufficient documentation

## 2023-07-05 DIAGNOSIS — I10 Essential (primary) hypertension: Secondary | ICD-10-CM | POA: Insufficient documentation

## 2023-07-05 DIAGNOSIS — K2950 Unspecified chronic gastritis without bleeding: Secondary | ICD-10-CM | POA: Insufficient documentation

## 2023-07-05 DIAGNOSIS — Z87891 Personal history of nicotine dependence: Secondary | ICD-10-CM | POA: Diagnosis not present

## 2023-07-05 DIAGNOSIS — Z833 Family history of diabetes mellitus: Secondary | ICD-10-CM | POA: Insufficient documentation

## 2023-07-05 DIAGNOSIS — Z7984 Long term (current) use of oral hypoglycemic drugs: Secondary | ICD-10-CM | POA: Diagnosis not present

## 2023-07-05 DIAGNOSIS — K2970 Gastritis, unspecified, without bleeding: Secondary | ICD-10-CM | POA: Diagnosis not present

## 2023-07-05 DIAGNOSIS — K319 Disease of stomach and duodenum, unspecified: Secondary | ICD-10-CM | POA: Diagnosis not present

## 2023-07-05 DIAGNOSIS — R1310 Dysphagia, unspecified: Secondary | ICD-10-CM | POA: Diagnosis not present

## 2023-07-05 DIAGNOSIS — K219 Gastro-esophageal reflux disease without esophagitis: Secondary | ICD-10-CM | POA: Insufficient documentation

## 2023-07-05 DIAGNOSIS — K2270 Barrett's esophagus without dysplasia: Secondary | ICD-10-CM | POA: Insufficient documentation

## 2023-07-05 HISTORY — PX: ESOPHAGOGASTRODUODENOSCOPY (EGD) WITH PROPOFOL: SHX5813

## 2023-07-05 HISTORY — PX: BIOPSY: SHX5522

## 2023-07-05 LAB — GLUCOSE, CAPILLARY: Glucose-Capillary: 118 mg/dL — ABNORMAL HIGH (ref 70–99)

## 2023-07-05 SURGERY — ESOPHAGOGASTRODUODENOSCOPY (EGD) WITH PROPOFOL
Anesthesia: General

## 2023-07-05 MED ORDER — PROPOFOL 10 MG/ML IV BOLUS
INTRAVENOUS | Status: DC | PRN
  Administered 2023-07-05: 70 mg via INTRAVENOUS
  Administered 2023-07-05: 50 mg via INTRAVENOUS
  Administered 2023-07-05: 130 mg via INTRAVENOUS

## 2023-07-05 MED ORDER — LACTATED RINGERS IV SOLN: INTRAVENOUS | Status: DC | PRN

## 2023-07-05 MED ORDER — LIDOCAINE HCL (CARDIAC) PF 100 MG/5ML IV SOSY
PREFILLED_SYRINGE | INTRAVENOUS | Status: DC | PRN
  Administered 2023-07-05: 50 mg via INTRAVENOUS

## 2023-07-05 NOTE — Anesthesia Procedure Notes (Signed)
Date/Time: 07/05/2023 11:45 AM  Performed by: Julian Reil, CRNAPre-anesthesia Checklist: Patient identified, Emergency Drugs available, Suction available and Patient being monitored Patient Re-evaluated:Patient Re-evaluated prior to induction Oxygen Delivery Method: Nasal cannula Induction Type: IV induction Placement Confirmation: positive ETCO2 Comments: Optiflow High Flow [place] II O2 used.

## 2023-07-05 NOTE — Op Note (Signed)
[facility] Patient Name: Sarah Porter Procedure Date: 07/05/2023 11:26 AM MRN: [id number] Date of Birth: [birth year] Attending MD: Hennie Duos. Marletta Lor , [place], [id number] CSN: [id number] Age: 62 Admit Type: Outpatient Procedure:                Upper GI endoscopy Indications:              Epigastric abdominal pain, Dysphagia, Heartburn Providers:                Hennie Duos. Marletta Lor, DO, Nena Polio, RN, Dyann Ruddle Referring MD:              Medicines:                See the Anesthesia note for documentation of the                            administered medications Complications:            No immediate complications. Estimated Blood Loss:     Estimated blood loss was minimal. Procedure:                Pre-Anesthesia Assessment:                           - The anesthesia plan was to use monitored                            anesthesia care (MAC).                           After obtaining informed consent, the endoscope was                            passed under direct vision. Throughout the                            procedure, the patient's blood pressure, pulse, and                            oxygen saturations were monitored continuously. The                            GIF-H190 ([id number]) scope was introduced through the                            mouth, and advanced to the second part of duodenum.                            The upper GI endoscopy was accomplished without                            difficulty. The patient tolerated the procedure                            well. Scope In: 11:44:15 AM Scope Out: 11:49:40 AM Total Procedure Duration: 0 hours 5 minutes 25 seconds  Findings:      The Z-line was irregular. Biopsies  were taken with a cold forceps for       histology. Mild reflux esophagitis      A small hiatal hernia was present.      Patchy mild inflammation characterized by erythema was found in the       gastric body and in the gastric antrum. Biopsies were taken with a  cold       forceps for Helicobacter pylori testing.      The duodenal bulb, first portion of the duodenum and second portion of       the duodenum were normal. Impression:               - Z-line irregular. Biopsied.                           - Small hiatal hernia.                           - Gastritis. Biopsied.                           - Normal duodenal bulb, first portion of the                            duodenum and second portion of the duodenum. Moderate Sedation:      Per Anesthesia Care Recommendation:           - Patient has a contact number available for                            emergencies. The signs and symptoms of potential                            delayed complications were discussed with the                            patient. Return to normal activities tomorrow.                            Written discharge instructions were provided to the                            patient.                           - Resume previous diet.                           - Continue present medications.                           - Await pathology results.                           - Use Prilosec (omeprazole) 40 mg PO BID.                           - Return to GI clinic in 3 months. Procedure Code(s):        ---  Professional ---                           [id number], Esophagogastroduodenoscopy, flexible,                            transoral; with biopsy, single or multiple Diagnosis Code(s):        --- Professional ---                           K22.89, Other specified disease of esophagus                           K44.9, Diaphragmatic hernia without obstruction or                            gangrene                           K29.70, Gastritis, unspecified, without bleeding                           R10.13, Epigastric pain                           R13.10, Dysphagia, unspecified                           R12, Heartburn CPT copyright 2022 American Medical Association. All rights reserved. The  codes documented in this report are preliminary and upon coder review may  be revised to meet current compliance requirements. Hennie Duos. Marletta Lor, DO Hennie Duos. Marletta Lor, DO 07/05/2023 11:51:43 AM This report has been signed electronically. Number of Addenda: 0

## 2023-07-05 NOTE — Transfer of Care (Signed)
Immediate Anesthesia Transfer of Care Note  Patient: Sarah Porter  Procedure(s) Performed: ESOPHAGOGASTRODUODENOSCOPY (EGD) WITH PROPOFOL BIOPSY  Patient Location: Short Stay  Anesthesia Type:General  Level of Consciousness: drowsy  Airway & Oxygen Therapy: Patient Spontanous Breathing  Post-op Assessment: Report given to RN and Post -op Vital signs reviewed and stable  Post vital signs: Reviewed and stable  Last Vitals:  Vitals Value Taken Time  BP 96/75 07/05/23 1155  Temp 36.6 C 07/05/23 1155  Pulse 71 07/05/23 1155  Resp 18 07/05/23 1155  SpO2 96 % 07/05/23 1155    Last Pain:  Vitals:   07/05/23 1155  TempSrc: Oral  PainSc: 0-No pain         Complications: No notable events documented.

## 2023-07-05 NOTE — Anesthesia Preprocedure Evaluation (Signed)
Anesthesia Evaluation  Patient identified by MRN, date of birth, ID band Patient awake    Reviewed: Allergy & Precautions, H&P , NPO status , Patient's Chart, lab work & pertinent test results, reviewed documented beta blocker date and time   Airway Mallampati: II  TM Distance: >3 FB Neck ROM: full    Dental no notable dental hx.    Pulmonary neg pulmonary ROS, sleep apnea , Patient abstained from smoking., former smoker   Pulmonary exam normal breath sounds clear to auscultation       Cardiovascular Exercise Tolerance: Good hypertension, negative cardio ROS  Rhythm:regular Rate:Normal     Neuro/Psych  PSYCHIATRIC DISORDERS Anxiety Depression     Neuromuscular disease negative neurological ROS  negative psych ROS   GI/Hepatic negative GI ROS, Neg liver ROS,GERD  ,,  Endo/Other  diabetes  Morbid obesity  Renal/GU negative Renal ROS  negative genitourinary   Musculoskeletal   Abdominal   Peds  Hematology negative hematology ROS (+) Blood dyscrasia, anemia   Anesthesia Other Findings   Reproductive/Obstetrics negative OB ROS                             Anesthesia Physical Anesthesia Plan  ASA: 3  Anesthesia Plan: General   Post-op Pain Management:    Induction:   PONV Risk Score and Plan: Propofol infusion  Airway Management Planned:   Additional Equipment:   Intra-op Plan:   Post-operative Plan:   Informed Consent: I have reviewed the patients History and Physical, chart, labs and discussed the procedure including the risks, benefits and alternatives for the proposed anesthesia with the patient or authorized representative who has indicated his/her understanding and acceptance.     Dental Advisory Given  Plan Discussed with: CRNA  Anesthesia Plan Comments:        Anesthesia Quick Evaluation

## 2023-07-05 NOTE — H&P (Signed)
Primary Care Physician:  Wilmon Pali, FNP Primary Gastroenterologist:  Dr. Marletta Lor  Pre-Procedure History & Physical: HPI:  Sarah Porter is a 62 y.o. female is here for an EGD with possible dilation due to history of dysphagia, GERD, abdominal pain  Past Medical History:  Diagnosis Date   Anemia    Anxiety    Back pain    Chest pain    Constipation    Depression    Essential hypertension    Fibromyalgia    GERD (gastroesophageal reflux disease)    Hyperlipidemia    Joint pain    Osteoarthritis    Palpitations    Sleep apnea    SOB (shortness of breath)    Swelling of lower extremity    Type 2 diabetes mellitus (HCC)    Vertigo     Past Surgical History:  Procedure Laterality Date   BILATERAL KNEE REPAIRS     CESAREAN SECTION     x 2   COLONOSCOPY  11/04/2011   Dr. Edrick Kins; diminutive colon polyp in the rectum, otherwise normal exam.  Pathology with tubular adenoma.  Recommended 5-year repeat.   COLONOSCOPY  07/12/2014   Dr. Edrick Kins; submucosal hemorrhages in proximal descending colon?  Prior ischemic or infectious colitis?,  Otherwise normal exam.  Recommended 5-year repeat.   COLONOSCOPY WITH PROPOFOL N/A 07/22/2021   Procedure: COLONOSCOPY WITH PROPOFOL;  Surgeon: Lanelle Bal, DO;  Location: [facility];  Service: Endoscopy;  Laterality: N/A;  8:45am   ESOPHAGOGASTRODUODENOSCOPY  07/21/2004   Dr. Edrick Kins; grade 2 erosive esophagitis at EG junction, cervical inlet patch, otherwise normal exam.    Prior to Admission medications   Medication Sig Start Date End Date Taking? Authorizing Provider  AIMOVIG 140 MG/ML SOAJ as directed Subcutaneous monthly   Yes [provider]  aspirin EC 81 MG tablet Take 81 mg by mouth daily. Swallow whole.   Yes [provider]  atorvastatin (LIPITOR) 80 MG tablet Take 80 mg by mouth in the morning.   Yes [provider]  baclofen (LIORESAL) 10 MG tablet Take 10 mg by mouth 3 (three)  times daily.   Yes [provider]  busPIRone (BUSPAR) 15 MG tablet Take 15 mg by mouth 2 (two) times daily. 02/26/23  Yes [provider]  DULoxetine (CYMBALTA) 60 MG capsule Take 60 mg by mouth daily. 10/30/22  Yes [provider]  lisinopril (ZESTRIL) 5 MG tablet Take 5 mg by mouth in the morning.   Yes [provider]  magnesium 30 MG tablet Take 30 mg by mouth 2 (two) times daily.   Yes [provider]  metFORMIN (GLUCOPHAGE) 500 MG tablet Take 1 tablet (500 mg total) by mouth 2 (two) times daily with a meal. 05/20/23  Yes [name], DO  metoprolol succinate (TOPROL-XL) 25 MG 24 hr tablet Take 25 mg by mouth in the morning.   Yes [provider]  traZODone (DESYREL) 50 MG tablet Take 50 mg by mouth at bedtime.   Yes [provider]  UBRELVY 100 MG TABS Take by mouth. 12/08/22  Yes [provider]  cholecalciferol (VITAMIN D3) 25 MCG (1000 UNIT) tablet Take 1,000 Units by mouth daily.    [provider]  Liraglutide (VICTOZA [place]) Inject into the skin daily.    [provider]  Meloxicam 15 MG TBDP Take by mouth.    [provider]  Multiple Vitamin (MULTIVITAMIN WITH MINERALS) TABS tablet Take 1 tablet by  mouth in the morning.    [provider]  omeprazole (PRILOSEC) 40 MG capsule Take 1 capsule (40 mg total) by mouth 2 (two) times daily before a meal. 06/07/23   Letta Median, PA-C  oxyCODONE (OXY IR/ROXICODONE) 5 MG immediate release tablet Take 5 mg by mouth 2 (two) times daily.    [provider]  tiZANidine (ZANAFLEX) 2 MG tablet Take 1 tablet (2 mg total) by mouth 3 (three) times daily as needed for muscle spasms. 01/06/23   Drema Dallas, DO  vitamin C (ASCORBIC ACID) 500 MG tablet Take 500 mg by mouth in the morning.    [provider]    Allergies as of 05/31/2023   (No Known Allergies)    Family History  Problem Relation Age of Onset   AAA (abdominal  aortic aneurysm) Mother    Sudden death Mother    Alcoholism Mother    Dementia Father    Asthma Sister    Kidney cancer Sister        had kidney removed   Diabetes Sister    Lymphoma Nephew    Colon cancer Neg Hx     Social History   Socioeconomic History   Marital status: Significant Other    Spouse name: Not on file   Number of children: Not on file   Years of education: Not on file   Highest education level: Not on file  Occupational History   Not on file  Tobacco Use   Smoking status: Former    Current packs/day: 0.00    Types: Cigarettes    Quit date: 09/01/1999    Years since quitting: 23.8    Passive exposure: Never   Smokeless tobacco: Never   Tobacco comments:    smoked for approx 20 years   Vaping Use   Vaping status: Never Used  Substance and Sexual Activity   Alcohol use: Not Currently   Drug use: Yes    Types: Marijuana    Comment: occasionally-cbd   Sexual activity: Yes    Birth control/protection: Post-menopausal  Other Topics Concern   Not on file  Social History Narrative   Are you right handed or left handed? Right   Are you currently employed ? Retired   What is your current occupation?   Do you live at home alone? NO    Who lives with you?    What type of home do you live in: 1 story or 2 story? 1       Social Determinants of Health   Financial Resource Strain: Low Risk  (12/16/2022)   Overall Financial Resource Strain (CARDIA)    Difficulty of Paying Living Expenses: Not very hard  Food Insecurity: No Food Insecurity (12/16/2022)   Hunger Vital Sign    Worried About Running Out of Food in the Last Year: Never true    Ran Out of Food in the Last Year: Never true  Transportation Needs: No Transportation Needs (12/16/2022)   PRAPARE - Administrator, Civil Service (Medical): No    Lack of Transportation (Non-Medical): No  Physical Activity: Sufficiently Active (12/16/2022)   Exercise Vital Sign    Days of Exercise per Week: 3  days    Minutes of Exercise per Session: 60 min  Stress: Stress Concern Present (12/16/2022)   [employer] of Occupational Health - Occupational Stress Questionnaire    Feeling of Stress : Very much  Social Connections: Socially Integrated (12/16/2022)   Social Connection  and Isolation Panel [NHANES]    Frequency of Communication with Friends and Family: More than three times a week    Frequency of Social Gatherings with Friends and Family: Three times a week    Attends Religious Services: More than 4 times per year    Active Member of Clubs or Organizations: Yes    Attends Banker Meetings: More than 4 times per year    Marital Status: Living with partner  Intimate Partner Violence: Not At Risk (12/16/2022)   Humiliation, Afraid, Rape, and Kick questionnaire    Fear of Current or Ex-Partner: No    Emotionally Abused: No    Physically Abused: No    Sexually Abused: No    Review of Systems: General: Negative for fever, chills, fatigue, weakness. Eyes: Negative for vision changes.  ENT: Negative for hoarseness, difficulty swallowing , nasal congestion. CV: Negative for chest pain, angina, palpitations, dyspnea on exertion, peripheral edema.  Respiratory: Negative for dyspnea at rest, dyspnea on exertion, cough, sputum, wheezing.  GI: See history of present illness. GU:  Negative for dysuria, hematuria, urinary incontinence, urinary frequency, nocturnal urination.  MS: Negative for joint pain, low back pain.  Derm: Negative for rash or itching.  Neuro: Negative for weakness, abnormal sensation, seizure, frequent headaches, memory loss, confusion.  Psych: Negative for anxiety, depression Endo: Negative for unusual weight change.  Heme: Negative for bruising or bleeding. Allergy: Negative for rash or hives.  Physical Exam: Vital signs in last 24 hours: Temp:  [98.3 F (36.8 C)] 98.3 F (36.8 C) (11/04 1013) Pulse Rate:  [61] 61 (11/04 1013) Resp:  [12] 12 (11/04  1013) BP: (128)/(73) 128/73 (11/04 1013) SpO2:  [100 %] 100 % (11/04 1013) Weight:  [102.4 kg] 102.4 kg (11/04 1013)   General:   Alert,  Well-developed, well-nourished, pleasant and cooperative in NAD Head:  Normocephalic and atraumatic. Eyes:  Sclera clear, no icterus.   Conjunctiva pink. Ears:  Normal auditory acuity. Nose:  No deformity, discharge,  or lesions. Msk:  Symmetrical without gross deformities. Normal posture. Extremities:  Without clubbing or edema. Neurologic:  Alert and  oriented x4;  grossly normal neurologically. Skin:  Intact without significant lesions or rashes. Psych:  Alert and cooperative. Normal mood and affect.   Impression/Plan: Sarah Porter is here for an EGD with possible dilation due to history of dysphagia, GERD, abdominal pain  Risks, benefits, limitations, imponderables and alternatives regarding procedure have been reviewed with the patient. Questions have been answered. All parties agreeable.

## 2023-07-05 NOTE — Discharge Instructions (Signed)
EGD Discharge instructions Please read the instructions outlined below and refer to this sheet in the next few weeks. These discharge instructions provide you with general information on caring for yourself after you leave the hospital. Your doctor may also give you specific instructions. While your treatment has been planned according to the most current medical practices available, unavoidable complications occasionally occur. If you have any problems or questions after discharge, please call your doctor. ACTIVITY You may resume your regular activity but move at a slower pace for the next 24 hours.  Take frequent rest periods for the next 24 hours.  Walking will help expel (get rid of) the air and reduce the bloated feeling in your abdomen.  No driving for 24 hours (because of the anesthesia (medicine) used during the test).  You may shower.  Do not sign any important legal documents or operate any machinery for 24 hours (because of the anesthesia used during the test).  NUTRITION Drink plenty of fluids.  You may resume your normal diet.  Begin with a light meal and progress to your normal diet.  Avoid alcoholic beverages for 24 hours or as instructed by your caregiver.  MEDICATIONS You may resume your normal medications unless your caregiver tells you otherwise.  WHAT YOU CAN EXPECT TODAY You may experience abdominal discomfort such as a feeling of fullness or "gas" pains.  FOLLOW-UP Your doctor will discuss the results of your test with you.  SEEK IMMEDIATE MEDICAL ATTENTION IF ANY OF THE FOLLOWING OCCUR: Excessive nausea (feeling sick to your stomach) and/or vomiting.  Severe abdominal pain and distention (swelling).  Trouble swallowing.  Temperature over 101 F (37.8 C).  Rectal bleeding or vomiting of blood.   Your EGD revealed mild amount inflammation in your stomach.  I took biopsies of this to rule out infection with a bacteria called H. pylori. I also took samples of your  esophagus.  Await pathology results, my office will contact you.  Your esophagus was wide open.  I did not need to stretch it today.  Small bowel appeared normal.  Continue omeprazole twice daily.  Follow-up in GI office in 2 to 3 months.    I hope you have a great rest of your week!  Hennie Duos. Marletta Lor, D.O. Gastroenterology and Hepatology [facility]

## 2023-07-06 LAB — SURGICAL PATHOLOGY

## 2023-07-08 ENCOUNTER — Encounter (HOSPITAL_COMMUNITY): Payer: Self-pay | Admitting: Internal Medicine

## 2023-07-09 NOTE — Anesthesia Postprocedure Evaluation (Signed)
Anesthesia Post Note  Patient: Sarah Porter  Procedure(s) Performed: ESOPHAGOGASTRODUODENOSCOPY (EGD) WITH PROPOFOL BIOPSY  Patient location during evaluation: Phase II Anesthesia Type: General Level of consciousness: awake Pain management: pain level controlled Vital Signs Assessment: post-procedure vital signs reviewed and stable Respiratory status: spontaneous breathing and respiratory function stable Cardiovascular status: blood pressure returned to baseline and stable Postop Assessment: no headache and no apparent nausea or vomiting Anesthetic complications: no Comments: Late entry   No notable events documented.   Last Vitals:  Vitals:   07/05/23 1013 07/05/23 1155  BP: 128/73 96/75  Pulse: 61 71  Resp: 12 18  Temp: 36.8 C 36.6 C  SpO2: 100% 96%    Last Pain:  Vitals:   07/06/23 1413  TempSrc:   PainSc: 0-No pain                 Windell Norfolk

## 2023-07-13 DIAGNOSIS — M2550 Pain in unspecified joint: Secondary | ICD-10-CM | POA: Diagnosis not present

## 2023-07-13 DIAGNOSIS — G894 Chronic pain syndrome: Secondary | ICD-10-CM | POA: Diagnosis not present

## 2023-07-13 DIAGNOSIS — Z79891 Long term (current) use of opiate analgesic: Secondary | ICD-10-CM | POA: Diagnosis not present

## 2023-07-13 DIAGNOSIS — M150 Primary generalized (osteo)arthritis: Secondary | ICD-10-CM | POA: Diagnosis not present

## 2023-07-13 DIAGNOSIS — M797 Fibromyalgia: Secondary | ICD-10-CM | POA: Diagnosis not present

## 2023-07-15 DIAGNOSIS — F332 Major depressive disorder, recurrent severe without psychotic features: Secondary | ICD-10-CM | POA: Diagnosis not present

## 2023-07-28 ENCOUNTER — Ambulatory Visit: Payer: 59 | Admitting: Nurse Practitioner

## 2023-08-03 ENCOUNTER — Telehealth: Payer: Self-pay | Admitting: Cardiology

## 2023-08-03 NOTE — Telephone Encounter (Signed)
Pt c/o swelling: STAT is pt has developed SOB within 24 hours  How much weight have you gained and in what time span? Yes, in last 30 days  If swelling, where is the swelling located? Swelling in legs and feet. (Comes and goes)  Are you currently taking a fluid pill? no  Are you currently SOB? yes  Do you have a log of your daily weights (if so, list)?    Have you gained 3 pounds in a day or 5 pounds in a week? v  Have you traveled recently? Yes, [place]

## 2023-08-03 NOTE — Telephone Encounter (Signed)
Spoke with patient - states she has been having swelling in feet / legs x few weeks now.  Did contact her pcp & was given hydrochlorothiazide 12.5 every morning.  No c/o chest pain.  Does c/o SOB - seems to be more bothersome with exertion lately.  She questions if compression socks would be okay for her.  She has not been weighing or checking vitals at home.  States that she is currently in [place] and will not be back till after Christmas.  Suggested that she start weighing herself daily & given guidelines of 3lb in 1 day or 5lb in 1 week. Will send message to provider, but she may suggest urgent care since she is not in town currently.  Patient verbalized understanding.

## 2023-08-04 NOTE — Telephone Encounter (Signed)
Patient informed and verbalized understanding of plan. Patient will be back in town the beginning of January please schedule her for first avaliable with elizabeth in January

## 2023-08-04 NOTE — Telephone Encounter (Signed)
Pt is requesting a callback to f/u on the issue for calling yesterday. She stated she'd like to know what to do moving forward since she didn't get a callback yesterday advising her of anything after speaking with the nurse. Please advise

## 2023-08-10 DIAGNOSIS — R0602 Shortness of breath: Secondary | ICD-10-CM | POA: Diagnosis not present

## 2023-08-10 DIAGNOSIS — R079 Chest pain, unspecified: Secondary | ICD-10-CM | POA: Diagnosis not present

## 2023-08-10 DIAGNOSIS — Z87891 Personal history of nicotine dependence: Secondary | ICD-10-CM | POA: Diagnosis not present

## 2023-08-20 DIAGNOSIS — R0602 Shortness of breath: Secondary | ICD-10-CM | POA: Diagnosis not present

## 2023-08-20 DIAGNOSIS — M1990 Unspecified osteoarthritis, unspecified site: Secondary | ICD-10-CM | POA: Insufficient documentation

## 2023-09-07 DIAGNOSIS — F332 Major depressive disorder, recurrent severe without psychotic features: Secondary | ICD-10-CM | POA: Diagnosis not present

## 2023-09-09 ENCOUNTER — Encounter: Payer: Self-pay | Admitting: Internal Medicine

## 2023-09-09 ENCOUNTER — Emergency Department (HOSPITAL_COMMUNITY): Payer: 59

## 2023-09-09 ENCOUNTER — Encounter (HOSPITAL_COMMUNITY): Payer: Self-pay | Admitting: *Deleted

## 2023-09-09 ENCOUNTER — Ambulatory Visit: Payer: 59 | Attending: Nurse Practitioner | Admitting: Nurse Practitioner

## 2023-09-09 ENCOUNTER — Other Ambulatory Visit: Payer: Self-pay

## 2023-09-09 ENCOUNTER — Encounter: Payer: Self-pay | Admitting: Nurse Practitioner

## 2023-09-09 ENCOUNTER — Emergency Department (HOSPITAL_COMMUNITY)
Admission: EM | Admit: 2023-09-09 | Discharge: 2023-09-09 | Disposition: A | Discharge status: Home / Self Care | Payer: 59 | Attending: Emergency Medicine | Admitting: Emergency Medicine

## 2023-09-09 VITALS — BP 128/70 | HR 68 | Ht 61.0 in | Wt 237.0 lb

## 2023-09-09 DIAGNOSIS — Z7984 Long term (current) use of oral hypoglycemic drugs: Secondary | ICD-10-CM | POA: Diagnosis not present

## 2023-09-09 DIAGNOSIS — I10 Essential (primary) hypertension: Secondary | ICD-10-CM | POA: Diagnosis not present

## 2023-09-09 DIAGNOSIS — E669 Obesity, unspecified: Secondary | ICD-10-CM | POA: Diagnosis not present

## 2023-09-09 DIAGNOSIS — I509 Heart failure, unspecified: Secondary | ICD-10-CM | POA: Insufficient documentation

## 2023-09-09 DIAGNOSIS — E119 Type 2 diabetes mellitus without complications: Secondary | ICD-10-CM

## 2023-09-09 DIAGNOSIS — Z79899 Other long term (current) drug therapy: Secondary | ICD-10-CM | POA: Insufficient documentation

## 2023-09-09 DIAGNOSIS — R0602 Shortness of breath: Secondary | ICD-10-CM | POA: Diagnosis not present

## 2023-09-09 DIAGNOSIS — Z7982 Long term (current) use of aspirin: Secondary | ICD-10-CM | POA: Insufficient documentation

## 2023-09-09 DIAGNOSIS — F332 Major depressive disorder, recurrent severe without psychotic features: Secondary | ICD-10-CM | POA: Diagnosis not present

## 2023-09-09 DIAGNOSIS — R635 Abnormal weight gain: Secondary | ICD-10-CM

## 2023-09-09 DIAGNOSIS — R600 Localized edema: Secondary | ICD-10-CM

## 2023-09-09 DIAGNOSIS — I110 Hypertensive heart disease with heart failure: Secondary | ICD-10-CM | POA: Diagnosis not present

## 2023-09-09 DIAGNOSIS — E785 Hyperlipidemia, unspecified: Secondary | ICD-10-CM

## 2023-09-09 DIAGNOSIS — J069 Acute upper respiratory infection, unspecified: Secondary | ICD-10-CM | POA: Diagnosis not present

## 2023-09-09 LAB — COMPREHENSIVE METABOLIC PANEL
ALT: 29 U/L (ref 0–44)
AST: 27 U/L (ref 15–41)
Albumin: 3.8 g/dL (ref 3.5–5.0)
Alkaline Phosphatase: 82 U/L (ref 38–126)
Anion gap: 11 (ref 5–15)
BUN: 16 mg/dL (ref 8–23)
CO2: 24 mmol/L (ref 22–32)
Calcium: 9.2 mg/dL (ref 8.9–10.3)
Chloride: 102 mmol/L (ref 98–111)
Creatinine, Ser: 0.65 mg/dL (ref 0.44–1.00)
GFR, Estimated: >60 mL/min (ref >60)
Glucose, Bld: 75 mg/dL (ref 70–99)
Potassium: 3.7 mmol/L (ref 3.5–5.1)
Sodium: 137 mmol/L (ref 135–145)
Total Bilirubin: 0.9 mg/dL (ref 0.0–1.2)
Total Protein: 7 g/dL (ref 6.5–8.1)

## 2023-09-09 LAB — CBC
HCT: 39.2 % (ref 36.0–46.0)
Hemoglobin: 12.6 g/dL (ref 12.0–15.0)
MCH: 27.4 pg (ref 26.0–34.0)
MCHC: 32.1 g/dL (ref 30.0–36.0)
MCV: 85.2 fL (ref 80.0–100.0)
Platelets: 336 10*3/uL (ref 150–400)
RBC: 4.6 MIL/uL (ref 3.87–5.11)
RDW: 12.9 % (ref 11.5–15.5)
WBC: 8 10*3/uL (ref 4.0–10.5)
nRBC: 0 % (ref 0.0–0.2)

## 2023-09-09 LAB — BRAIN NATRIURETIC PEPTIDE: B Natriuretic Peptide: 134 pg/mL — ABNORMAL HIGH (ref 0.0–100.0)

## 2023-09-09 LAB — TROPONIN I (HIGH SENSITIVITY): Troponin I (High Sensitivity): 5 ng/L (ref <18)

## 2023-09-09 MED ORDER — FUROSEMIDE 20 MG PO TABS: 20.0000 mg | ORAL_TABLET | Freq: Every day | ORAL | 0 refills | Status: DC

## 2023-09-09 MED ORDER — FUROSEMIDE 10 MG/ML IJ SOLN
40.0000 mg | INTRAMUSCULAR | Status: AC
  Administered 2023-09-09: 40 mg via INTRAVENOUS
  Filled 2023-09-09: qty 4

## 2023-09-09 NOTE — ED Triage Notes (Signed)
 Pt with generalized swelling to legs and hands, c/o SOB with exertion.

## 2023-09-09 NOTE — Progress Notes (Addendum)
 Office Visit    Patient Name: Sarah Porter Date of Encounter: 09/09/2023 PCP:  Gerome Tillman CROME, FNP [facility]  Cardiologist:  Jayson Sierras, MD  Advanced Practice Provider:  No care team member to display Electrophysiologist:  None   Chief Complaint    Sarah Porter is a 63 y.o. female with a hx of atypical chest pain, palpitations, type 2 diabetes, hyperlipidemia, hypertension, vertigo, GERD, ADD, morbid obesity, and anxiety/depression, who presents today for peripheral edema evaluation.  Last seen for follow-up on May 25, 2023.  She was doing well, however but noticed difficulty with losing weight.  She continued to notice shortness of breath, attributes to her weight.  Denied any palpitations, syncope, presyncope, dizziness, orthopnea, PND, swelling or significant weight changes, acute bleeding, or claudication.  Admitted to random episodes of mid chest pain that radiated to her right jaw a couple times since last office visit, brief in duration, admitted to sensation of feeling like food gets caught after swallowing.  She was referred to GI for further evaluation.  She underwent EGD on July 05, 2023 that revealed a small hiatal hernia, mild reflux esophagitis, patchy mild inflammation characterized by erythema found in the gastric body and gastric antrum, biopsies were taken for H. pylori testing.  She contacted our office in early December 2024 noting leg edema.  Contacted her PCP regarding this and stated she was taking HCTZ 12.5 every morning.  She noted more bothersome DOE.  Due to the fact the patient was out of town at the time, it was recommended she follow-up with urgent care.  ED visit at the [facility] emergency department on August 10, 2023 for chest pain.  She noted improvement in her leg edema at that time, reported a vague chest pain associated with some pressure in her right arm.  Workup for ACS was negative.  BNP normal.  CTA of  chest was negative for PE and showed diffuse patchy groundglass opacities/mosaic attenuation, left more than right.  Infectious/inflammatory versus pulmonary edema versus air trapping.  Was diagnosed with viral pneumonia and discharged home.  Urgent care visit on August 10, 2023 and reported resolution of her chest pain, however continued to note shortness of breath with exertion along with new cold-like symptoms.  Continue to note leg edema bilaterally.  Had not taken Lasix  in the last several days.  Chest x-ray was negative.  She was empirically treated for bacterial pneumonia and discharged on antibiotics as well as prednisone.  Today she presents for scheduled follow-up.  She admits to progressive/worsening shortness of breath with exertion, denies any chest pain.  Admits to recent 8 pound weight gain in 1 day.  She also notes worsening leg edema, says her legs feel heavy.  She does endorse some salt intake, has been eating soup recently at this is all she can tolerate for p.o. intake at this time. Denies any chest pain, palpitations, syncope, presyncope, dizziness, orthopnea, PND, acute bleeding, or claudication.   EKGs/Labs/Other Studies Reviewed:   The following studies were reviewed today:   EKG:  EKG is not ordered today.    Myoview  05/2022:   Findings are consistent with no prior ischemia. The study is low risk.   No ST deviation was noted.   Left ventricular function is normal. End diastolic cavity size is normal. End systolic cavity size is normal.   Prior study not available for comparison.   IMPRESSIONS Negative for stress induced arrhythmias.  Stress ECG nondiagnostic due to  pharmacologic protocol. Normal left ventricular function and size. There is an anteroseptal perfusion defect in rest that that partial improves with stress and with normal wall motion.  Artifact is favored over infarct. There is diaphragmatic activity at rest that is not present at stress.    CONCLUSIONS No ischemia. Low risk study.   Cardiac monitor 02/2022: Zio XT reviewed. 6 days, 16 hours analyzed:   Predominant rhythm is sinus with prolonged PR interval. Heart rate ranged from 47 bpm up to 106 bpm with average heart rate 65 bpm. There were rare PACs including couplets and triplets representing less than 1% total beats. There were rare PVCs representing less than 1% total beats. Single, very brief episode of SVT noted lasting 6 beats. Very brief and rare dropped beats were noted (second degree type 1 or 2). No patient triggered events or diary entry associated with obvious arrhythmia.   Review of Systems    All other systems reviewed and are otherwise negative except as noted above.  Physical Exam    VS:  BP 128/70   Pulse 68   Ht 5' 1 (1.549 m)   Wt 237 lb (107.5 kg)   SpO2 98%   BMI 44.78 kg/m  , BMI Body mass index is 44.78 kg/m.  Wt Readings from Last 3 Encounters:  09/09/23 237 lb (107.5 kg)  07/05/23 225 lb 12 oz (102.4 kg)  07/01/23 225 lb 12 oz (102.4 kg)     GEN: Morbidly obese, 63 y.o. female, short of breath while speaking to me during interview HEENT: normal. Neck: Supple, no JVD, carotid bruits, or masses. Cardiac: S1/S2, RRR, no murmurs, rubs, or gallops. No clubbing, cyanosis, edema.  Radials/PT 2+ and equal bilaterally.  Respiratory:  Respirations fast  and labored, clear and diminished to auscultation bilaterally. MS: No deformity or atrophy. Skin: Warm and dry, no rash. Neuro:  Strength and sensation are intact. Psych: Normal affect.  Assessment & Plan    Shortness of breath Leg edema, weight gain HTN HLD T2DM Morbid obesity  Patient is a 63 year old female with past medical history of chest pain, palpitations, hypertension, hyperlipidemia, type 2 diabetes, hiatal hernia, morbid obesity, vertigo, GERD, shortness of breath with exertion, leg edema, weight gain, and ADD who presents today for scheduled follow-up.  Chief complaint  today is progressive/worsening shortness of breath on exertion, weight gain, and worsening leg edema.  On exam, she is short of breath while speaking to me during interview.  She also admits to recent 8 pound weight gain in 1 day.  Discussed treatment options regarding plan of care (outpatient vs inpatient), and stated that patient would benefit from being evaluated at [facility] emergency department today based on her presentation and will likely require IV diuresis.  Patient verbalized understanding and was agreeable to this. Offered/recommended EMS transportation and pt states she will drive herself to [facility] ED. Patient would benefit from TTE being obtained in the hospital. She will f/u with me or another provider in 1-2 weeks post discharge from the hospital. Gave report to [facility] ED physician on call who verbalized understanding.   Disposition: Follow up in 1-2 weeks with Jayson Sierras, MD or [facility].  Signed, Almarie Crate, NP 09/09/2023, 4:02 PM [facility]

## 2023-09-09 NOTE — ED Notes (Signed)
 Patient ambulated to restroom at this time.

## 2023-09-09 NOTE — Discharge Instructions (Signed)
 Follow up with cardiology next week. Talk to them about scheduling an echocardiogram. Take the lasix for one week. Return to the emergency department if you experience difficulty or any other concerning symptoms.

## 2023-09-09 NOTE — Patient Instructions (Addendum)
 Medication Instructions:  Your physician recommends that you continue on your current medications as directed. Please refer to the Current Medication list given to you today.  Labwork: None   Testing/Procedures: None   Follow-Up: Your physician recommends that you schedule a follow-up appointment in: 1-2 weeks after discharge   Any Other Special Instructions Will Be Listed Below (If Applicable).  If you need a refill on your cardiac medications before your next appointment, please call your pharmacy.

## 2023-09-09 NOTE — ED Provider Notes (Signed)
 [place] EMERGENCY DEPARTMENT AT [facility] Provider Note   CSN: [id number] Arrival date & time: 09/09/23  1615     History  Chief Complaint  Patient presents with   Shortness of Breath    Sarah Porter is a 63 y.o. female.  63 year old female with a history of hypertension, hyperlipidemia, diabetes, and hiatal hernia who presents emergency department with leg swelling and shortness of breath.  Patient reports that for the past month she has been having shortness of breath intermittently.  Was seen in [place]  on 08/10/2023 with similar symptoms.  She had a COVID and flu that were negative.  BNP that was WNL.  Troponin that was WNL.  Also had a CTA that showed some groundglass opacities without evidence of PE.  Says in the past 2 days she has been having shortness of breath again and decided to come into the emergency department for evaluation after her outpatient cardiologist said that she may need IV diuresis.       Home Medications Prior to Admission medications   Medication Sig Start Date End Date Taking? Authorizing Provider  AIMOVIG 140 MG/ML SOAJ Inject 140 mg into the skin every 30 (thirty) days.   Yes [provider]  albuterol  (VENTOLIN  HFA) 108 (90 Base) MCG/ACT inhaler Inhale 2 puffs into the lungs every 6 (six) hours as needed for wheezing or shortness of breath.   Yes [provider]  aspirin EC 81 MG tablet Take 81 mg by mouth daily. Swallow whole.   Yes [provider]  atorvastatin (LIPITOR) 80 MG tablet Take 80 mg by mouth in the morning.   Yes [provider]  baclofen  (LIORESAL ) 10 MG tablet Take 10 mg by mouth 2 (two) times daily as needed for muscle spasms.   Yes [provider]  busPIRone (BUSPAR) 15 MG tablet Take 15 mg by mouth 2 (two) times daily. 02/26/23  Yes [provider]  cholecalciferol (VITAMIN D3) 25 MCG (1000 UNIT) tablet Take 1,000 Units by mouth daily.   Yes [provider]  DULoxetine (CYMBALTA) 60 MG capsule Take 60 mg by mouth daily. 10/30/22  Yes [provider]  lisinopril (ZESTRIL) 5 MG tablet Take 5 mg by mouth in the morning.   Yes [provider]  magnesium  30 MG tablet Take 30 mg by mouth 2 (two) times daily.   Yes [provider]  Meloxicam 15 MG TBDP Take 1 tablet by mouth daily.   Yes [provider]  metFORMIN  (GLUCOPHAGE ) 500 MG tablet Take 1 tablet (500 mg total) by mouth 2 (two) times daily with a meal. Patient taking differently: Take 500 mg by mouth daily with breakfast. 05/20/23  Yes Delores Shields A, DO  metoprolol  succinate (TOPROL -XL) 25 MG 24 hr tablet Take 25 mg by mouth in the morning.   Yes [provider]  Multiple Vitamin (MULTIVITAMIN WITH MINERALS) TABS tablet Take 1 tablet by mouth in the morning.   Yes [provider]  omeprazole  (PRILOSEC) 40 MG capsule Take 1 capsule (40 mg total) by mouth 2 (two) times daily before a meal. Patient taking differently: Take 40 mg by mouth daily. 06/07/23  Yes Rudy Josette RAMAN, PA-C  Oxycodone  HCl 10 MG TABS Take 10 mg by mouth every 12 (twelve) hours as needed (pain).   Yes [provider]  traZODone (DESYREL) 50 MG tablet Take 50 mg by mouth at bedtime.   Yes [provider]  UBRELVY 100 MG TABS  Take 1 tablet by mouth daily as needed (migraine). 12/08/22  Yes [provider]  vitamin C (ASCORBIC ACID) 500 MG tablet Take 500 mg by mouth in the morning.   Yes [provider]  amoxicillin-clavulanate (AUGMENTIN) 875-125 MG tablet Take 1 tablet by mouth 2 (two) times daily.    [provider]  azithromycin (ZITHROMAX) 250 MG tablet Take 250 mg by mouth daily.    [provider]  furosemide  (LASIX ) 20 MG tablet Take 1 tablet (20 mg total) by mouth daily. 09/09/23   Yolande Lamar BROCKS, MD  losartan (COZAAR) 100 MG tablet Take 100 mg by mouth daily. 09/09/23   [provider]  predniSONE  (DELTASONE) 10 MG tablet Take 20 mg by mouth 2 (two) times daily with a meal.    [provider]      Allergies    Patient has no known allergies.    Review of Systems   Review of Systems  Physical Exam Updated Vital Signs BP 128/75   Pulse (!) 59   Temp 98.6 F (37 C) (Oral)   Resp 12   Ht 5' 1 (1.549 m)   Wt 107 kg   SpO2 97%   BMI 44.59 kg/m  Physical Exam Vitals and nursing note reviewed.  Constitutional:      General: She is not in acute distress.    Appearance: She is well-developed.  HENT:     Head: Normocephalic and atraumatic.     Right Ear: External ear normal.     Left Ear: External ear normal.     Nose: Nose normal.  Eyes:     Extraocular Movements: Extraocular movements intact.     Conjunctiva/sclera: Conjunctivae normal.     Pupils: Pupils are equal, round, and reactive to light.  Cardiovascular:     Rate and Rhythm: Normal rate and regular rhythm.     Heart sounds: No murmur heard. Pulmonary:     Effort: Pulmonary effort is normal. No respiratory distress.     Breath sounds: Normal breath sounds.  Musculoskeletal:     Cervical back: Normal range of motion and neck supple.     Right lower leg: Edema present.     Left lower leg: Edema present.  Skin:    General: Skin is warm and dry.  Neurological:     Mental Status: She is alert and oriented to person, place, and time. Mental status is at baseline.  Psychiatric:        Mood and Affect: Mood normal.     ED Results / Procedures / Treatments   Labs (all labs ordered are listed, but only abnormal results are displayed) Labs Reviewed  BRAIN NATRIURETIC PEPTIDE - Abnormal; Notable for the following components:      Result Value   B Natriuretic Peptide 134.0 (*)    All other components within normal limits  COMPREHENSIVE METABOLIC PANEL  CBC  TROPONIN I (HIGH SENSITIVITY)    EKG None  Radiology DG Chest 2 View Result Date: 09/09/2023 CLINICAL DATA:  Shortness of breath EXAM:  CHEST - 2 VIEW COMPARISON:  None Available. FINDINGS: No consolidation, pneumothorax or effusion. No edema. Normal cardiopericardial silhouette. Overlapping cardiac leads. Degenerative changes along the spine. IMPRESSION: No acute cardiopulmonary disease. Electronically Signed   By: Ranell Bring M.D.   On: 09/09/2023 18:26    Procedures Procedures    Medications Ordered in ED Medications  furosemide  (LASIX ) injection 40 mg (40 mg Intravenous Given 09/09/23 1926)  ED Course/ Medical Decision Making/ A&P Clinical Course as of 09/10/23 1817  Thu Sep 09, 2023  1739 Dr Raford from cardiology consulted.  Recommends IV diuresis.  Feels that patient's workup could be performed as an outpatient. [RP]    Clinical Course User Index [RP] Yolande Lamar BROCKS, MD                                 Medical Decision Making Amount and/or Complexity of Data Reviewed Labs: ordered. Radiology: ordered.  Risk Prescription drug management.   Britini Garcilazo is a 63 y.o. female with comorbidities that complicate the patient evaluation including hypertension, hyperlipidemia, diabetes, and hiatal hernia who presents emergency department with leg swelling and shortness of breath.    Initial Ddx:  Heart failure exacerbation, CKD, liver disease, MI, PE, pneumonia, URI  MDM/Course:  Patient presents to the emergency department with shortness of breath and bilateral lower extremity swelling.  She was sent in from cardiology clinic due to suspected heart failure.  However, several weeks ago was already seen in [place]  and had a BNP that was WNL as well as a CTA that showed some groundglass opacities but no PE or significant pulmonary edema.  She also had serial troponins that were WNL.  On exam she is overall very well-appearing does have some mild lower extremity edema.  Initial troponin was 5.  Do not feel that repeat is warranted since she does not have any active chest pain and feel that MI is highly  unlikely in terms of the cause of her symptoms.  BNP was again marginally elevated 134 today.  Chest x-ray without pulmonary edema.  Since she just had a CTA for the symptoms do not feel that repeat is warranted to evaluate for PE.  Upon re-evaluation patient was stable.  Given some Lasix  IV for diuresis.  Given prescription of Lasix  as well.  Discussed with the on-call cardiologist and they will have her follow-up as an outpatient for continued evaluation and echo.  This patient presents to the ED for concern of complaints listed in HPI, this involves an extensive number of treatment options, and is a complaint that carries with it a high risk of complications and morbidity. Disposition including potential need for admission considered.   Dispo: DC Home. Return precautions discussed including, but not limited to, those listed in the AVS. Allowed pt time to ask questions which were answered fully prior to dc.  Additional history obtained from spouse Records reviewed Outpatient Clinic Notes The following labs were independently interpreted: Chemistry and show no acute abnormality I independently reviewed the following imaging with scope of interpretation limited to determining acute life threatening conditions related to emergency care: Chest x-ray and agree with the radiologist interpretation with the following exceptions: none I personally reviewed and interpreted cardiac monitoring: normal sinus rhythm  I personally reviewed and interpreted the pt's EKG: see above for interpretation  I have reviewed the patients home medications and made adjustments as needed Consults: Cardiology  Portions of this note were generated with Dragon dictation software. Dictation errors may occur despite best attempts at proofreading.     Final Clinical Impression(s) / ED Diagnoses Final diagnoses:  Congestive heart failure, unspecified HF chronicity, unspecified heart failure type (HCC)  Lower extremity edema   SOB (shortness of breath)    Rx / DC Orders ED Discharge Orders          Ordered  furosemide  (LASIX ) 20 MG tablet  Daily,   Status:  Discontinued        09/09/23 1952    furosemide  (LASIX ) 20 MG tablet  Daily        09/09/23 1952    Ambulatory referral to Cardiology        09/09/23 1954              Yolande Lamar BROCKS, MD 09/10/23 [phone]

## 2023-09-10 ENCOUNTER — Other Ambulatory Visit: Payer: Self-pay | Admitting: Cardiology

## 2023-09-10 DIAGNOSIS — R0602 Shortness of breath: Secondary | ICD-10-CM

## 2023-09-10 DIAGNOSIS — M25472 Effusion, left ankle: Secondary | ICD-10-CM

## 2023-09-15 ENCOUNTER — Telehealth: Payer: Self-pay | Admitting: Cardiology

## 2023-09-15 ENCOUNTER — Encounter: Payer: Self-pay | Admitting: Internal Medicine

## 2023-09-15 DIAGNOSIS — R0602 Shortness of breath: Secondary | ICD-10-CM

## 2023-09-15 DIAGNOSIS — Z79899 Other long term (current) drug therapy: Secondary | ICD-10-CM

## 2023-09-15 NOTE — Telephone Encounter (Signed)
 Pt c/o Shortness Of Breath: STAT if SOB developed within the last 24 hours or pt is noticeably SOB on the phone  1. Are you currently SOB (can you hear that pt is SOB on the phone)?   Yes  2. How long have you been experiencing SOB?   Since back in November  3. Are you SOB when sitting or when up moving around?  Both sitting and upon exertion  4. Are you currently experiencing any other symptoms?   Nauseous    Patient stated she was put on Lasix  and has been doing better and the swelling has gone down.  Patient is concerned she is still having SOB and feels nauseous.

## 2023-09-16 NOTE — Telephone Encounter (Signed)
Reports swelling is better but she still has SOB especially exertion. Reports SOB is about the same as it was at last visit and ED visit on 09/09/2023. Reports weight has decreased and today weighed 225 lbs. Reports eating a small 4 oz cup of pudding and felt like she had eaten a full meal. Reports a little chest pain rated 8/10 on yesterday in middle of chest that lasted 45 minutes. Denies active chest pain. Denies dizziness. Denies cough, congestion, fever, n/v or diarrhea. Reports she is currently taking furosemide 20 mg daily that was prescribed by the ED doctor. Reports she had these same symptoms while in [place] late last year and she was given prednisone that improved her symptoms. Last home BP check was was Tuesday 09/14/2023 and was 121/79. Says she is unable to check her oxygen level. Advised to contact GI provider regarding early satiety. Currently scheduled for echocardiogram on 09/22/2023 to evaluate SOB. Advised to contact PCP and GI doctor for non-cardiac causes of symptoms. Advised to keep echo appointment next week and if she develops worsening symptoms, to go to the Ed for an evaluation. Verbalized understanding of plan.

## 2023-09-21 MED ORDER — FUROSEMIDE 20 MG PO TABS: 20.0000 mg | ORAL_TABLET | Freq: Every day | ORAL | Status: DC

## 2023-09-21 NOTE — Addendum Note (Signed)
Addended by: Eustace Moore on: 09/21/2023 04:22 PM   Modules accepted: Orders

## 2023-09-21 NOTE — Telephone Encounter (Addendum)
Patient informed and verbalized understanding of plan. Request to pick up copy of lab order at [facility] office tomorrow when she comes for Echo appointment.  Will have lab work done at [facility]

## 2023-09-22 ENCOUNTER — Encounter: Payer: Self-pay | Admitting: Neurology

## 2023-09-22 ENCOUNTER — Ambulatory Visit: Payer: 59 | Attending: Nurse Practitioner

## 2023-09-22 DIAGNOSIS — M25471 Effusion, right ankle: Secondary | ICD-10-CM | POA: Diagnosis not present

## 2023-09-22 DIAGNOSIS — M25472 Effusion, left ankle: Secondary | ICD-10-CM

## 2023-09-22 DIAGNOSIS — R0602 Shortness of breath: Secondary | ICD-10-CM

## 2023-09-22 LAB — ECHOCARDIOGRAM COMPLETE
AR max vel: 2.83 cm2
AV Area VTI: 3 cm2
AV Area mean vel: 2.65 cm2
AV Mean grad: 4 mm[Hg]
AV Peak grad: 9.1 mm[Hg]
Ao pk vel: 1.51 m/s
Area-P 1/2: 3.91 cm2
Calc EF: 64.5 %
MV VTI: 2.42 cm2
S' Lateral: 2.8 cm
Single Plane A2C EF: 66.9 %
Single Plane A4C EF: 61.5 %

## 2023-09-23 ENCOUNTER — Ambulatory Visit: Payer: 59 | Admitting: Internal Medicine

## 2023-09-23 ENCOUNTER — Encounter: Payer: Self-pay | Admitting: Internal Medicine

## 2023-09-23 VITALS — BP 130/72 | HR 67 | Temp 97.4°F | Ht 61.0 in | Wt 234.0 lb

## 2023-09-23 DIAGNOSIS — K5903 Drug induced constipation: Secondary | ICD-10-CM

## 2023-09-23 DIAGNOSIS — K2270 Barrett's esophagus without dysplasia: Secondary | ICD-10-CM

## 2023-09-23 DIAGNOSIS — R1013 Epigastric pain: Secondary | ICD-10-CM

## 2023-09-23 DIAGNOSIS — K5900 Constipation, unspecified: Secondary | ICD-10-CM

## 2023-09-23 DIAGNOSIS — K2100 Gastro-esophageal reflux disease with esophagitis, without bleeding: Secondary | ICD-10-CM | POA: Diagnosis not present

## 2023-09-23 DIAGNOSIS — R0600 Dyspnea, unspecified: Secondary | ICD-10-CM

## 2023-09-23 MED ORDER — PANTOPRAZOLE SODIUM 40 MG PO TBEC: 40.0000 mg | DELAYED_RELEASE_TABLET | Freq: Two times a day (BID) | ORAL | 11 refills | Status: DC

## 2023-09-23 NOTE — Progress Notes (Signed)
Referring Provider: Wilmon Pali, FNP Primary Care Physician:  Wilmon Pali, FNP Primary GI:  Dr. Marletta Lor  Chief Complaint  Patient presents with   Follow-up    HPI:   Sarah Porter is a 63 y.o. female who presents to clinic today for follow-up visit.  Last seen in our office 05/21/2023.  Chronic GERD: Currently taking omeprazole 40 mg twice daily.  Symptoms relatively well-controlled though does note increased abdominal bloating, occasional breakthrough heartburn.  No dysphagia odynophagia.  EGD 07/05/2023 with mild reflux esophagitis, short segment Barrett's esophagus (biopsies confirmatory), gastritis (negative for H. pylori), normal duodenum.  Constipation: Worsening since starting daily oxycodone approximately 2 months ago.  Will go multiple days without bowel movements.  Notes associated abdominal pain and bloating.  Not taking anything currently for this.  Past Medical History:  Diagnosis Date   Anemia    Anxiety    Back pain    Chest pain    Constipation    Depression    Essential hypertension    Fibromyalgia    GERD (gastroesophageal reflux disease)    Hyperlipidemia    Joint pain    Osteoarthritis    Palpitations    Sleep apnea    SOB (shortness of breath)    Swelling of lower extremity    Type 2 diabetes mellitus (HCC)    Vertigo     Past Surgical History:  Procedure Laterality Date   BILATERAL KNEE REPAIRS     BIOPSY  07/05/2023   Procedure: BIOPSY;  Surgeon: Lanelle Bal, DO;  Location: [facility];  Service: Endoscopy;;   CESAREAN SECTION     x 2   COLONOSCOPY  11/04/2011   Dr. Edrick Kins; diminutive colon polyp in the rectum, otherwise normal exam.  Pathology with tubular adenoma.  Recommended 5-year repeat.   COLONOSCOPY  07/12/2014   Dr. Edrick Kins; submucosal hemorrhages in proximal descending colon?  Prior ischemic or infectious colitis?,  Otherwise normal exam.  Recommended 5-year repeat.   COLONOSCOPY WITH PROPOFOL N/A  07/22/2021   Procedure: COLONOSCOPY WITH PROPOFOL;  Surgeon: Lanelle Bal, DO;  Location: [facility];  Service: Endoscopy;  Laterality: N/A;  8:45am   ESOPHAGOGASTRODUODENOSCOPY  07/21/2004   Dr. Edrick Kins; grade 2 erosive esophagitis at EG junction, cervical inlet patch, otherwise normal exam.   ESOPHAGOGASTRODUODENOSCOPY (EGD) WITH PROPOFOL N/A 07/05/2023   Procedure: ESOPHAGOGASTRODUODENOSCOPY (EGD) WITH PROPOFOL;  Surgeon: Lanelle Bal, DO;  Location: [facility];  Service: Endoscopy;  Laterality: N/A;  11:15 am, asa 3    Current Outpatient Medications  Medication Sig Dispense Refill   AIMOVIG 140 MG/ML SOAJ Inject 140 mg into the skin every 30 (thirty) days.     aspirin EC 81 MG tablet Take 81 mg by mouth daily. Swallow whole.     atorvastatin (LIPITOR) 80 MG tablet Take 80 mg by mouth in the morning.     baclofen (LIORESAL) 10 MG tablet Take 10 mg by mouth 2 (two) times daily as needed for muscle spasms.     busPIRone (BUSPAR) 15 MG tablet Take 15 mg by mouth 2 (two) times daily.     cholecalciferol (VITAMIN D3) 25 MCG (1000 UNIT) tablet Take 1,000 Units by mouth daily.     DULoxetine (CYMBALTA) 60 MG capsule Take 60 mg by mouth daily.     furosemide (LASIX) 20 MG tablet Take 1 tablet (20 mg total) by mouth daily. May take an extra tablet of 20 mg of Lasix  daily for worsening shortness of breath, weight gain, or increased leg swelling.     losartan (COZAAR) 100 MG tablet Take 100 mg by mouth daily.     magnesium 30 MG tablet Take 30 mg by mouth 2 (two) times daily.     Meloxicam 15 MG TBDP Take 1 tablet by mouth daily.     Multiple Vitamin (MULTIVITAMIN WITH MINERALS) TABS tablet Take 1 tablet by mouth in the morning.     Oxycodone HCl 10 MG TABS Take 10 mg by mouth every 12 (twelve) hours as needed (pain).     pantoprazole (PROTONIX) 40 MG tablet Take 1 tablet (40 mg total) by mouth 2 (two) times daily. 60 tablet 11   traZODone (DESYREL) 50 MG tablet Take 50 mg by  mouth at bedtime.     UBRELVY 100 MG TABS Take 1 tablet by mouth daily as needed (migraine).     vitamin C (ASCORBIC ACID) 500 MG tablet Take 500 mg by mouth in the morning.     Continuous Glucose Receiver (DEXCOM G7 RECEIVER) DEVI  (Patient not taking: Reported on 09/23/2023)     Continuous Glucose Sensor (DEXCOM G7 SENSOR) MISC  (Patient not taking: Reported on 09/23/2023)     TRULICITY 0.75 MG/0.5ML SOAJ Inject into the skin. (Patient not taking: Reported on 09/23/2023)     No current facility-administered medications for this visit.    Allergies as of 09/23/2023   (No Known Allergies)    Family History  Problem Relation Age of Onset   AAA (abdominal aortic aneurysm) Mother    Sudden death Mother    Alcoholism Mother    Dementia Father    Asthma Sister    Kidney cancer Sister        had kidney removed   Diabetes Sister    Lymphoma Nephew    Colon cancer Neg Hx     Social History   Socioeconomic History   Marital status: Significant Other    Spouse name: Not on file   Number of children: Not on file   Years of education: Not on file   Highest education level: Not on file  Occupational History   Not on file  Tobacco Use   Smoking status: Former    Current packs/day: 0.00    Types: Cigarettes    Quit date: 09/01/1999    Years since quitting: 24.0    Passive exposure: Never   Smokeless tobacco: Never   Tobacco comments:    smoked for approx 20 years   Vaping Use   Vaping status: Never Used  Substance and Sexual Activity   Alcohol use: Not Currently   Drug use: Yes    Types: Marijuana    Comment: occasionally-cbd   Sexual activity: Yes    Birth control/protection: Post-menopausal  Other Topics Concern   Not on file  Social History Narrative   Are you right handed or left handed? Right   Are you currently employed ? Retired   What is your current occupation?   Do you live at home alone? NO    Who lives with you?    What type of home do you live in: 1 story or  2 story? 1       Social Drivers of Corporate investment banker Strain: Low Risk  (12/16/2022)   Overall Financial Resource Strain (CARDIA)    Difficulty of Paying Living Expenses: Not very hard  Food Insecurity: No Food Insecurity (12/16/2022)   Hunger Vital Sign  Worried About Programme researcher, broadcasting/film/video in the Last Year: Never true    Ran Out of Food in the Last Year: Never true  Transportation Needs: No Transportation Needs (12/16/2022)   PRAPARE - Administrator, Civil Service (Medical): No    Lack of Transportation (Non-Medical): No  Physical Activity: Sufficiently Active (12/16/2022)   Exercise Vital Sign    Days of Exercise per Week: 3 days    Minutes of Exercise per Session: 60 min  Stress: Stress Concern Present (12/16/2022)   [employer] of Occupational Health - Occupational Stress Questionnaire    Feeling of Stress : Very much  Social Connections: Socially Integrated (12/16/2022)   Social Connection and Isolation Panel [NHANES]    Frequency of Communication with Friends and Family: More than three times a week    Frequency of Social Gatherings with Friends and Family: Three times a week    Attends Religious Services: More than 4 times per year    Active Member of Clubs or Organizations: Yes    Attends Banker Meetings: More than 4 times per year    Marital Status: Living with partner    Subjective: Review of Systems  Constitutional:  Negative for chills and fever.  HENT:  Negative for congestion and hearing loss.   Eyes:  Negative for blurred vision and double vision.  Respiratory:  Negative for cough and shortness of breath.   Cardiovascular:  Negative for chest pain and palpitations.  Gastrointestinal:  Negative for abdominal pain, blood in stool, constipation, diarrhea, heartburn, melena and vomiting.  Genitourinary:  Negative for dysuria and urgency.  Musculoskeletal:  Negative for joint pain and myalgias.  Skin:  Negative for itching and  rash.  Neurological:  Negative for dizziness and headaches.  Psychiatric/Behavioral:  Negative for depression. The patient is not nervous/anxious.      Objective: BP 130/72 (BP Location: Right Arm, Patient Position: Sitting, Cuff Size: Large)   Pulse 67   Temp (!) 97.4 F (36.3 C) (Oral)   Ht 5\' 1"  (1.549 m)   Wt 234 lb (106.1 kg)   SpO2 99%   BMI 44.21 kg/m  Physical Exam Constitutional:      Appearance: Normal appearance.  HENT:     Head: Normocephalic and atraumatic.  Eyes:     Extraocular Movements: Extraocular movements intact.     Conjunctiva/sclera: Conjunctivae normal.  Cardiovascular:     Rate and Rhythm: Normal rate and regular rhythm.  Pulmonary:     Effort: Pulmonary effort is normal.     Breath sounds: Normal breath sounds.  Abdominal:     General: Bowel sounds are normal.     Palpations: Abdomen is soft.  Musculoskeletal:        General: No swelling. Normal range of motion.     Cervical back: Normal range of motion and neck supple.  Skin:    General: Skin is warm and dry.     Coloration: Skin is not jaundiced.  Neurological:     General: No focal deficit present.     Mental Status: She is alert and oriented to person, place, and time.  Psychiatric:        Mood and Affect: Mood normal.        Behavior: Behavior normal.      Assessment/Plan:  1.  Chronic GERD, Barrett's esophagus-symptoms not ideally controlled.  Will change to pantoprazole 40 mg twice daily.  Sent to pharmacy.  Surveillance EGD 2029.  2.  Constipation, abdominal  bloating-likely worse in the setting of opioid use x 2 months.  I recommended taking MiraLAX 1 capful daily.  If this is not adequate then increase to 2 capfuls daily.  If this is still not adequate then add on once daily Dulcolax.  If symptoms not improved, could consider trial of Linzess or Movantik.  3.  Dyspnea-currently being worked up by cardiology.  May benefit from pulmonary referral if cardiac workup  unremarkable.  Follow-up in 6 weeks    09/23/2023 2:01 PM   Disclaimer: This note was dictated with voice recognition software. Similar sounding words can inadvertently be transcribed and may not be corrected upon review.

## 2023-09-23 NOTE — Patient Instructions (Signed)
For your chronic acid reflux, I am going to change your omeprazole to pantoprazole 40 mg twice daily.  I have sent this to your pharmacy.  For your constipation, I want you to start taking over the counter MiraLAX 1 capful daily.  If this does not adequately control your constipation, I would increase to 2 capfuls daily.  If this is still not adequate, then I would add on once daily Dulcolax (bisacodyl) tablet.   If symptoms not improved, let me know and we can trial on prescription medication for constipation.  Follow-up in 4 to 6 weeks.  It was very nice seeing both you today.  Dr. Marletta Lor

## 2023-09-27 ENCOUNTER — Telehealth: Payer: Self-pay | Admitting: Internal Medicine

## 2023-09-27 ENCOUNTER — Telehealth: Payer: Self-pay | Admitting: Cardiology

## 2023-09-27 DIAGNOSIS — M150 Primary generalized (osteo)arthritis: Secondary | ICD-10-CM | POA: Diagnosis not present

## 2023-09-27 DIAGNOSIS — G894 Chronic pain syndrome: Secondary | ICD-10-CM | POA: Diagnosis not present

## 2023-09-27 DIAGNOSIS — Z79891 Long term (current) use of opiate analgesic: Secondary | ICD-10-CM | POA: Diagnosis not present

## 2023-09-27 DIAGNOSIS — M2550 Pain in unspecified joint: Secondary | ICD-10-CM | POA: Diagnosis not present

## 2023-09-27 DIAGNOSIS — M797 Fibromyalgia: Secondary | ICD-10-CM | POA: Diagnosis not present

## 2023-09-27 NOTE — Telephone Encounter (Signed)
Patient informed and verbalized understanding of plan.

## 2023-09-27 NOTE — Telephone Encounter (Signed)
Pt was seen by Dr Marletta Lor on Sep 23, 2023 and has a follow up scheduled to come back. She LMOM today that she is still having problems and wants another OV with Dr Marletta Lor. Please advise. [phone]

## 2023-09-27 NOTE — Telephone Encounter (Signed)
Pt's pantoprazole has been approved and the pt is aware of this. She advises she will pick up today or tomorrow.

## 2023-09-27 NOTE — Telephone Encounter (Signed)
Pt is requesting a callback regarding her results. Please advise

## 2023-09-27 NOTE — Telephone Encounter (Signed)
PA done for the pt on Cover My Meds for Pantoprazole.  Tried and failed: Omeprazole 20 and 40mg . Dx used: GERD (K21.9) and K22.70 . Waiting on a response from Cover My Meds.   Phoned and spoke with the pt, she has been notified that we just done a PA for the Pantoprazole and it should hopefully calm down the GERD and bloating. Pt states she will wait to see if medication is approved.

## 2023-09-28 ENCOUNTER — Other Ambulatory Visit: Payer: Self-pay | Admitting: Nurse Practitioner

## 2023-09-28 DIAGNOSIS — R0602 Shortness of breath: Secondary | ICD-10-CM

## 2023-09-28 DIAGNOSIS — E782 Mixed hyperlipidemia: Secondary | ICD-10-CM

## 2023-09-28 MED ORDER — EZETIMIBE 10 MG PO TABS: 10.0000 mg | ORAL_TABLET | Freq: Every day | ORAL | 1 refills | Status: DC

## 2023-09-28 NOTE — Telephone Encounter (Signed)
Given to Darl Pikes to be scanned to pt's chart

## 2023-09-29 ENCOUNTER — Telehealth: Payer: Self-pay | Admitting: Cardiology

## 2023-09-29 MED ORDER — FUROSEMIDE 20 MG PO TABS: 20.0000 mg | ORAL_TABLET | Freq: Every day | ORAL | 3 refills | Status: DC

## 2023-09-29 NOTE — Telephone Encounter (Signed)
Patient states that you gain 4lbs since yesterday and states she still is sob.

## 2023-09-29 NOTE — Telephone Encounter (Signed)
Reports gaining 4 lbs over night and continue having SOB. Reports SOB is unchanged and is about the same as before. Denies dizziness or chest pain. Reports weights have been checked with consistency using the same scale, same time, and same amount of clothes after voiding. Reports she is unsure of her baseline weight but thinks its around 225 lbs. See weights below:  09/25/2023 226 lbs 09/26/2023 226 lbs 09/27/2023 didn't check 09/28/2023 225 lbs  09/29/2023 229 lbs  Reports she didn't drink extra fluids nor have any added salt to diet yesterday.  Confirmed that she has been taking furosemide 20 mg daily. Advised that per directions given on 09/21/2023, she can take an extra 20 mg tablet daily as needed for increase SOB, weight gain or leg swelling. Advised that a new prescription would be sent to her pharmacy to reflect these instructions. Advised to contact our office if her weight doesn't decrease or SOB doesn't improve or if she has to use the extra 20 mg lasix frequently. Verbalized understanding of plan.

## 2023-09-30 NOTE — Telephone Encounter (Signed)
Mychart message sent informing of Sarah Porter's response.

## 2023-10-01 ENCOUNTER — Telehealth: Payer: Self-pay | Admitting: Cardiology

## 2023-10-01 DIAGNOSIS — Z79899 Other long term (current) drug therapy: Secondary | ICD-10-CM

## 2023-10-01 DIAGNOSIS — R635 Abnormal weight gain: Secondary | ICD-10-CM

## 2023-10-01 DIAGNOSIS — R0602 Shortness of breath: Secondary | ICD-10-CM

## 2023-10-01 DIAGNOSIS — I10 Essential (primary) hypertension: Secondary | ICD-10-CM

## 2023-10-01 MED ORDER — POTASSIUM CHLORIDE CRYS ER 20 MEQ PO TBCR: 20.0000 meq | EXTENDED_RELEASE_TABLET | Freq: Every day | ORAL | 1 refills | Status: DC

## 2023-10-01 MED ORDER — FUROSEMIDE 40 MG PO TABS: 40.0000 mg | ORAL_TABLET | Freq: Two times a day (BID) | ORAL | 3 refills | Status: DC

## 2023-10-01 NOTE — Telephone Encounter (Signed)
Patient informed and verbalized understanding of plan. °Lab orders faxed to [facility] °

## 2023-10-01 NOTE — Telephone Encounter (Signed)
Pt c/o swelling/edema: STAT if pt has developed SOB within 24 hours  If swelling, where is the swelling located? Ankles and feet  How much weight have you gained and in what time span?  3 lbs weight gain overnight   Have you gained 2 pounds in a day or 5 pounds in a week? Yes  Do you have a log of your daily weights (if so, list)?  09/30/23 227 lbs 10/01/23 230 lbs  Are you currently taking a fluid pill? Yes  Are you currently SOB? Yes, but has been an ongoing symptom  Have you traveled recently in a car or plane for an extended period of time? Not for about a month   Was told to call if she gains 3 lbs within a day.

## 2023-10-07 ENCOUNTER — Encounter (HOSPITAL_COMMUNITY): Payer: Self-pay

## 2023-10-07 ENCOUNTER — Emergency Department (HOSPITAL_COMMUNITY): Payer: 59

## 2023-10-07 ENCOUNTER — Other Ambulatory Visit: Payer: Self-pay

## 2023-10-07 ENCOUNTER — Emergency Department (HOSPITAL_COMMUNITY)
Admission: EM | Admit: 2023-10-07 | Discharge: 2023-10-07 | Disposition: A | Discharge status: Home / Self Care | Payer: 59 | Attending: Emergency Medicine | Admitting: Emergency Medicine

## 2023-10-07 DIAGNOSIS — R1011 Right upper quadrant pain: Secondary | ICD-10-CM | POA: Diagnosis not present

## 2023-10-07 DIAGNOSIS — I509 Heart failure, unspecified: Secondary | ICD-10-CM | POA: Insufficient documentation

## 2023-10-07 DIAGNOSIS — Z794 Long term (current) use of insulin: Secondary | ICD-10-CM | POA: Insufficient documentation

## 2023-10-07 DIAGNOSIS — Z20822 Contact with and (suspected) exposure to covid-19: Secondary | ICD-10-CM | POA: Insufficient documentation

## 2023-10-07 DIAGNOSIS — Z7982 Long term (current) use of aspirin: Secondary | ICD-10-CM | POA: Diagnosis not present

## 2023-10-07 DIAGNOSIS — R1010 Upper abdominal pain, unspecified: Secondary | ICD-10-CM | POA: Insufficient documentation

## 2023-10-07 DIAGNOSIS — N200 Calculus of kidney: Secondary | ICD-10-CM | POA: Diagnosis not present

## 2023-10-07 DIAGNOSIS — R109 Unspecified abdominal pain: Secondary | ICD-10-CM | POA: Diagnosis not present

## 2023-10-07 DIAGNOSIS — R0602 Shortness of breath: Secondary | ICD-10-CM | POA: Diagnosis not present

## 2023-10-07 HISTORY — DX: Heart failure, unspecified: I50.9

## 2023-10-07 LAB — CBC WITH DIFFERENTIAL/PLATELET
Abs Immature Granulocytes: 0.04 10*3/uL (ref 0.00–0.07)
Basophils Absolute: 0 10*3/uL (ref 0.0–0.1)
Basophils Relative: 0 %
Eosinophils Absolute: 0.3 10*3/uL (ref 0.0–0.5)
Eosinophils Relative: 3 %
HCT: 46.1 % — ABNORMAL HIGH (ref 36.0–46.0)
Hemoglobin: 15.2 g/dL — ABNORMAL HIGH (ref 12.0–15.0)
Immature Granulocytes: 0 %
Lymphocytes Relative: 16 %
Lymphs Abs: 1.6 10*3/uL (ref 0.7–4.0)
MCH: 28.1 pg (ref 26.0–34.0)
MCHC: 33 g/dL (ref 30.0–36.0)
MCV: 85.2 fL (ref 80.0–100.0)
Monocytes Absolute: 0.8 10*3/uL (ref 0.1–1.0)
Monocytes Relative: 9 %
Neutro Abs: 6.8 10*3/uL (ref 1.7–7.7)
Neutrophils Relative %: 72 %
Platelets: 376 10*3/uL (ref 150–400)
RBC: 5.41 MIL/uL — ABNORMAL HIGH (ref 3.87–5.11)
RDW: 12.9 % (ref 11.5–15.5)
WBC: 9.5 10*3/uL (ref 4.0–10.5)
nRBC: 0 % (ref 0.0–0.2)

## 2023-10-07 LAB — COMPREHENSIVE METABOLIC PANEL
ALT: 31 U/L (ref 0–44)
AST: 29 U/L (ref 15–41)
Albumin: 4.3 g/dL (ref 3.5–5.0)
Alkaline Phosphatase: 101 U/L (ref 38–126)
Anion gap: 12 (ref 5–15)
BUN: 25 mg/dL — ABNORMAL HIGH (ref 8–23)
CO2: 28 mmol/L (ref 22–32)
Calcium: 9.8 mg/dL (ref 8.9–10.3)
Chloride: 96 mmol/L — ABNORMAL LOW (ref 98–111)
Creatinine, Ser: 0.91 mg/dL (ref 0.44–1.00)
GFR, Estimated: >60 mL/min (ref >60)
Glucose, Bld: 127 mg/dL — ABNORMAL HIGH (ref 70–99)
Potassium: 4 mmol/L (ref 3.5–5.1)
Sodium: 136 mmol/L (ref 135–145)
Total Bilirubin: 0.9 mg/dL (ref 0.0–1.2)
Total Protein: 7.8 g/dL (ref 6.5–8.1)

## 2023-10-07 LAB — RESP PANEL BY RT-PCR (RSV, FLU A&B, COVID)  RVPGX2
Influenza A by PCR: NEGATIVE
Influenza B by PCR: NEGATIVE
Resp Syncytial Virus by PCR: NEGATIVE
SARS Coronavirus 2 by RT PCR: NEGATIVE

## 2023-10-07 LAB — BRAIN NATRIURETIC PEPTIDE: B Natriuretic Peptide: 15 pg/mL (ref 0.0–100.0)

## 2023-10-07 LAB — TROPONIN I (HIGH SENSITIVITY)
Troponin I (High Sensitivity): 4 ng/L (ref <18)
Troponin I (High Sensitivity): 3 ng/L (ref <18)

## 2023-10-07 LAB — LIPASE, BLOOD: Lipase: 25 U/L (ref 11–51)

## 2023-10-07 MED ORDER — IOHEXOL 300 MG/ML  SOLN
100.0000 mL | Freq: Once | INTRAMUSCULAR | Status: AC | PRN
  Administered 2023-10-07: 100 mL via INTRAVENOUS

## 2023-10-07 MED ORDER — ALBUTEROL SULFATE HFA 108 (90 BASE) MCG/ACT IN AERS
2.0000 | INHALATION_SPRAY | RESPIRATORY_TRACT | Status: DC | PRN
  Administered 2023-10-07: 2 via RESPIRATORY_TRACT
  Filled 2023-10-07 (×2): qty 6.7

## 2023-10-07 NOTE — Discharge Instructions (Signed)
 If you develop worsening, continued, or recurrent abdominal pain, uncontrolled vomiting, fever, chest or back pain, or any other new/concerning symptoms then return to the ER for evaluation.

## 2023-10-07 NOTE — ED Provider Notes (Signed)
  EMERGENCY DEPARTMENT AT [facility] Provider Note   CSN: [id number] Arrival date & time: 10/07/23  1031     History  Chief Complaint  Patient presents with   Shortness of Breath    Sarah Porter is a 63 y.o. female.  HPI 62 year old female presents with concern for CHF/shortness of breath.  She has been dealing with shortness of breath waxing and waning since November 2024.  She was in [place]  at the time.  She has been on and off albuterol  inhalers and been on and off to doctors.  Over the last couple weeks was started on Lasix  due to concern for CHF and has been on 40 mg which was ultimately increased to 40 mg twice daily.  When she calls cardiology she will occasionally have increased weight gain and will be told to take an 80 mg dose.  Right now she feels like she has gained 3 pounds since yesterday though has no current leg swelling.  This whole time since November she has been having waxing and waning abdominal pain and distention and feels full quickly.  No current chest pain.  The shortness of breath is pretty much constant though does worsen with exertion.  No fevers or new cough or sore throat.  Home Medications Prior to Admission medications   Medication Sig Start Date End Date Taking? Authorizing Provider  busPIRone (BUSPAR) 10 MG tablet Take 10 mg by mouth 3 (three) times daily. 08/15/23  Yes [provider]  lisinopril (ZESTRIL) 5 MG tablet Take 5 mg by mouth daily. 10/02/23  Yes [provider]  meloxicam (MOBIC) 15 MG tablet Take 15 mg by mouth daily. 08/08/23  Yes [provider]  AIMOVIG 140 MG/ML SOAJ Inject 140 mg into the skin every 30 (thirty) days.    [provider]  aspirin EC 81 MG tablet Take 81 mg by mouth daily. Swallow whole.    [provider]  atorvastatin (LIPITOR) 80 MG tablet Take 80 mg by mouth in the morning.    [provider]  baclofen  (LIORESAL ) 10 MG tablet Take 10 mg by mouth 2  (two) times daily as needed for muscle spasms.    [provider]  cholecalciferol (VITAMIN D3) 25 MCG (1000 UNIT) tablet Take 1,000 Units by mouth daily.    [provider]  Continuous Glucose Receiver (DEXCOM G7 RECEIVER) DEVI  09/22/23   [provider]  Continuous Glucose Sensor (DEXCOM G7 SENSOR) MISC  09/22/23   [provider]  DULoxetine (CYMBALTA) 60 MG capsule Take 60 mg by mouth daily. 10/30/22   [provider]  ezetimibe  (ZETIA ) 10 MG tablet Take 1 tablet (10 mg total) by mouth daily. 09/28/23 12/27/23  Miriam Norris, NP  furosemide  (LASIX ) 40 MG tablet Take 1 tablet (40 mg total) by mouth 2 (two) times daily. For 4 days, then reduce to 40 mg daily 10/01/23   Miriam Norris, NP  losartan (COZAAR) 100 MG tablet Take 100 mg by mouth daily. 09/09/23   [provider]  magnesium  30 MG tablet Take 30 mg by mouth 2 (two) times daily.    [provider]  Multiple Vitamin (MULTIVITAMIN WITH MINERALS) TABS tablet Take 1 tablet by mouth in the morning.    [provider]  Oxycodone  HCl 10 MG TABS Take 10 mg by mouth every 12 (twelve) hours as needed (pain).    [provider]  pantoprazole  (PROTONIX ) 40 MG tablet Take 1 tablet (40 mg  total) by mouth 2 (two) times daily. 09/23/23 09/22/24  Cindie Carlin POUR, DO  potassium chloride  SA (KLOR-CON  M) 20 MEQ tablet Take 1 tablet (20 mEq total) by mouth daily. 10/01/23   Miriam Norris, NP  traZODone (DESYREL) 50 MG tablet Take 50 mg by mouth at bedtime.    [provider]  TRULICITY  0.75 MG/0.5ML SOAJ Inject into the skin. Patient not taking: Reported on 09/23/2023 09/22/23   [provider]  UBRELVY 100 MG TABS Take 1 tablet by mouth daily as needed (migraine). 12/08/22   [provider]  vitamin C (ASCORBIC ACID) 500 MG tablet Take 500 mg by mouth in the morning.    [provider]      Allergies    Patient has no known allergies.    Review  of Systems   Review of Systems  Constitutional:  Negative for fever.  Respiratory:  Positive for cough and shortness of breath.   Cardiovascular:  Positive for leg swelling. Negative for chest pain.  Gastrointestinal:  Positive for abdominal distention and abdominal pain.    Physical Exam Updated Vital Signs BP 128/72   Pulse 78   Temp 97.7 F (36.5 C) (Oral)   Resp 18   Ht 5' 1 (1.549 m)   Wt 101.2 kg   SpO2 93%   BMI 42.14 kg/m  Physical Exam Vitals and nursing note reviewed.  Constitutional:      General: She is not in acute distress.    Appearance: She is well-developed. She is not ill-appearing or diaphoretic.  HENT:     Head: Normocephalic and atraumatic.  Cardiovascular:     Rate and Rhythm: Normal rate and regular rhythm.     Heart sounds: Normal heart sounds.  Pulmonary:     Effort: Pulmonary effort is normal.     Breath sounds: Normal breath sounds. No wheezing, rhonchi or rales.  Abdominal:     Palpations: Abdomen is soft.     Tenderness: There is abdominal tenderness in the right upper quadrant, epigastric area and left upper quadrant.  Musculoskeletal:     Right lower leg: No edema.     Left lower leg: No edema.  Skin:    General: Skin is warm and dry.  Neurological:     Mental Status: She is alert.     ED Results / Procedures / Treatments   Labs (all labs ordered are listed, but only abnormal results are displayed) Labs Reviewed  CBC WITH DIFFERENTIAL/PLATELET - Abnormal; Notable for the following components:      Result Value   RBC 5.41 (*)    Hemoglobin 15.2 (*)    HCT 46.1 (*)    All other components within normal limits  COMPREHENSIVE METABOLIC PANEL - Abnormal; Notable for the following components:   Chloride 96 (*)    Glucose, Bld 127 (*)    BUN 25 (*)    All other components within normal limits  RESP PANEL BY RT-PCR (RSV, FLU A&B, COVID)  RVPGX2  BRAIN NATRIURETIC PEPTIDE  LIPASE, BLOOD  TROPONIN I (HIGH SENSITIVITY)  TROPONIN I  (HIGH SENSITIVITY)    EKG EKG Interpretation Date/Time:  Thursday October 07 2023 11:35:24 EST Ventricular Rate:  80 PR Interval:  194 QRS Duration:  82 QT Interval:  366 QTC Calculation: 422 R Axis:   80  Text Interpretation: Normal sinus rhythm Low voltage QRS Borderline ECG  non significant change compared to jan 2025 Confirmed by Freddi Hamilton [phone]) on 10/07/2023 12:08:03 PM  Radiology CT ABDOMEN PELVIS W CONTRAST Result Date: 10/07/2023 CLINICAL DATA:  Acute nonlocalized abdominal pain and worsening shortness of breath. EXAM: CT ABDOMEN AND PELVIS WITH CONTRAST TECHNIQUE: Multidetector CT imaging of the abdomen and pelvis was performed using the standard protocol following bolus administration of intravenous contrast. RADIATION DOSE REDUCTION: This exam was performed according to the departmental dose-optimization program which includes automated exposure control, adjustment of the mA and/or kV according to patient size and/or use of iterative reconstruction technique. CONTRAST:  OMNIPAQUE  IOHEXOL  300 MG/ML  SOLN COMPARISON:  Ultrasound 06/02/2023 FINDINGS: Lower chest: No acute abnormality. Hepatobiliary: Unremarkable liver. Normal gallbladder. No biliary dilation. Pancreas: Unremarkable. Spleen: Unremarkable. Adrenals/Urinary Tract: Normal adrenal glands. Nonobstructing left nephrolithiasis. No hydronephrosis bladder is unremarkable. Stomach/Bowel: Normal caliber large and small bowel. No bowel wall thickening. The appendix is normal.Stomach is within normal limits. Vascular/Lymphatic: No significant vascular findings are present. No enlarged abdominal or pelvic lymph nodes. Reproductive: Unremarkable. Other: No free intraperitoneal fluid or air. Musculoskeletal: No acute fracture. IMPRESSION: 1. No acute abnormality in the abdomen or pelvis. 2. Nonobstructing left nephrolithiasis. Electronically Signed   By: Norman Gatlin M.D.   On: 10/07/2023 13:33   DG Chest 2 View Result  Date: 10/07/2023 CLINICAL DATA:  Shortness of breath EXAM: CHEST - 2 VIEW COMPARISON:  09/09/2023 FINDINGS: Heart and mediastinal contours are within normal limits. No focal opacities or effusions. No acute bony abnormality. IMPRESSION: No active cardiopulmonary disease. Electronically Signed   By: Franky Crease M.D.   On: 10/07/2023 12:28    Procedures Procedures    Medications Ordered in ED Medications  albuterol  (VENTOLIN  HFA) 108 (90 Base) MCG/ACT inhaler 2 puff (has no administration in time range)  iohexol  (OMNIPAQUE ) 300 MG/ML solution 100 mL (100 mLs Intravenous Contrast Given 10/07/23 1254)    ED Course/ Medical Decision Making/ A&P                                 Medical Decision Making Amount and/or Complexity of Data Reviewed Labs: ordered.    Details: Normal troponin x 2 Normal BNP Radiology: ordered and independent interpretation performed.    Details: No CHF, no bowel obstruction ECG/medicine tests: ordered and independent interpretation performed.    Details: No ischemia  Risk Prescription drug management.   Patient presents with shortness of breath and upper abdominal discomfort.  Sounds like this has been going on since November and she has seen cardiology and GI and had an EGD and diagnosed with a small hiatal hernia that was not thought to be the cause.  Seems like symptoms wax and wane.  My clinical exam does not suggest this is CHF.  Workup including CT is unremarkable.  I did discuss the benign nephrolithiasis this not causing symptoms now.  She is not hypoxic or wheezing.  Low suspicion for PE given length of time of symptoms.  At this point, no clear cause and it seems like this has been ongoing for 3 months, will have her follow-up with her outpatient specialist and PCP.  Given return precautions.        Final Clinical Impression(s) / ED Diagnoses Final diagnoses:  Shortness of breath  Upper abdominal pain    Rx / DC Orders ED Discharge Orders      None         Freddi Hamilton, MD 10/07/23 [phone]

## 2023-10-07 NOTE — ED Triage Notes (Signed)
 Pt arrived via POV c/o worsening SOB. Pt reports her Lasix  has recently been adjusted. Pt reports dyspnea at rest. Pt has Hx of CHF.

## 2023-10-08 ENCOUNTER — Telehealth: Payer: Self-pay | Admitting: Cardiology

## 2023-10-08 NOTE — Telephone Encounter (Signed)
 Patients calling in to see if she needs to have labs done prior to appt. Please advise

## 2023-10-08 NOTE — Telephone Encounter (Signed)
 Advised patient to have labs done before upcoming office visit

## 2023-10-11 ENCOUNTER — Telehealth: Payer: Self-pay

## 2023-10-11 NOTE — Telephone Encounter (Signed)
 Returned  the pt's call and was advised by her that she is still having the same issues with her abd as she was when she came in. She is taking Miralax just once a day but stopped X 2 days ago. She  is taking her pantoprazole  X 2 a day but nothing is helping. She is having some problems that she feels full after eating. She  states she is having some breathing issues which she is being seen for that tomorrow. Please advise

## 2023-10-12 ENCOUNTER — Ambulatory Visit: Payer: 59 | Attending: Nurse Practitioner | Admitting: Nurse Practitioner

## 2023-10-12 ENCOUNTER — Telehealth: Payer: Self-pay | Admitting: Nurse Practitioner

## 2023-10-12 VITALS — BP 120/82 | HR 64 | Ht 61.0 in | Wt 228.2 lb

## 2023-10-12 DIAGNOSIS — E7849 Other hyperlipidemia: Secondary | ICD-10-CM | POA: Diagnosis not present

## 2023-10-12 DIAGNOSIS — Z79899 Other long term (current) drug therapy: Secondary | ICD-10-CM

## 2023-10-12 DIAGNOSIS — F419 Anxiety disorder, unspecified: Secondary | ICD-10-CM | POA: Diagnosis not present

## 2023-10-12 DIAGNOSIS — K219 Gastro-esophageal reflux disease without esophagitis: Secondary | ICD-10-CM

## 2023-10-12 DIAGNOSIS — I10 Essential (primary) hypertension: Secondary | ICD-10-CM | POA: Diagnosis not present

## 2023-10-12 DIAGNOSIS — G4700 Insomnia, unspecified: Secondary | ICD-10-CM | POA: Diagnosis not present

## 2023-10-12 DIAGNOSIS — I5030 Unspecified diastolic (congestive) heart failure: Secondary | ICD-10-CM

## 2023-10-12 DIAGNOSIS — I509 Heart failure, unspecified: Secondary | ICD-10-CM | POA: Diagnosis not present

## 2023-10-12 DIAGNOSIS — R1013 Epigastric pain: Secondary | ICD-10-CM | POA: Diagnosis not present

## 2023-10-12 DIAGNOSIS — R0602 Shortness of breath: Secondary | ICD-10-CM | POA: Diagnosis not present

## 2023-10-12 DIAGNOSIS — R635 Abnormal weight gain: Secondary | ICD-10-CM | POA: Diagnosis not present

## 2023-10-12 LAB — CBC
Hematocrit: 45.7 % (ref 34.0–46.6)
Hemoglobin: 14.4 g/dL (ref 11.1–15.9)
MCH: 27 pg (ref 26.6–33.0)
MCHC: 31.5 g/dL (ref 31.5–35.7)
MCV: 86 fL (ref 79–97)
Platelets: 373 10*3/uL (ref 150–450)
RBC: 5.34 x10E6/uL — ABNORMAL HIGH (ref 3.77–5.28)
RDW: 12.4 % (ref 11.7–15.4)
WBC: 6.8 10*3/uL (ref 3.4–10.8)

## 2023-10-12 LAB — COMPREHENSIVE METABOLIC PANEL
ALT: 29 [IU]/L (ref 0–32)
AST: 31 [IU]/L (ref 0–40)
Albumin: 4.6 g/dL (ref 3.9–4.9)
Alkaline Phosphatase: 124 [IU]/L — ABNORMAL HIGH (ref 44–121)
BUN/Creatinine Ratio: 12 (ref 12–28)
BUN: 10 mg/dL (ref 8–27)
Bilirubin Total: 0.5 mg/dL (ref 0.0–1.2)
CO2: 25 mmol/L (ref 20–29)
Calcium: 9.9 mg/dL (ref 8.7–10.3)
Chloride: 100 mmol/L (ref 96–106)
Creatinine, Ser: 0.83 mg/dL (ref 0.57–1.00)
Globulin, Total: 2.4 g/dL (ref 1.5–4.5)
Glucose: 118 mg/dL — ABNORMAL HIGH (ref 70–99)
Potassium: 4.9 mmol/L (ref 3.5–5.2)
Sodium: 140 mmol/L (ref 134–144)
Total Protein: 7 g/dL (ref 6.0–8.5)
eGFR: 79 mL/min/{1.73_m2} (ref >59)

## 2023-10-12 LAB — BRAIN NATRIURETIC PEPTIDE: BNP: 16.6 pg/mL (ref 0.0–100.0)

## 2023-10-12 LAB — MAGNESIUM: Magnesium: 2 mg/dL (ref 1.6–2.3)

## 2023-10-12 NOTE — Progress Notes (Addendum)
Office Visit    Patient Name: Sarah Porter Date of Encounter: 10/12/2023 PCP:  [facility]  Cardiologist:  [name], MD  Advanced Practice Provider:  No care team member to display Electrophysiologist:  None   Chief Complaint    Sarah Porter is a 63 y.o. female with a hx of atypical chest pain, palpitations, type 2 diabetes, hyperlipidemia, hypertension, vertigo, GERD, ADD, morbid obesity, and anxiety/depression, who presents today for peripheral edema evaluation.  Last seen for follow-up on May 25, 2023.  She was doing well, however but noticed difficulty with losing weight.  She continued to notice shortness of breath, attributes to her weight.  Denied any palpitations, syncope, presyncope, dizziness, orthopnea, PND, swelling or significant weight changes, acute bleeding, or claudication.  Admitted to random episodes of mid chest pain that radiated to her right jaw a couple times since last office visit, brief in duration, admitted to sensation of feeling like food gets caught after swallowing.  She was referred to GI for further evaluation.  She underwent EGD on July 05, 2023 that revealed a small hiatal hernia, mild reflux esophagitis, patchy mild inflammation characterized by erythema found in the gastric body and gastric antrum, biopsies were taken for H. pylori testing.  She contacted our office in early December 2024 noting leg edema.  Contacted her PCP regarding this and stated she was taking HCTZ 12.5 every morning.  She noted more bothersome DOE.  Due to the fact the patient was out of town at the time, it was recommended she follow-up with urgent care.  ED visit at the [facility] emergency department on August 10, 2023 for chest pain.  She noted improvement in her leg edema at that time, reported a vague chest pain associated with some pressure in her right arm.  Workup for ACS was negative.  BNP normal.   CTA of chest was negative for PE and showed diffuse patchy groundglass opacities/mosaic attenuation, left more than right.  Infectious/inflammatory versus pulmonary edema versus air trapping.  Was diagnosed with viral pneumonia and discharged home.  Urgent care visit on August 10, 2023 and reported resolution of her chest pain, however continued to note shortness of breath with exertion along with new cold-like symptoms.  Continue to note leg edema bilaterally.  Had not taken Lasix in the last several days.  Chest x-ray was negative.  She was empirically treated for bacterial pneumonia and discharged on antibiotics as well as prednisone.  09/09/2023 - Today she presents for scheduled follow-up.  She admits to progressive/worsening shortness of breath with exertion, denies any chest pain.  Admits to recent 8 pound weight gain in 1 day.  She also notes worsening leg edema, says her legs feel heavy.  She does endorse some salt intake, has been eating soup recently at this is all she can tolerate for p.o. intake at this time. Denies any chest pain, palpitations, syncope, presyncope, dizziness, orthopnea, PND, acute bleeding, or claudication.  ED visit on 09/09/2023 at [facility] for suspected CHF and shortness of breath.  BNP 134.  Troponins negative.  She was given IV Lasix based on her symptoms, rest of blood work was within normal limits.  Two-view chest x-ray was negative for anything acute.   Returned to the ED at [facility] on October 07, 2023 for shortness of breath, had been waxing and waning since November 2024.  She noted a 3 pound weight gain, did not have any current leg  swelling at that time.  She also reported intermittent abdominal pain and distention, feeling full quickly.  BNP normal.  Blood work was WNL and unremarkable.  CT of abdomen showed nothing acute, nonobstructing left nephrolithiasis noted.  She tested negative for COVID-19, flu, and RSV.  10/12/2023 - Today she presents to the office  for follow-up with her husband.  Patient is upset and tearful during visit and frustrated because she says she is not getting any better.  Continues to note shortness of breath, and currently taking Lasix 40 mg daily.  She says her leg swelling has gotten better since I last saw her.  Per our records/scales, her weight is down almost 10 pounds since I last saw her.  Husband states she is not getting adequate sleep and up till 4:00 in the morning.  He also states she is not taking her medication at a consistent time every day.  Patient admits to what sounds like RLS of her entire body at nighttime as well as orthopnea and says she has to sleep on the couch in order to relieve her shortness of breath.  Continues to admit to epigastric pain as well as feeling full/distended.  She has been in contact with her GI doctor recently and told to increase her reflux medication.  Denies any chest pain, palpitations, syncope, presyncope, dizziness, PND, acute bleeding, or claudication.  Says she had a sleep study performed not long ago that was negative for OSA.  Per review of medications, her PCP prescribed losartan in addition to lisinopril.    EKGs/Labs/Other Studies Reviewed:   The following studies were reviewed today:   EKG:  EKG is not ordered today.    Echo 09/22/2023:  1. Left ventricular ejection fraction, by estimation, is 65 to 70%. The  left ventricle has normal function. The left ventricle has no regional  wall motion abnormalities. Left ventricular diastolic parameters were  normal. The average left ventricular  global longitudinal strain is -22.8 %. The global longitudinal strain is  normal.   2. Right ventricular systolic function is normal. The right ventricular  size is normal.   3. The mitral valve is normal in structure. No evidence of mitral valve  regurgitation. No evidence of mitral stenosis.   4. The aortic valve is tricuspid. Aortic valve regurgitation is not  visualized. No aortic  stenosis is present.   5. The inferior vena cava is normal in size with greater than 50%  respiratory variability, suggesting right atrial pressure of 3 mmHg.   Comparison(s): No prior Echocardiogram.  Myoview 05/2022:   Findings are consistent with no prior ischemia. The study is low risk.   No ST deviation was noted.   Left ventricular function is normal. End diastolic cavity size is normal. End systolic cavity size is normal.   Prior study not available for comparison.   IMPRESSIONS Negative for stress induced arrhythmias.  Stress ECG nondiagnostic due to pharmacologic protocol. Normal left ventricular function and size. There is an anteroseptal perfusion defect in rest that that partial improves with stress and with normal wall motion.  Artifact is favored over infarct. There is diaphragmatic activity at rest that is not present at stress.   CONCLUSIONS No ischemia. Low risk study.   Cardiac monitor 02/2022: Zio XT reviewed. 6 days, 16 hours analyzed:   Predominant rhythm is sinus with prolonged PR interval. Heart rate ranged from 47 bpm up to 106 bpm with average heart rate 65 bpm. There were rare PACs including couplets and  triplets representing less than 1% total beats. There were rare PVCs representing less than 1% total beats. Single, very brief episode of SVT noted lasting 6 beats. Very brief and rare dropped beats were noted (second degree type 1 or 2). No patient triggered events or diary entry associated with obvious arrhythmia.   Review of Systems    All other systems reviewed and are otherwise negative except as noted above.  Physical Exam    VS:  BP 120/82   Pulse 64   Ht 5\' 1"  (1.549 m)   Wt 228 lb 3.2 oz (103.5 kg)   SpO2 98%   BMI 43.12 kg/m  , BMI Body mass index is 43.12 kg/m.  Wt Readings from Last 3 Encounters:  10/12/23 228 lb 3.2 oz (103.5 kg)  10/07/23 223 lb (101.2 kg)  09/23/23 234 lb (106.1 kg)     GEN: Morbidly obese, 63 y.o. female,  short of breath while speaking to me during interview, tearful at times and appears anxious HEENT: normal. Neck: Supple, no JVD, carotid bruits, or masses. Cardiac: S1/S2, RRR, no murmurs, rubs, or gallops. No clubbing, cyanosis, edema.  Radials/PT 2+ and equal bilaterally.  Respiratory:  Respirations fast  and labored, clear and diminished to auscultation bilaterally. MS: No deformity or atrophy. Skin: Warm and dry, no rash. Neuro:  Strength and sensation are intact. Psych: Normal affect.  Assessment & Plan    HFpEF, Shortness of breath, medication management Stage C, NYHA class III-IV symptoms. EF 65-70%. Recent ED visit and was r/o for anything acute. She was instructed to f/u with her outpatient specialist and PCP. Euvolemic and well compensated on exam. Per our records, weight is down almost 10 lbs from last office visit although admits to labile weights. Instructed her to stop Lisinopril and will get STAT labs today including: CBC, CMET, proBNP, Thyroid panel, and Magnesium level.  If labs are stable, plan to increase Lasix and potassium dosage.  Will also arrange PFT's. Past CTA was negative for PE with negative D-dimer. She denies any CP, recent trops negative. Last NST was low risk. No other medication changes at this time. Low sodium diet, fluid restriction <2L, and daily weights encouraged. Educated to contact our office for weight gain of 2 lbs overnight or 5 lbs in one week.  Care and ED precautions discussed.   Addendum 10/12/2023: Lab work that was obtained today came back and reveals CMP overall unremarkable, alkaline phosphatase slightly elevated at 124.  CBC and magnesium overall look good.  Thyroid panel could not be obtained stat.  Will increase Lasix to 40 mg twice daily from 40 mg daily as well as increase potassium to 20 mEq twice daily and repeat BMET in 1-2 weeks. Continue to follow with PCP.   HTN BP is well controlled. Discussed to monitor BP at home at least 2 hours after  medications and sitting for 5-10 minutes.  Instructed her to stop lisinopril and continue losartan.  Getting stat labs today-see above.  No other medication changes at this time. Heart healthy diet and regular cardiovascular exercise encouraged.   GERD, abdominal pain Etiology unclear.  Recent CT scan of abdomen was negative for anything acute, showed nonobstructing left nephrolithiasis.  Recommended continue follow-up with GI specialist and will route note to care team.  Care and ED precautions discussed.  Anxiety, insomnia Patient is anxious during office visit today, denies any red flag signs or symptoms.  She is also tearful and husband admits that pt experiences insomnia.  Support given. Past sleep study was negative for sleep apnea.  She says trazodone does not help her sleep.  Recommended follow-up with PCP to discuss other treatment options for her symptoms. Will check labs as mentioned above.   Disposition: Follow up in 2-3 weeks with [name], MD or APP.  Signed, Sharlene Dory, NP 10/12/2023, 9:44 AM [facility]

## 2023-10-12 NOTE — Patient Instructions (Addendum)
Medication Instructions:  Your physician has recommended you make the following change in your medication:  Please stop taking Lisinopril   Labwork: STAT today   Testing/Procedures: Your physician has recommended that you have a pulmonary function test. Pulmonary Function Tests are a group of tests that measure how well air moves in and out of your lungs.  Follow-Up: Your physician recommends that you schedule a follow-up appointment in: 2-3 weeks   Any Other Special Instructions Will Be Listed Below (If Applicable).  If you need a refill on your cardiac medications before your next appointment, please call your pharmacy.

## 2023-10-12 NOTE — Telephone Encounter (Signed)
Checking percert on the following patient for testing   Complete PFT's    ( 10/13/2023) at [facility]

## 2023-10-13 ENCOUNTER — Ambulatory Visit (HOSPITAL_COMMUNITY)
Admission: RE | Admit: 2023-10-13 | Discharge: 2023-10-13 | Disposition: A | Discharge status: Home / Self Care | Payer: 59 | Source: Ambulatory Visit | Attending: Nurse Practitioner | Admitting: Nurse Practitioner

## 2023-10-13 DIAGNOSIS — I5030 Unspecified diastolic (congestive) heart failure: Secondary | ICD-10-CM

## 2023-10-13 DIAGNOSIS — R0602 Shortness of breath: Secondary | ICD-10-CM | POA: Diagnosis not present

## 2023-10-13 DIAGNOSIS — Z79899 Other long term (current) drug therapy: Secondary | ICD-10-CM

## 2023-10-13 LAB — PULMONARY FUNCTION TEST
DL/VA % pred: 87 %
DL/VA: 3.75 ml/min/mmHg/L
DLCO cor % pred: 92 %
DLCO cor: 16.64 ml/min/mmHg
DLCO unc % pred: 95 %
DLCO unc: 17.13 ml/min/mmHg
FEF 25-75 Post: 3.82 L/s
FEF 25-75 Pre: 2.07 L/s
FEF2575-%Change-Post: 84 %
FEF2575-%Pred-Post: 186 %
FEF2575-%Pred-Pre: 101 %
FEV1-%Change-Post: 18 %
FEV1-%Pred-Post: 125 %
FEV1-%Pred-Pre: 105 %
FEV1-Post: 2.75 L
FEV1-Pre: 2.32 L
FEV1FVC-%Change-Post: 9 %
FEV1FVC-%Pred-Pre: 99 %
FEV6-%Change-Post: 8 %
FEV6-%Pred-Post: 117 %
FEV6-%Pred-Pre: 108 %
FEV6-Post: 3.24 L
FEV6-Pre: 3 L
FEV6FVC-%Change-Post: 0 %
FEV6FVC-%Pred-Post: 104 %
FEV6FVC-%Pred-Pre: 103 %
FVC-%Change-Post: 8 %
FVC-%Pred-Post: 113 %
FVC-%Pred-Pre: 104 %
FVC-Post: 3.24 L
FVC-Pre: 3 L
Post FEV1/FVC ratio: 85 %
Post FEV6/FVC ratio: 100 %
Pre FEV1/FVC ratio: 77 %
Pre FEV6/FVC Ratio: 100 %
RV % pred: 72 %
RV: 1.38 L
TLC % pred: 97 %
TLC: 4.51 L

## 2023-10-13 LAB — THYROID PANEL WITH TSH
Free Thyroxine Index: 1.6 (ref 1.2–4.9)
T3 Uptake Ratio: 22 % — ABNORMAL LOW (ref 24–39)
T4, Total: 7.3 ug/dL (ref 4.5–12.0)
TSH: 2.18 u[IU]/mL (ref 0.450–4.500)

## 2023-10-13 MED ORDER — ALBUTEROL SULFATE (2.5 MG/3ML) 0.083% IN NEBU
2.5000 mg | INHALATION_SOLUTION | Freq: Once | RESPIRATORY_TRACT | Status: AC
  Administered 2023-10-13: 2.5 mg via RESPIRATORY_TRACT

## 2023-10-13 MED ORDER — FUROSEMIDE 40 MG PO TABS: 40.0000 mg | ORAL_TABLET | Freq: Two times a day (BID) | ORAL | 1 refills | Status: DC

## 2023-10-13 MED ORDER — POTASSIUM CHLORIDE CRYS ER 20 MEQ PO TBCR: 20.0000 meq | EXTENDED_RELEASE_TABLET | Freq: Two times a day (BID) | ORAL | 1 refills | Status: DC

## 2023-10-13 NOTE — Telephone Encounter (Signed)
Patient informed and verbalized understanding of plan. Lab order placed for [employer]

## 2023-10-13 NOTE — Telephone Encounter (Signed)
-----   Message from Sharlene Dory sent at 10/12/2023  5:34 PM EST ----- Please see my addendum to my note.  Her STAT labs came back overall looking good.  Recommend to increase Lasix to 40 mg twice daily as well as increase potassium to twice daily.  I have my recommendations listed in my progress note.  I recommend repeating a BMET in 1 to 2 weeks for evaluation of her labs.  Follow-up as scheduled.  Thanks!   Best,  Sharlene Dory, NP

## 2023-10-14 NOTE — Telephone Encounter (Signed)
Pt phoned again stating that no one has called her back. She was advised to call us because she has followed the instructions of Dr Marletta Lor, her heart doctor and her PCP. She states she has been back and forth to the hospital and still no relief from nausea, abd pain, can't eat or sleep. She states that if no one has time for her just say so and she will find someone else. Pt was reassured that that was not the issue and I would try and find someone to help her but all I can recommend if she gets worse please return to ED

## 2023-10-15 ENCOUNTER — Telehealth: Payer: Self-pay | Admitting: Nurse Practitioner

## 2023-10-15 NOTE — Telephone Encounter (Signed)
Pt would like to talk to Sharlene Dory about the problems she is having with GI Doctor

## 2023-10-15 NOTE — Telephone Encounter (Signed)
Phoned and advised the pt of the instructions / recommendations. Pt states that Dr Marletta Lor has already told her these things and they are not working. Pt also asked that I send to her MyChart because she didn't have a inkpen to write with. Pt has appt with Dr Marletta Lor on 3/08. States she wants an earlier appt. Advised the pt I would send note to dr Marletta Lor. She also stated her issue is not being able to eat

## 2023-10-15 NOTE — Telephone Encounter (Signed)
Read her ov note by Marletta Lor. Looked at CT in 10/2023, unremarkable. She was complaining of GERD, constipation/abdominal bloating then.  If she is having nausea/heartburn/reflux, then I would suggest continue pantoprazole 40mg  before breakfast and evening meal (she just started this and needs to give it time to help). She can ADD famotidine 20mg  at lunch and bedtime, (OTC) for the next 2 weeks.  If she is still having bloating/constipation, I would advise Miralax two capfuls in 10 ounces of water daily. If BMs not better after 7 days, then she can add bisacodyl 5mg  daily.   Return ov with Dr. Marletta Lor as scheduled.

## 2023-10-22 ENCOUNTER — Encounter (INDEPENDENT_AMBULATORY_CARE_PROVIDER_SITE_OTHER): Payer: Self-pay

## 2023-10-22 ENCOUNTER — Telehealth: Payer: Self-pay

## 2023-10-22 NOTE — Telephone Encounter (Signed)
Spoke to pt again for about 15 minutes. She knows I have sent several messages to Dr Marletta Lor and that I am waiting on him to answer. She states when she goes to ED they refer back here. When she see her cardiologist they tell her come back here. Everyone is telling her to come back here. She states she can't eat unless its fruit or something light. She complains of nausea and upper abd pain. She states she is not constipated but she is doing the Miralax X a day. ED is more concerned about her heart than her abdomen pain. She has seen Baxter Hire but she is not in. Pt does not want to go to the ED and I advised her that I cannot tell her anything else to do or try (which she understands). All of her Dr's are telling her to come back here. She has had CT done at ED and other test. Pt states if someone would just send in something for nausea that would help. So I'm trying to help her but I'm waiting myself to give instructions to her and yes she wants a call back today. I want to help but what do I do from here

## 2023-10-22 NOTE — Telephone Encounter (Signed)
Please schedule appt with Baxter Hire. Pt advised me that she cannot do Feb 27th. The 25 she can all day , the 26th has to be early or after 3pm.   Pt was advised of the note and she does want the appt. Pt expressed understanding to everything.

## 2023-10-25 DIAGNOSIS — M797 Fibromyalgia: Secondary | ICD-10-CM | POA: Diagnosis not present

## 2023-10-25 DIAGNOSIS — M150 Primary generalized (osteo)arthritis: Secondary | ICD-10-CM | POA: Diagnosis not present

## 2023-10-25 DIAGNOSIS — M2550 Pain in unspecified joint: Secondary | ICD-10-CM | POA: Diagnosis not present

## 2023-10-25 DIAGNOSIS — G894 Chronic pain syndrome: Secondary | ICD-10-CM | POA: Diagnosis not present

## 2023-10-25 NOTE — Telephone Encounter (Signed)
 Agree she needs OV. We will likely need to be more aggressive treating her constipation with Linzess or Movantik.

## 2023-10-26 NOTE — Telephone Encounter (Signed)
 Noted pt has appt tomorrow Feb 26th

## 2023-10-26 NOTE — Progress Notes (Unsigned)
 Referring Provider: [facility] Primary Care Physician:  [facility] Primary GI Physician: Dr. Marletta Lor  Chief Complaint  Patient presents with   Constipation    HPI:   Sarah Porter is a 63 y.o. female with history of anxiety, depression, HTN, HLD, arthritis, fibromyalgia, back pain, diabetes, heart failure with preserved ejection fraction, GERD, Barrett's esophagus, dysphagia, constipation in the setting of opioids, presenting today with complaint of epigastric pain, early satiety, nausea.   Last in the office 09/23/2023.  Recommended starting MiraLAX 1-2 capfuls daily and could add Dulcolax if needed for constipation.  Omeprazole was changed to pantoprazole twice daily due to breakthrough GERD   Today:  Feels like she can't eat anything. Doesn't get hungry. Making herself eat. Associated nausea, epigastric pain. Gets upper abdominal bloating and distension. Continues to gets intermittent chest pain that radiates to her neck and jaw and into her back. During last episode of chest pain, had pain when swallowing water as well.   Only tolerating soup, pop sickle, apple sauce.  Bowels are moving daily with MiraLAX daily.   Continues with weight gain/fluctuations, SOB.  Following with cardiology.  Lasix has been increased.  Reports all of her symptoms have been going on since November, but upper abdominal symptoms seem to be worsening over the last couple of months.  States she started oxycodone twice a day about 6 months ago for arthritis and also helps with restlessness at nighttime.  She also started Trulicity and latter December or early January.   No ETOH.  She does take meloxicam daily.  No OTC NSAIDs.    EGD 07/05/2023 with mild reflux esophagitis, short segment Barrett's esophagus (biopsies confirmatory), gastritis (negative for H. pylori), normal duodenum.   Colonoscopy 07/22/2021: Nonbleeding internal hemorrhoids.  Otherwise normal exam.   Recommended screening in 10 years.    Past Medical History:  Diagnosis Date   Anemia    Anxiety    Back pain    Chest pain    CHF (congestive heart failure) (HCC)    Constipation    Depression    Essential hypertension    Fibromyalgia    GERD (gastroesophageal reflux disease)    Hyperlipidemia    Joint pain    Osteoarthritis    Palpitations    Sleep apnea    SOB (shortness of breath)    Swelling of lower extremity    Type 2 diabetes mellitus (HCC)    Vertigo     Past Surgical History:  Procedure Laterality Date   BILATERAL KNEE REPAIRS     BIOPSY  07/05/2023   Procedure: BIOPSY;  Surgeon: Lanelle Bal, DO;  Location: [facility];  Service: Endoscopy;;   CESAREAN SECTION     x 2   COLONOSCOPY  11/04/2011   Dr. Edrick Kins; diminutive colon polyp in the rectum, otherwise normal exam.  Pathology with tubular adenoma.  Recommended 5-year repeat.   COLONOSCOPY  07/12/2014   Dr. Edrick Kins; submucosal hemorrhages in proximal descending colon?  Prior ischemic or infectious colitis?,  Otherwise normal exam.  Recommended 5-year repeat.   COLONOSCOPY WITH PROPOFOL N/A 07/22/2021   Procedure: COLONOSCOPY WITH PROPOFOL;  Surgeon: Lanelle Bal, DO;  Location: [facility];  Service: Endoscopy;  Laterality: N/A;  8:45am   ESOPHAGOGASTRODUODENOSCOPY  07/21/2004   Dr. Edrick Kins; grade 2 erosive esophagitis at EG junction, cervical inlet patch, otherwise normal exam.   ESOPHAGOGASTRODUODENOSCOPY (EGD) WITH PROPOFOL N/A 07/05/2023   Procedure: ESOPHAGOGASTRODUODENOSCOPY (EGD) WITH  PROPOFOL;  Surgeon: Lanelle Bal, DO;  Location: [facility];  Service: Endoscopy;  Laterality: N/A;  11:15 am, asa 3    Current Outpatient Medications  Medication Sig Dispense Refill   AIMOVIG 140 MG/ML SOAJ Inject 140 mg into the skin every 30 (thirty) days.     aspirin EC 81 MG tablet Take 81 mg by mouth daily. Swallow whole.     atorvastatin (LIPITOR) 80 MG tablet Take 80 mg by  mouth in the morning.     busPIRone (BUSPAR) 10 MG tablet Take 10 mg by mouth 3 (three) times daily.     cholecalciferol (VITAMIN D3) 25 MCG (1000 UNIT) tablet Take 1,000 Units by mouth daily.     Continuous Glucose Receiver (DEXCOM G7 RECEIVER) DEVI      Continuous Glucose Sensor (DEXCOM G7 SENSOR) MISC      DULoxetine (CYMBALTA) 60 MG capsule Take 60 mg by mouth daily.     ezetimibe (ZETIA) 10 MG tablet Take 1 tablet (10 mg total) by mouth daily. 90 tablet 1   furosemide (LASIX) 40 MG tablet Take 1 tablet (40 mg total) by mouth 2 (two) times daily. 60 tablet 1   magnesium 30 MG tablet Take 30 mg by mouth 2 (two) times daily.     meloxicam (MOBIC) 15 MG tablet Take 15 mg by mouth daily.     Multiple Vitamin (MULTIVITAMIN WITH MINERALS) TABS tablet Take 1 tablet by mouth in the morning.     ondansetron (ZOFRAN) 4 MG tablet Take 1 tablet (4 mg total) by mouth every 8 (eight) hours as needed for nausea or vomiting. 30 tablet 1   Oxycodone HCl 10 MG TABS Take 10 mg by mouth every 12 (twelve) hours as needed (pain).     pantoprazole (PROTONIX) 40 MG tablet Take 1 tablet (40 mg total) by mouth 2 (two) times daily. 60 tablet 11   potassium chloride SA (KLOR-CON M) 20 MEQ tablet Take 1 tablet (20 mEq total) by mouth 2 (two) times daily. 60 tablet 1   traZODone (DESYREL) 50 MG tablet Take 50 mg by mouth at bedtime.     TRULICITY 0.75 MG/0.5ML SOAJ Inject into the skin.     UBRELVY 100 MG TABS Take 1 tablet by mouth daily as needed (migraine).     vitamin C (ASCORBIC ACID) 500 MG tablet Take 500 mg by mouth in the morning.     No current facility-administered medications for this visit.    Allergies as of 10/27/2023   (No Known Allergies)    Family History  Problem Relation Age of Onset   AAA (abdominal aortic aneurysm) Mother    Sudden death Mother    Alcoholism Mother    Dementia Father    Asthma Sister    Kidney cancer Sister        had kidney removed   Diabetes Sister    Lymphoma  Nephew    Colon cancer Neg Hx     Social History   Socioeconomic History   Marital status: Significant Other    Spouse name: Not on file   Number of children: Not on file   Years of education: Not on file   Highest education level: Not on file  Occupational History   Not on file  Tobacco Use   Smoking status: Former    Current packs/day: 0.00    Types: Cigarettes    Quit date: 09/01/1999    Years since quitting: 24.1    Passive exposure:  Never   Smokeless tobacco: Never   Tobacco comments:    smoked for approx 20 years   Vaping Use   Vaping status: Never Used  Substance and Sexual Activity   Alcohol use: Not Currently   Drug use: Yes    Types: Marijuana    Comment: occasionally-cbd   Sexual activity: Yes    Birth control/protection: Post-menopausal  Other Topics Concern   Not on file  Social History Narrative   Are you right handed or left handed? Right   Are you currently employed ? Retired   What is your current occupation?   Do you live at home alone? NO    Who lives with you?    What type of home do you live in: 1 story or 2 story? 1       Social Drivers of Corporate investment banker Strain: Low Risk  (12/16/2022)   Overall Financial Resource Strain (CARDIA)    Difficulty of Paying Living Expenses: Not very hard  Food Insecurity: No Food Insecurity (12/16/2022)   Hunger Vital Sign    Worried About Running Out of Food in the Last Year: Never true    Ran Out of Food in the Last Year: Never true  Transportation Needs: No Transportation Needs (12/16/2022)   PRAPARE - Administrator, Civil Service (Medical): No    Lack of Transportation (Non-Medical): No  Physical Activity: Sufficiently Active (12/16/2022)   Exercise Vital Sign    Days of Exercise per Week: 3 days    Minutes of Exercise per Session: 60 min  Stress: Stress Concern Present (12/16/2022)   [employer] of Occupational Health - Occupational Stress Questionnaire    Feeling of Stress :  Very much  Social Connections: Socially Integrated (12/16/2022)   Social Connection and Isolation Panel [NHANES]    Frequency of Communication with Friends and Family: More than three times a week    Frequency of Social Gatherings with Friends and Family: Three times a week    Attends Religious Services: More than 4 times per year    Active Member of Clubs or Organizations: Yes    Attends Engineer, structural: More than 4 times per year    Marital Status: Living with partner    Review of Systems: Gen: Denies fever, chills, cold or flu like symptoms, pre-syncope, or syncope.  CV: Admits to chest pain. Resp: Admits to shortness of breath. GI: See HPI Heme: See HPI  Physical Exam: BP 133/77 (BP Location: Left Arm, Patient Position: Sitting, Cuff Size: Large)   Pulse 67   Temp (!) 97.4 F (36.3 C) (Oral)   Ht 5\' 1"  (1.549 m)   Wt 230 lb 6.4 oz (104.5 kg)   BMI 43.53 kg/m  General:   Alert and oriented. No distress noted. Pleasant and cooperative.  Head:  Normocephalic and atraumatic. Eyes:  Conjuctiva clear without scleral icterus. Heart:  S1, S2 present without murmurs appreciated. Lungs:  Clear to auscultation bilaterally. No wheezes, rales, or rhonchi. No distress.  Abdomen:  +BS, soft, and non-distended. Mild TTP in epigastric area. No rebound or guarding. No HSM or masses noted. Msk:  Symmetrical without gross deformities. Normal posture. Extremities:  Without edema. Neurologic:  Alert and  oriented x4 Psych:  Normal mood and affect.    Assessment:  63 y.o. female with history of anxiety, depression, HTN, HLD, arthritis, fibromyalgia, back pain, diabetes, heart failure with preserved ejection fraction, GERD, Barrett's esophagus, dysphagia, constipation in the setting  of opioids, presenting today with complaint of epigastric pain, early satiety, nausea.   Epigastric pain, nausea without vomiting, early satiety:  Ongoing since November 2024, but worsening. Prior  evaluation has included EGD 07/05/2023 with mild reflux esophagitis, short segment Barrett's esophagus (biopsies confirmatory), gastritis (negative for H. pylori), normal duodenum. CT A/P with contrast 10/07/23 without acute abnormality.  Labs 10/12/23 remarkable for slight elevation of alkaline phosphatase at 124.  Prior RUQ ultrasound in October 2024 with no cholelithiasis.  Differentials include adverse side effects of Trulicity which was started in the latter part of December/early January.  Likely has a degree of gastroparesis at least in setting of Trulicity and oxycodone if not at baseline in the setting of diabetes. Biliary dyskinesia also in the differential as she also reports pain radiating to her jaw and back at times which could be referred from her gallbladder. May also have worsening gastritis in the setting of Meloxicam.   Will pursue HIDA, have her talk to PCP about discontinuing GLP-1 agonist, hold oxycodone and meloxicam. Will also recheck HFP and Lipase to ensure no acute abnormalities with her liver or pancreas.   Elevated alk phos:  Mild elevation of 124 on 10/11/22. Will repeat with GGT. Known history of fatty liver. No biliary abnormalities on [place] in October 2024 or CT in February 2025.     Plan:  HIDA HFP, GGT, Lipase  Stop oxycodone Stop meloxicam May take Tylenol as needed.  No more than 3000 mg of Tylenol per 24 hours. Requested patient to talk with PCP about changing Trulicity.  Recommended avoiding all GLP-1 agonist. Continue pantoprazole 40 mg twice daily. Zofran 4 mg every 8 hours as needed. Follow-up in 4 weeks or sooner if needed.    Ermalinda Memos, PA-C [facility] 10/27/2023

## 2023-10-27 ENCOUNTER — Encounter: Payer: Self-pay | Admitting: Gastroenterology

## 2023-10-27 ENCOUNTER — Ambulatory Visit: Payer: 59 | Admitting: Gastroenterology

## 2023-10-27 VITALS — BP 133/77 | HR 67 | Temp 97.4°F | Ht 61.0 in | Wt 230.4 lb

## 2023-10-27 DIAGNOSIS — R748 Abnormal levels of other serum enzymes: Secondary | ICD-10-CM | POA: Diagnosis not present

## 2023-10-27 DIAGNOSIS — R1013 Epigastric pain: Secondary | ICD-10-CM | POA: Diagnosis not present

## 2023-10-27 DIAGNOSIS — R110 Nausea: Secondary | ICD-10-CM | POA: Diagnosis not present

## 2023-10-27 DIAGNOSIS — R112 Nausea with vomiting, unspecified: Secondary | ICD-10-CM

## 2023-10-27 DIAGNOSIS — R6881 Early satiety: Secondary | ICD-10-CM | POA: Diagnosis not present

## 2023-10-27 DIAGNOSIS — Z8719 Personal history of other diseases of the digestive system: Secondary | ICD-10-CM

## 2023-10-27 MED ORDER — ONDANSETRON HCL 4 MG PO TABS: 4.0000 mg | ORAL_TABLET | Freq: Three times a day (TID) | ORAL | 1 refills | Status: DC | PRN

## 2023-10-27 NOTE — Progress Notes (Deleted)
 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 Chronic dizziness, may be cervicogenic.  She also has low blood pressure, which may be playing a role.     Refer to physical therapy for neck exercises and vestibular rehab Discontinue ibuprofen and acetaminophen.  For acute flares of headache, may use baclofen 10mg  but limit to no more than 2 days out of week. Taper off topiramate as it has not been effective and to also limit polypharmacy She will continue to see how she does on Aimovig, if there is a  migraine component to her headaches. Follow up with PCP regarding change in blood pressure management. Follow up 5 months.   Subjective:  Sarah Porter is a 63 year old female with ADD, HTN, DM II with neuropathy, HLD, GERD, chronic fatigue, CKD stage 2, anxiety and depression who follows up for chronic headaches and dizziness.  UPDATE: Last seen in April 2024. At that time, advised to discontinue OTC analgesics, taper off topiramate and referred to PT for neck exercises and vestibular rehab.  She started performing home neck stretches which helped, so she cancelled PT.    Intensity:  *** Duration:  *** Frequency:  *** Frequency of abortive medication: *** Current NSAIDS/analgesics:  ASA 81mg  daily Current triptans:  none Current ergotamine:  none Current anti-emetic:  none Current muscle relaxants:  none Current Antihypertensive medications:  metoprolol succinate 25mg  daily, lisinopril  Current Antidepressant medications:  duloxetine 60mg  daily (anxiety - higher doses ineffective) Current Anticonvulsant medications:  pregablin 50mg  BID (RLS) Current anti-CGRP:  Ubrelvy 100mg , Aimovig 140mg   Current Vitamins/Herbal/Supplements:  magnesium 400mg  daily, melatonin, MVI, C Current Antihistamines/Decongestants:  none Other therapy:  massage therapy Other medications:   buspirone, trazodone 50mg  at bedtime    Caffeine:  1 cup coffee 1 to 2 times a day (she used to drink more).  No soda Diet:  Water.  Sometimes skips meals.  Lac of appetite Exercise:  water aerobics.  Due to her symptoms, cannot go to the gym  Depression:  yes; Anxiety:  yes Other pain:  neck pain (DDD); questionable fibromyalgia.  Will be seeing rheumatology. Sleep hygiene:  Insomnia.  Being evaluated for OSA.  HISTORY: Around 2022, after sexual intercourse, she stood up and developed severe vertigo with nausea and vomiting.  Lasted about an hour but will feel off balance for a few days..  Has since developed a headache.  An aching or pressure that start at base of neck/shoulders and radiates up the back of head bilaterally to the front and temples.  Associated with dizziness.  Sometimes nausea or blurred vision.  No photophobia or phonophobia.  They last all day.  Both headaches and dizzy spells were initially episodic.  The have progressively gotten more frequent and has been daily since November 2023.  Cannot go to the gym.   No known triggers.  Heat and massage therapy helps.  Takes Tylenol or ibuprofen daily.  She also has chronic fatigue and chronic pain.  She has burning and tingling in arms and legs.  Has RLS.  Pain in joints and muscles.    07/04/2022 MRI BRAIN WO:  No evidence of acute intracranial abnormality.  Mild multifocal T2 FLAIR hyperintense signal abnormality within the  cerebral white matter. These signal changes are nonspecific, but  most often secondary to chronic small vessel ischemia. Alternative  considerations include sequelae of chronic migraine headaches or  sequelae of  a prior infectious/inflammatory process, among others. 14 mm mucous retention cyst within the right maxillary sinus.   Past NSAIDS/analgesics:  Tylenol, ibuprofen Past abortive triptans:  none Past abortive ergotamine:  none Past muscle relaxants:  Flexeril, baclofen Past anti-emetic:  Zofran Past  antihypertensive medications:  none Past antidepressant medications:  fluoxetine Past anticonvulsant medications:  topiramate 75mg  daily, gabapentin Past anti-CGRP:  Nurtec ODT 75mg  PRN Past vitamins/Herbal/Supplements:  none Past antihistamines/decongestants:  none Other past therapies:  occipital nerve block, trigger point injections.   Family history of headache:  sister (headaches related to benign brain tumor, migraines)  PAST MEDICAL HISTORY: Past Medical History:  Diagnosis Date   Anemia    Anxiety    Back pain    Chest pain    CHF (congestive heart failure) (HCC)    Constipation    Depression    Essential hypertension    Fibromyalgia    GERD (gastroesophageal reflux disease)    Hyperlipidemia    Joint pain    Osteoarthritis    Palpitations    Sleep apnea    SOB (shortness of breath)    Swelling of lower extremity    Type 2 diabetes mellitus (HCC)    Vertigo     MEDICATIONS: Current Outpatient Medications on File Prior to Visit  Medication Sig Dispense Refill   AIMOVIG 140 MG/ML SOAJ Inject 140 mg into the skin every 30 (thirty) days.     aspirin EC 81 MG tablet Take 81 mg by mouth daily. Swallow whole.     atorvastatin (LIPITOR) 80 MG tablet Take 80 mg by mouth in the morning.     baclofen (LIORESAL) 10 MG tablet Take 10 mg by mouth 2 (two) times daily as needed for muscle spasms.     busPIRone (BUSPAR) 10 MG tablet Take 10 mg by mouth 3 (three) times daily.     cholecalciferol (VITAMIN D3) 25 MCG (1000 UNIT) tablet Take 1,000 Units by mouth daily.     Continuous Glucose Receiver (DEXCOM G7 RECEIVER) DEVI      Continuous Glucose Sensor (DEXCOM G7 SENSOR) MISC      DULoxetine (CYMBALTA) 60 MG capsule Take 60 mg by mouth daily.     ezetimibe (ZETIA) 10 MG tablet Take 1 tablet (10 mg total) by mouth daily. 90 tablet 1   furosemide (LASIX) 40 MG tablet Take 1 tablet (40 mg total) by mouth 2 (two) times daily. 60 tablet 1   losartan (COZAAR) 100 MG tablet Take 100  mg by mouth daily.     magnesium 30 MG tablet Take 30 mg by mouth 2 (two) times daily.     meloxicam (MOBIC) 15 MG tablet Take 15 mg by mouth daily.     Multiple Vitamin (MULTIVITAMIN WITH MINERALS) TABS tablet Take 1 tablet by mouth in the morning.     Oxycodone HCl 10 MG TABS Take 10 mg by mouth every 12 (twelve) hours as needed (pain).     pantoprazole (PROTONIX) 40 MG tablet Take 1 tablet (40 mg total) by mouth 2 (two) times daily. 60 tablet 11   potassium chloride SA (KLOR-CON M) 20 MEQ tablet Take 1 tablet (20 mEq total) by mouth 2 (two) times daily. 60 tablet 1   traZODone (DESYREL) 50 MG tablet Take 50 mg by mouth at bedtime.     TRULICITY 0.75 MG/0.5ML SOAJ Inject into the skin.     UBRELVY 100 MG TABS Take 1 tablet by mouth daily as needed (migraine).  vitamin C (ASCORBIC ACID) 500 MG tablet Take 500 mg by mouth in the morning.     No current facility-administered medications on file prior to visit.    ALLERGIES: No Known Allergies  FAMILY HISTORY: Family History  Problem Relation Age of Onset   AAA (abdominal aortic aneurysm) Mother    Sudden death Mother    Alcoholism Mother    Dementia Father    Asthma Sister    Kidney cancer Sister        had kidney removed   Diabetes Sister    Lymphoma Nephew    Colon cancer Neg Hx       Objective:  *** General: No acute distress.  Patient appears ***-groomed.   Head:  Normocephalic/atraumatic Eyes:  Fundi examined but not visualized Neck: supple, no paraspinal tenderness, full range of motion Heart:  Regular rate and rhythm Lungs:  Clear to auscultation bilaterally Back: No paraspinal tenderness Neurological Exam: alert and oriented.  Speech fluent and not dysarthric, language intact.  CN II-XII intact. Bulk and tone normal, muscle strength 5/5 throughout.  Sensation to light touch intact.  Deep tendon reflexes 2+ throughout, toes downgoing.  Finger to nose testing intact.  Gait normal, Romberg negative.   Shon Millet,  DO  CC: ***

## 2023-10-27 NOTE — Patient Instructions (Addendum)
 After our discussion today, I suspect Trulicity may be the cause of your upper abdominal pain, nausea, sensation of getting full quickly, bloating.  Trulicity along with oxycodone may be inducing a condition called gastroparesis which is a delay in gastric emptying.  I recommend that you talk with your primary care doctor about changing Trulicity to a different diabetes medication.  At this point, I recommend that you avoid all GLP-1 agonist type medications.  I also recommend that you stop oxycodone if you are able.  Meloxicam can also cause inflammation and ulceration in your GI tract.  I recommend that you avoid this.  Continue pantoprazole 40 mg twice a day 30 minutes before breakfast and dinner.  In addition to gastroparesis, I would like to rule out something called biliary dyskinesia which is a dysfunction of her gallbladder.  We will arrange for you to have a HIDA scan at [facility] to evaluate this.  I would also like for you to have blood work completed at [facility] to recheck your liver enzymes and to check a lipase.  I am sending in Zofran for you to use as needed for nausea/vomiting.  Regarding your sodium intake, I recommend that you consume more than 1800 to 2000 mg of sodium per day.  This includes everything that you eat and drink.  Be sure to look at the nutrition label to see how much sodium is in each serving you are consuming.   I will plan to see back in 4 weeks or sooner if needed.  Ermalinda Memos, PA-C [facility]

## 2023-10-28 ENCOUNTER — Ambulatory Visit: Payer: 59 | Admitting: Neurology

## 2023-10-29 ENCOUNTER — Encounter (HOSPITAL_COMMUNITY): Payer: Self-pay

## 2023-10-29 ENCOUNTER — Encounter (HOSPITAL_COMMUNITY)
Admission: RE | Admit: 2023-10-29 | Discharge: 2023-10-29 | Disposition: A | Discharge status: Home / Self Care | Payer: 59 | Source: Ambulatory Visit | Attending: Gastroenterology | Admitting: Gastroenterology

## 2023-10-29 DIAGNOSIS — R1013 Epigastric pain: Secondary | ICD-10-CM | POA: Insufficient documentation

## 2023-10-29 DIAGNOSIS — R1010 Upper abdominal pain, unspecified: Secondary | ICD-10-CM | POA: Diagnosis not present

## 2023-10-29 DIAGNOSIS — R112 Nausea with vomiting, unspecified: Secondary | ICD-10-CM | POA: Diagnosis not present

## 2023-10-29 MED ORDER — TECHNETIUM TC 99M MEBROFENIN IV KIT
5.0000 | PACK | Freq: Once | INTRAVENOUS | Status: AC | PRN
  Administered 2023-10-29: 5.2 via INTRAVENOUS

## 2023-11-01 ENCOUNTER — Ambulatory Visit: Payer: 59 | Admitting: Neurology

## 2023-11-01 DIAGNOSIS — R0602 Shortness of breath: Secondary | ICD-10-CM | POA: Diagnosis not present

## 2023-11-01 DIAGNOSIS — Z79899 Other long term (current) drug therapy: Secondary | ICD-10-CM | POA: Diagnosis not present

## 2023-11-01 DIAGNOSIS — R748 Abnormal levels of other serum enzymes: Secondary | ICD-10-CM | POA: Diagnosis not present

## 2023-11-01 DIAGNOSIS — R1013 Epigastric pain: Secondary | ICD-10-CM | POA: Diagnosis not present

## 2023-11-01 DIAGNOSIS — I5030 Unspecified diastolic (congestive) heart failure: Secondary | ICD-10-CM | POA: Diagnosis not present

## 2023-11-01 DIAGNOSIS — R112 Nausea with vomiting, unspecified: Secondary | ICD-10-CM | POA: Diagnosis not present

## 2023-11-02 LAB — BASIC METABOLIC PANEL
BUN/Creatinine Ratio: 17 (ref 12–28)
BUN: 16 mg/dL (ref 8–27)
CO2: 25 mmol/L (ref 20–29)
Calcium: 9.8 mg/dL (ref 8.7–10.3)
Chloride: 97 mmol/L (ref 96–106)
Creatinine, Ser: 0.93 mg/dL (ref 0.57–1.00)
Glucose: 132 mg/dL — ABNORMAL HIGH (ref 70–99)
Potassium: 4.7 mmol/L (ref 3.5–5.2)
Sodium: 140 mmol/L (ref 134–144)
eGFR: 69 mL/min/{1.73_m2} (ref >59)

## 2023-11-02 LAB — LIPASE: Lipase: 19 U/L (ref 14–72)

## 2023-11-02 LAB — HEPATIC FUNCTION PANEL
ALT: 35 IU/L — ABNORMAL HIGH (ref 0–32)
AST: 30 IU/L (ref 0–40)
Albumin: 4.6 g/dL (ref 3.9–4.9)
Alkaline Phosphatase: 127 IU/L — ABNORMAL HIGH (ref 44–121)
Bilirubin Total: 0.8 mg/dL (ref 0.0–1.2)
Bilirubin, Direct: 0.21 mg/dL (ref 0.00–0.40)
Total Protein: 7.3 g/dL (ref 6.0–8.5)

## 2023-11-02 LAB — GAMMA GT: GGT: 33 IU/L (ref 0–60)

## 2023-11-03 ENCOUNTER — Other Ambulatory Visit: Payer: Self-pay | Admitting: *Deleted

## 2023-11-03 DIAGNOSIS — F909 Attention-deficit hyperactivity disorder, unspecified type: Secondary | ICD-10-CM

## 2023-11-03 DIAGNOSIS — R1013 Epigastric pain: Secondary | ICD-10-CM

## 2023-11-04 ENCOUNTER — Ambulatory Visit: Payer: 59 | Admitting: Internal Medicine

## 2023-11-05 ENCOUNTER — Ambulatory Visit: Payer: 59 | Attending: Student | Admitting: Student

## 2023-11-05 ENCOUNTER — Encounter: Payer: Self-pay | Admitting: Student

## 2023-11-05 VITALS — BP 130/80 | HR 62 | Ht 61.0 in | Wt 222.4 lb

## 2023-11-05 DIAGNOSIS — R072 Precordial pain: Secondary | ICD-10-CM

## 2023-11-05 DIAGNOSIS — R079 Chest pain, unspecified: Secondary | ICD-10-CM

## 2023-11-05 DIAGNOSIS — R600 Localized edema: Secondary | ICD-10-CM | POA: Diagnosis not present

## 2023-11-05 DIAGNOSIS — R002 Palpitations: Secondary | ICD-10-CM

## 2023-11-05 DIAGNOSIS — R109 Unspecified abdominal pain: Secondary | ICD-10-CM | POA: Diagnosis not present

## 2023-11-05 DIAGNOSIS — R0609 Other forms of dyspnea: Secondary | ICD-10-CM

## 2023-11-05 MED ORDER — METOPROLOL TARTRATE 50 MG PO TABS: 50.0000 mg | ORAL_TABLET | ORAL | 0 refills | Status: DC

## 2023-11-05 NOTE — Progress Notes (Signed)
 Cardiology Office Note    Date:  11/06/2023  ID:  Sarah Porter, DOB 05/04/1961, MRN [id number] Cardiologist: [name], MD    History of Present Illness:    Sarah Porter is a 63 y.o. female with past medical history of atypical chest pain, palpitations, HLD and Type II DM who presents to the office today for 1 month follow-up.  She was last examined by Sharlene Dory, NP in 10/2023 and reported persistent shortness of breath despite taking Lasix 40 mg daily. Her weight had declined by 10 pounds since her prior visit (at 228 lbs on the office scales). She had been taking Lisinopril and Losartan, therefore Lisinopril was discontinued and follow-up labs were obtained along with obtaining PFT's. Recent CTA had been negative for PE. Follow-up labs showed her BNP was normal at 16 and kidney function and electrolytes remained stable. Lasix was titrated to 40 mg twice daily. PFT's showed minimal airway obstruction with some response to bronchodilator therapy but baseline spirometry flows were within normal limits when reviewed again with Pulmonology. They recommended considering initiation of an Albuterol rescue inhaler to start and could be followed by her PCP.  She did follow-up with GI last month and reported nausea and epigastric pain and minimal PO intake. Prior EGD had shown esophagitis and Barrett's esophagus along with gastritis. HIDA scan was recommended and she was encouraged to review with her PCP in regards to discontinuing GLP-1 agonist. 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  In talking with the patient today, she reports that her dyspnea did improve with dose adjustment of diuretic therapy but she is still having shortness of breath with minimal activity. Also reports intermittent episodes of chest pain which occur at rest or with activity and last for a few minutes and spontaneously resolve. Denies any specific orthopnea or  PND.  Lower extremity edema has significantly improved. She is frustrated that she has not lost weight despite dietary changes. Her appetite has been minimal given her GI issues as discussed above.  Studies Reviewed:   EKG: EKG is not ordered today.  NST: 05/2022   Findings are consistent with no prior ischemia. The study is low risk.   No ST deviation was noted.   Left ventricular function is normal. End diastolic cavity size is normal. End systolic cavity size is normal.   Prior study not available for comparison.   IMPRESSIONS Negative for stress induced arrhythmias.  Stress ECG nondiagnostic due to pharmacologic protocol. Normal left ventricular function and size. There is an anteroseptal perfusion defect in rest that that partial improves with stress and with normal wall motion.  Artifact is favored over infarct. There is diaphragmatic activity at rest that is not present at stress.   CONCLUSIONS No ischemia. Low risk study.  Echocardiogram: 09/2023 IMPRESSIONS     1. Left ventricular ejection fraction, by estimation, is 65 to 70%. The  left ventricle has normal function. The left ventricle has no regional  wall motion abnormalities. Left ventricular diastolic parameters were  normal. The average left ventricular  global longitudinal strain is -22.8 %. The global longitudinal strain is  normal.   2. Right ventricular systolic function is normal. The right ventricular  size is normal.   3. The mitral valve is normal in structure. No evidence of mitral valve  regurgitation. No evidence of mitral stenosis.   4. The aortic valve is tricuspid. Aortic valve regurgitation is not  visualized. No aortic stenosis is  present.   5. The inferior vena cava is normal in size with greater than 50%  respiratory variability, suggesting right atrial pressure of 3 mmHg.   Comparison(s): No prior Echocardiogram.    Physical Exam:   VS:  BP 130/80   Pulse 62   Ht 5\' 1"  (1.549 m)   Wt  222 lb 6.4 oz (100.9 kg)   SpO2 96%   BMI 42.02 kg/m    Wt Readings from Last 3 Encounters:  11/05/23 222 lb 6.4 oz (100.9 kg)  10/27/23 230 lb 6.4 oz (104.5 kg)  10/12/23 228 lb 3.2 oz (103.5 kg)     GEN: Well nourished, well developed female appearing in no acute distress NECK: No JVD; No carotid bruits CARDIAC:RRR, no murmurs, rubs, gallops RESPIRATORY:  Clear to auscultation without rales, wheezing or rhonchi  ABDOMEN: Appears non-distended. No obvious abdominal masses. EXTREMITIES: No clubbing or cyanosis. No pitting edema.  Distal pedal pulses are 2+ bilaterally.   Assessment and Plan:   1. Dyspnea on Exertion/Chest Pain with Mixed features/Coronary Calcification - She is still having significant dyspnea on exertion and episodes of intermittent chest pain. Prior CTA was negative for PE but did show coronary calcifications. Prior NST in 2023 showed artifact but no significant ischemia. Reviewed with the patient today and will plan to obtain a Coronary CTA for further assessment. She may require upcoming cholecystectomy and we reviewed that this would help guide cardiac clearance as well.  2. Lower Extremity Edema - Symptoms have significantly improved with dose adjustment of diuretic therapy. BNP was mildly elevated at 134 in 09/2023 but has since been within normal limits and normal at 16 when checked in 10/2023. Echo in 09/2023 showed a preserved EF of 65 to 70% and diastolic parameters were actually normal. - Continue Lasix 40 mg twice daily for now as recent labs on 11/01/2023 showed her creatinine remained stable at 0.93 with K+ at 4.7.  3. Palpitations - Reports symptoms have overall been well-controlled. She is no longer on AV nodal blocking agents.  4. Abdominal Pain - Being followed by GI. 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   Signed, Ellsworth Lennox, PA-C

## 2023-11-05 NOTE — Patient Instructions (Signed)
 Medication Instructions:   Hold your Lasix the morning of your cardiac CT Scan   Take Lopressor 50 mg two hours prior to your cardiac CT Scan   *If you need a refill on your cardiac medications before your next appointment, please call your pharmacy*   Lab Work: NONE   If you have labs (blood work) drawn today and your tests are completely normal, you will receive your results only by: MyChart Message (if you have MyChart) OR A paper copy in the mail If you have any lab test that is abnormal or we need to change your treatment, we will call you to review the results.   Testing/Procedures:   Your cardiac CT will be scheduled at one of the below locations:   [facility] [address] [place], [place] [address] [phone]  OR  [facility] [address] [place], [place] [address] [phone]  OR   [facility] [address] [place], [place] [address] [phone]  OR   [facility] [address] [place], [place] [address] [phone]  If scheduled at [facility], please arrive at the [facility] and Children's Entrance (Entrance C2) of [facility] 30 minutes prior to test start time. You can use the FREE valet parking offered at entrance C (encouraged to control the heart rate for the test)  Proceed to the [facility] Radiology Department (first floor) to check-in and test prep.  All radiology patients and guests should use entrance C2 at [facility], accessed from [facility], even though the hospital's physical address listed is [address]    If scheduled at [facility] or [facility], please arrive 15 mins early for check-in and test prep.  There is spacious parking and easy access to the radiology department from the [facility] entrance. Please enter  here and check-in with the desk attendant.   If scheduled at [facility], please arrive 30 minutes early for check-in and test prep.  Please follow these instructions carefully (unless otherwise directed):  An IV will be required for this test and Nitroglycerin will be given.  Hold all erectile dysfunction medications at least 3 days (72 hrs) prior to test. (Ie viagra, cialis, sildenafil, tadalafil, etc)   On the Night Before the Test: Be sure to Drink plenty of water. Do not consume any caffeinated/decaffeinated beverages or chocolate 12 hours prior to your test. Do not take any antihistamines 12 hours prior to your test.   On the Day of the Test: Drink plenty of water until 1 hour prior to the test. Do not eat any food 1 hour prior to test. You may take your regular medications prior to the test.  Take metoprolol (Lopressor) two hours prior to test. If you take Furosemide/Hydrochlorothiazide/Spironolactone/Chlorthalidone, please HOLD on the morning of the test. Patients who wear a continuous glucose monitor MUST remove the device prior to scanning. FEMALES- please wear underwire-free bra if available, avoid dresses & tight clothing  *For Clinical Staff only. Please instruct patient the following:* Heart Rate Medication Recommendations for Cardiac CT  Resting HR < 50 bpm  No medication  Resting HR 50-60 bpm and BP >110/50 mmHG   Consider Metoprolol tartrate 25 mg PO 90-120 min prior to scan  Resting HR 60-65 bpm and BP >110/50 mmHG  Metoprolol tartrate 50 mg PO 90-120 minutes prior to scan  Resting HR > 65 bpm and BP >110/50 mmHG  Metoprolol tartrate 100 mg PO 90-120 minutes prior to scan  Consider Ivabradine 10-15 mg PO or a calcium channel blocker for resting HR >60 bpm and contraindication to metoprolol tartrate  Consider Ivabradine 10-15 mg PO in combination with metoprolol tartrate for HR >80 bpm        After the Test: Drink plenty of water. After receiving IV  contrast, you may experience a mild flushed feeling. This is normal. On occasion, you may experience a mild rash up to 24 hours after the test. This is not dangerous. If this occurs, you can take Benadryl 25 mg, Zyrtec, Claritin, or Allegra and increase your fluid intake. (Patients taking Tikosyn should avoid Benadryl, and may take Zyrtec, Claritin, or Allegra) If you experience trouble breathing, this can be serious. If it is severe call 911 IMMEDIATELY. If it is mild, please call our office.  We will call to schedule your test 2-4 weeks out understanding that some insurance companies will need an authorization prior to the service being performed.   For more information and frequently asked questions, please visit our website : [email or web address]  For non-scheduling related questions, please contact the cardiac imaging nurse navigator should you have any questions/concerns: Cardiac Imaging Nurse Navigators Direct Office Dial: [phone]   For scheduling needs, including cancellations and rescheduling, please call [place], [phone].    Follow-Up: At [facility], you and your health needs are our priority.  As part of our continuing mission to provide you with exceptional heart care, we have created designated Provider Care Teams.  These Care Teams include your primary Cardiologist (physician) and Advanced Practice Providers (APPs -  Physician Assistants and Nurse Practitioners) who all work together to provide you with the care you need, when you need it.  We recommend signing up for the patient portal called "MyChart".  Sign up information is provided on this After Visit Summary.  MyChart is used to connect with patients for Virtual Visits (Telemedicine).  Patients are able to view lab/test results, encounter notes, upcoming appointments, etc.  Non-urgent messages can be sent to your provider as well.   To learn more about what you can do with MyChart, go to  [email or web address].    Your next appointment:   3 month(s)  Provider:   You may see [name], MD or one of the following Advanced Practice Providers on your designated Care Team:   Randall An, PA-C  Jacolyn Reedy, PA-C     Other Instructions Thank you for choosing [facility]!

## 2023-11-06 ENCOUNTER — Encounter: Payer: Self-pay | Admitting: Student

## 2023-11-10 ENCOUNTER — Telehealth (HOSPITAL_COMMUNITY): Payer: Self-pay | Admitting: *Deleted

## 2023-11-10 NOTE — Telephone Encounter (Signed)
 Reaching out to patient to offer assistance regarding upcoming cardiac imaging study; pt verbalizes understanding of appt date/time, parking situation and where to check in, pre-test NPO status and medications ordered, and verified current allergies; name and call back number provided for further questions should they arise  Larey Brick RN Navigator Cardiac Imaging [facility] [phone] office [phone] cell  Patient to hold her 50mg  metoprolol tartrate if HR is greater than 70 bpm TWO hours prior to her cardiac CT scan. She is aware to arrive at 12 PM.

## 2023-11-12 ENCOUNTER — Ambulatory Visit (HOSPITAL_COMMUNITY)
Admission: RE | Admit: 2023-11-12 | Discharge: 2023-11-12 | Disposition: A | Discharge status: Home / Self Care | Source: Ambulatory Visit | Attending: Student | Admitting: Student

## 2023-11-12 DIAGNOSIS — R072 Precordial pain: Secondary | ICD-10-CM | POA: Diagnosis not present

## 2023-11-12 MED ORDER — IOHEXOL 350 MG/ML SOLN
95.0000 mL | Freq: Once | INTRAVENOUS | Status: AC | PRN
  Administered 2023-11-12: 95 mL via INTRAVENOUS

## 2023-11-12 MED ORDER — NITROGLYCERIN 0.4 MG SL SUBL
0.8000 mg | SUBLINGUAL_TABLET | Freq: Once | SUBLINGUAL | Status: AC
  Administered 2023-11-12: 0.8 mg via SUBLINGUAL

## 2023-11-12 MED ORDER — NITROGLYCERIN 0.4 MG SL SUBL
SUBLINGUAL_TABLET | SUBLINGUAL | Status: AC
  Filled 2023-11-12: qty 2

## 2023-11-12 NOTE — Progress Notes (Signed)
 Patient tolerated CT well. Vital signs stable encourage to drink water throughout day.Reasons explained and verbalized understanding. Ambulated steady gait.

## 2023-11-16 ENCOUNTER — Encounter: Payer: Self-pay | Admitting: Surgery

## 2023-11-16 ENCOUNTER — Ambulatory Visit: Admitting: Surgery

## 2023-11-16 VITALS — BP 119/80 | HR 58 | Temp 97.5°F | Ht 61.0 in | Wt 222.0 lb

## 2023-11-16 DIAGNOSIS — K8050 Calculus of bile duct without cholangitis or cholecystitis without obstruction: Secondary | ICD-10-CM

## 2023-11-16 DIAGNOSIS — K828 Other specified diseases of gallbladder: Secondary | ICD-10-CM

## 2023-11-16 NOTE — Progress Notes (Unsigned)
 [facility] History and Physical  Reason for Referral:*** Referring Physician: ***  Chief Complaint   New Patient (Initial Visit)     Sarah Porter is a 63 y.o. female.  HPI: ***.  The *** started *** and has had a duration of ***.  It is associated with ***.  The *** is improved with ***, and is made worse with ***.    Quality*** Context***  Past Medical History:  Diagnosis Date   Anemia    Anxiety    Back pain    Chest pain    CHF (congestive heart failure) (HCC)    Constipation    Depression    Essential hypertension    Fibromyalgia    GERD (gastroesophageal reflux disease)    Hyperlipidemia    Joint pain    Osteoarthritis    Palpitations    Sleep apnea    SOB (shortness of breath)    Swelling of lower extremity    Type 2 diabetes mellitus (HCC)    Vertigo     Past Surgical History:  Procedure Laterality Date   BILATERAL KNEE REPAIRS     BIOPSY  07/05/2023   Procedure: BIOPSY;  Surgeon: Lanelle Bal, DO;  Location: [facility];  Service: Endoscopy;;   CESAREAN SECTION     x 2   COLONOSCOPY  11/04/2011   Dr. Edrick Kins; diminutive colon polyp in the rectum, otherwise normal exam.  Pathology with tubular adenoma.  Recommended 5-year repeat.   COLONOSCOPY  07/12/2014   Dr. Edrick Kins; submucosal hemorrhages in proximal descending colon?  Prior ischemic or infectious colitis?,  Otherwise normal exam.  Recommended 5-year repeat.   COLONOSCOPY WITH PROPOFOL N/A 07/22/2021   Procedure: COLONOSCOPY WITH PROPOFOL;  Surgeon: Lanelle Bal, DO;  Location: [facility];  Service: Endoscopy;  Laterality: N/A;  8:45am   ESOPHAGOGASTRODUODENOSCOPY  07/21/2004   Dr. Edrick Kins; grade 2 erosive esophagitis at EG junction, cervical inlet patch, otherwise normal exam.   ESOPHAGOGASTRODUODENOSCOPY (EGD) WITH PROPOFOL N/A 07/05/2023   Procedure: ESOPHAGOGASTRODUODENOSCOPY (EGD) WITH PROPOFOL;  Surgeon: Lanelle Bal, DO;  Location: [facility]  SU[facility];  Service: Endoscopy;  Laterality: N/A;  11:15 am, asa 3    Family History  Problem Relation Age of Onset   AAA (abdominal aortic aneurysm) Mother    Sudden death Mother    Alcoholism Mother    Dementia Father    Asthma Sister    Kidney cancer Sister        had kidney removed   Diabetes Sister    Lymphoma Nephew    Colon cancer Neg Hx     Social History   Tobacco Use   Smoking status: Former    Current packs/day: 0.00    Types: Cigarettes    Quit date: 09/01/1999    Years since quitting: 24.2    Passive exposure: Never   Smokeless tobacco: Never   Tobacco comments:    smoked for approx 20 years   Vaping Use   Vaping status: Never Used  Substance Use Topics   Alcohol use: Not Currently   Drug use: Yes    Types: Marijuana    Comment: occasionally-cbd    Medications: {medication reviewed/display:3041432} Allergies as of 11/16/2023   No Known Allergies      Medication List        Accurate as of November 16, 2023 10:54 AM. If you have any questions, ask your nurse or doctor.  Aimovig 140 MG/ML Soaj Generic drug: Erenumab-aooe Inject 140 mg into the skin every 30 (thirty) days.   ascorbic acid 500 MG tablet Commonly known as: VITAMIN C Take 500 mg by mouth in the morning.   aspirin EC 81 MG tablet Take 81 mg by mouth daily. Swallow whole.   atorvastatin 80 MG tablet Commonly known as: LIPITOR Take 80 mg by mouth in the morning.   busPIRone 10 MG tablet Commonly known as: BUSPAR Take 10 mg by mouth 3 (three) times daily.   cholecalciferol 25 MCG (1000 UNIT) tablet Commonly known as: VITAMIN D3 Take 1,000 Units by mouth daily.   Dexcom G7 Receiver Devi   Dexcom G7 Sensor Misc   DULoxetine 60 MG capsule Commonly known as: CYMBALTA Take 60 mg by mouth daily.   ezetimibe 10 MG tablet Commonly known as: ZETIA Take 1 tablet (10 mg total) by mouth daily.   furosemide 40 MG tablet Commonly known as: LASIX Take 1 tablet (40 mg total)  by mouth 2 (two) times daily.   magnesium 30 MG tablet Take 30 mg by mouth 2 (two) times daily.   meloxicam 15 MG tablet Commonly known as: MOBIC Take 15 mg by mouth daily.   metoprolol tartrate 50 MG tablet Commonly known as: LOPRESSOR Take 1 tablet (50 mg total) by mouth as directed. Take 2 hours prior to cardiac CT Scan   multivitamin with minerals Tabs tablet Take 1 tablet by mouth in the morning.   ondansetron 4 MG tablet Commonly known as: ZOFRAN Take 1 tablet (4 mg total) by mouth every 8 (eight) hours as needed for nausea or vomiting.   Oxycodone HCl 10 MG Tabs Take 10 mg by mouth every 12 (twelve) hours as needed (pain).   pantoprazole 40 MG tablet Commonly known as: PROTONIX Take 1 tablet (40 mg total) by mouth 2 (two) times daily.   potassium chloride SA 20 MEQ tablet Commonly known as: KLOR-CON M Take 1 tablet (20 mEq total) by mouth 2 (two) times daily.   traZODone 50 MG tablet Commonly known as: DESYREL Take 50 mg by mouth at bedtime.   Trulicity 0.75 MG/0.5ML Soaj Generic drug: Dulaglutide Inject into the skin.   Ubrelvy 100 MG Tabs Generic drug: Ubrogepant Take 1 tablet by mouth daily as needed (migraine).         ROS:  {Review of Systems:30496}  Blood pressure 119/80, pulse (!) 58, temperature (!) 97.5 F (36.4 C), temperature source Oral, height 5\' 1"  (1.549 m), weight 222 lb (100.7 kg), SpO2 93%. Physical Exam  Results: No results found for this or any previous visit (from the past 48 hours).  No results found.   Assessment & Plan:  Sarah Porter is a 63 y.o. female with *** -*** -*** -Follow up ***  All questions were answered to the satisfaction of the patient and family***.  The risk and benefits of *** were discussed including but not limited to ***.  After careful consideration, Sarah Porter has decided to ***.    Matt Delpizzo A Sircharles Holzheimer 11/16/2023, 10:54 AM   Note: Portions of this report may have been transcribed using  voice recognition software. Every effort has been made to ensure accuracy; however, inadvertent computerized transcription errors may still be present.   Theophilus Kinds, DO [facility] [address] [place], [place] [address] [phone] (office)

## 2023-11-17 ENCOUNTER — Encounter: Payer: Self-pay | Admitting: *Deleted

## 2023-11-19 NOTE — H&P (Signed)
 [facility] History and Physical  Reason for Referral: Right upper quadrant pain Referring Physician: Ermalinda Memos, PA  Chief Complaint   New Patient (Initial Visit)     Sarah Porter is a 63 y.o. female.  HPI: Patient presents for evaluation of right upper quadrant pain.  Over the last few months, she has had vague right upper quadrant abdominal pain which seems to be worse after eating fatty meals.  Otherwise, her pain is dull and always present throughout the day.  She does complain of feeling bloated with episodes of nausea however she has not had any emesis.  She has been evaluated in the emergency department and has had negative cardiac workup.  She went to GI, who performed a HIDA scan which demonstrated biliary hyperkinesia.  She has followed with Dr. Diona Browner with cardiology, who is cleared her for any surgery.  Her past medical history significant for CHF, diabetes, anxiety, and GERD.  She takes an 81 mg aspirin daily.  She was previously on Trulicity for her diabetes, but this has been held for the past 2 weeks.  Her surgical history significant for C-section, partial hysterectomy, and tubal ligation.  Past Medical History:  Diagnosis Date   Anemia    Anxiety    Back pain    Chest pain    CHF (congestive heart failure) (HCC)    Constipation    Depression    Essential hypertension    Fibromyalgia    GERD (gastroesophageal reflux disease)    Hyperlipidemia    Joint pain    Osteoarthritis    Palpitations    Sleep apnea    SOB (shortness of breath)    Swelling of lower extremity    Type 2 diabetes mellitus (HCC)    Vertigo     Past Surgical History:  Procedure Laterality Date   BILATERAL KNEE REPAIRS     BIOPSY  07/05/2023   Procedure: BIOPSY;  Surgeon: Lanelle Bal, DO;  [facility]: [facility];  Service: Endoscopy;;   CESAREAN SECTION     x 2   COLONOSCOPY  11/04/2011   Dr. Edrick Kins; diminutive colon polyp in the rectum, otherwise normal  exam.  Pathology with tubular adenoma.  Recommended 5-year repeat.   COLONOSCOPY  07/12/2014   Dr. Edrick Kins; submucosal hemorrhages in proximal descending colon?  Prior ischemic or infectious colitis?,  Otherwise normal exam.  Recommended 5-year repeat.   COLONOSCOPY WITH PROPOFOL N/A 07/22/2021   Procedure: COLONOSCOPY WITH PROPOFOL;  Surgeon: Lanelle Bal, DO;  Location: [facility];  Service: Endoscopy;  Laterality: N/A;  8:45am   ESOPHAGOGASTRODUODENOSCOPY  07/21/2004   Dr. Edrick Kins; grade 2 erosive esophagitis at EG junction, cervical inlet patch, otherwise normal exam.   ESOPHAGOGASTRODUODENOSCOPY (EGD) WITH PROPOFOL N/A 07/05/2023   Procedure: ESOPHAGOGASTRODUODENOSCOPY (EGD) WITH PROPOFOL;  Surgeon: Lanelle Bal, DO;  Location: [facility];  Service: Endoscopy;  Laterality: N/A;  11:15 am, asa 3    Family History  Problem Relation Age of Onset   AAA (abdominal aortic aneurysm) Mother    Sudden death Mother    Alcoholism Mother    Dementia Father    Asthma Sister    Kidney cancer Sister        had kidney removed   Diabetes Sister    Lymphoma Nephew    Colon cancer Neg Hx     Social History   Tobacco Use   Smoking status: Former    Current packs/day: 0.00  Types: Cigarettes    Quit date: 09/01/1999    Years since quitting: 24.2    Passive exposure: Never   Smokeless tobacco: Never   Tobacco comments:    smoked for approx 20 years   Vaping Use   Vaping status: Never Used  Substance Use Topics   Alcohol use: Not Currently   Drug use: Yes    Types: Marijuana    Comment: occasionally-cbd    Medications: I have reviewed the patient's current medications. Allergies as of 11/16/2023   No Known Allergies      Medication List        Accurate as of November 16, 2023 10:54 AM. If you have any questions, ask your nurse or doctor.          Aimovig 140 MG/ML Soaj Generic drug: Erenumab-aooe Inject 140 mg into the skin every 30 (thirty) days.    ascorbic acid 500 MG tablet Commonly known as: VITAMIN C Take 500 mg by mouth in the morning.   aspirin EC 81 MG tablet Take 81 mg by mouth daily. Swallow whole.   atorvastatin 80 MG tablet Commonly known as: LIPITOR Take 80 mg by mouth in the morning.   busPIRone 10 MG tablet Commonly known as: BUSPAR Take 10 mg by mouth 3 (three) times daily.   cholecalciferol 25 MCG (1000 UNIT) tablet Commonly known as: VITAMIN D3 Take 1,000 Units by mouth daily.   Dexcom G7 Receiver Devi   Dexcom G7 Sensor Misc   DULoxetine 60 MG capsule Commonly known as: CYMBALTA Take 60 mg by mouth daily.   ezetimibe 10 MG tablet Commonly known as: ZETIA Take 1 tablet (10 mg total) by mouth daily.   furosemide 40 MG tablet Commonly known as: LASIX Take 1 tablet (40 mg total) by mouth 2 (two) times daily.   magnesium 30 MG tablet Take 30 mg by mouth 2 (two) times daily.   meloxicam 15 MG tablet Commonly known as: MOBIC Take 15 mg by mouth daily.   metoprolol tartrate 50 MG tablet Commonly known as: LOPRESSOR Take 1 tablet (50 mg total) by mouth as directed. Take 2 hours prior to cardiac CT Scan   multivitamin with minerals Tabs tablet Take 1 tablet by mouth in the morning.   ondansetron 4 MG tablet Commonly known as: ZOFRAN Take 1 tablet (4 mg total) by mouth every 8 (eight) hours as needed for nausea or vomiting.   Oxycodone HCl 10 MG Tabs Take 10 mg by mouth every 12 (twelve) hours as needed (pain).   pantoprazole 40 MG tablet Commonly known as: PROTONIX Take 1 tablet (40 mg total) by mouth 2 (two) times daily.   potassium chloride SA 20 MEQ tablet Commonly known as: KLOR-CON M Take 1 tablet (20 mEq total) by mouth 2 (two) times daily.   traZODone 50 MG tablet Commonly known as: DESYREL Take 50 mg by mouth at bedtime.   Trulicity 0.75 MG/0.5ML Soaj Generic drug: Dulaglutide Inject into the skin.   Ubrelvy 100 MG Tabs Generic drug: Ubrogepant Take 1 tablet by mouth  daily as needed (migraine).         ROS:  Constitutional: positive for chills, negative for fatigue and fevers Eyes: negative for visual disturbance and pain Ears, nose, mouth, throat, and face: negative for ear drainage, sore throat, and sinus problems Respiratory: positive for shortness of breath, negative for cough and wheezing Cardiovascular: positive for chest pain, negative for palpitations Gastrointestinal: positive for abdominal pain, nausea, and reflux symptoms, negative  for vomiting Genitourinary:positive for frequency, negative for dysuria Integument/breast: positive for dryness, negative for rash Hematologic/lymphatic: negative for bleeding and lymphadenopathy Musculoskeletal:positive for back pain, negative for neck pain Neurological: positive for dizziness, negative for tremors Endocrine: negative for temperature intolerance  Blood pressure 119/80, pulse (!) 58, temperature (!) 97.5 F (36.4 C), temperature source Oral, height 5\' 1"  (1.549 m), weight 222 lb (100.7 kg), SpO2 93%. Physical Exam Vitals reviewed.  Constitutional:      Appearance: Normal appearance.  HENT:     Head: Normocephalic and atraumatic.  Eyes:     Extraocular Movements: Extraocular movements intact.     Pupils: Pupils are equal, round, and reactive to light.  Cardiovascular:     Rate and Rhythm: Normal rate and regular rhythm.  Pulmonary:     Effort: Pulmonary effort is normal.     Breath sounds: Normal breath sounds.  Abdominal:     Comments: Abdomen soft, nondistended, no percussion tenderness, nontender to palpation; no rigidity, guarding, rebound tenderness; negative Murphy sign  Musculoskeletal:        General: Normal range of motion.     Cervical back: Normal range of motion.  Skin:    General: Skin is warm and dry.  Neurological:     General: No focal deficit present.     Mental Status: She is alert and oriented to person, place, and time.  Psychiatric:        Mood and Affect:  Mood normal.        Behavior: Behavior normal.     Results: HIDA scan (10/29/2023): IMPRESSION: 1.  Patent cystic and common bile ducts.   2. Elevated gallbladder ejection fraction as can be seen with gallbladder hyperkinesia.   Assessment & Plan:  Raja Caputi is a 63 y.o. female who presents for biliary colic and biliary hyperkinesia.  -I discussed the pathophysiology of gallbladder disease, and we discussed the need for surgical excision.  Given that the patient is having right upper quadrant abdominal pain and symptoms somewhat consistent with gallbladder pathology, I feel she is appropriate for cholecystectomy.  We did discuss that while some of her symptoms may be related to gallbladder disease, there is the potential that not all of her pain will be alleviated after cholecystectomy -I counseled the patient about the indication, risks and benefits of robotic assisted laparoscopic cholecystectomy.  She understands there is a very small chance for bleeding, infection, injury to normal structures (including common bile duct), conversion to open surgery, persistent symptoms, evolution of postcholecystectomy diarrhea, need for secondary interventions, anesthesia reaction, cardiopulmonary issues and other risks not specifically detailed here. I described the expected recovery, the plan for follow-up and the restrictions during the recovery phase.  All questions were answered. -Patient tentatively scheduled for surgery on 4/4 -Information provided to the patient regarding biliary dyskinesia, cholecystitis, cholecystectomy, and low-fat diet -Advised if she begins to have worsening abdominal pain, nausea, vomiting, fever, and chills, she should present to the emergency department for evaluation  All questions were answered to the satisfaction of the patient.  Note: Portions of this report may have been transcribed using voice recognition software. Every effort has been made to ensure accuracy;  however, inadvertent computerized transcription errors may still be present.   Theophilus Kinds, DO [facility] [address] [place], [place] [address] [phone] (office)

## 2023-11-22 ENCOUNTER — Ambulatory Visit: Payer: 59 | Admitting: Nurse Practitioner

## 2023-11-22 DIAGNOSIS — F332 Major depressive disorder, recurrent severe without psychotic features: Secondary | ICD-10-CM | POA: Diagnosis not present

## 2023-11-24 ENCOUNTER — Ambulatory Visit: Payer: 59 | Admitting: Gastroenterology

## 2023-11-29 ENCOUNTER — Encounter (HOSPITAL_COMMUNITY): Payer: Self-pay

## 2023-11-29 ENCOUNTER — Other Ambulatory Visit: Payer: Self-pay

## 2023-11-29 ENCOUNTER — Telehealth: Payer: Self-pay | Admitting: *Deleted

## 2023-11-29 DIAGNOSIS — M797 Fibromyalgia: Secondary | ICD-10-CM | POA: Diagnosis not present

## 2023-11-29 DIAGNOSIS — M2550 Pain in unspecified joint: Secondary | ICD-10-CM | POA: Diagnosis not present

## 2023-11-29 DIAGNOSIS — M150 Primary generalized (osteo)arthritis: Secondary | ICD-10-CM | POA: Diagnosis not present

## 2023-11-29 DIAGNOSIS — Z79891 Long term (current) use of opiate analgesic: Secondary | ICD-10-CM | POA: Diagnosis not present

## 2023-11-29 DIAGNOSIS — G894 Chronic pain syndrome: Secondary | ICD-10-CM | POA: Diagnosis not present

## 2023-11-29 NOTE — Telephone Encounter (Signed)
 Received call from [facility]~ [phone]) [phone]~ telephone.   Reports that patient is to have family or emergency contact call office once procedure has been completed. Marcial Pacas will then prescribe post operative pain management as Oxycodone 5 MG PO QID PRN x 3 days.   Requested pain management office to fax plan to [facility] for our records.

## 2023-11-30 ENCOUNTER — Encounter (HOSPITAL_COMMUNITY)
Admission: RE | Admit: 2023-11-30 | Discharge: 2023-11-30 | Disposition: A | Discharge status: Home / Self Care | Source: Ambulatory Visit | Attending: Surgery | Admitting: Surgery

## 2023-12-02 NOTE — Anesthesia Preprocedure Evaluation (Signed)
 Anesthesia Evaluation  Patient identified by MRN, date of birth, ID band Patient awake    Reviewed: Allergy & Precautions, H&P , NPO status , Patient's Chart, lab work & pertinent test results, reviewed documented beta blocker date and time   Airway Mallampati: II  TM Distance: >3 FB Neck ROM: full    Dental no notable dental hx. (+) Dental Advisory Given, Teeth Intact   Pulmonary sleep apnea , Patient abstained from smoking., former smoker   Pulmonary exam normal breath sounds clear to auscultation       Cardiovascular Exercise Tolerance: Good hypertension, +CHF  Normal cardiovascular exam Rhythm:regular Rate:Normal  Echo and cardiac CT with contrast normal   Neuro/Psych  PSYCHIATRIC DISORDERS Anxiety Depression    Vertigo  Neuromuscular disease    GI/Hepatic negative GI ROS, Neg liver ROS,GERD  ,,  Endo/Other  diabetes, Type 2  Class 3 obesity  Renal/GU negative Renal ROS  negative genitourinary   Musculoskeletal  (+) Arthritis , Osteoarthritis,  Fibromyalgia -  Abdominal   Peds  Hematology negative hematology ROS (+) Blood dyscrasia, anemia   Anesthesia Other Findings   Reproductive/Obstetrics negative OB ROS                              Anesthesia Physical Anesthesia Plan  ASA: 3  Anesthesia Plan: General   Post-op Pain Management: Dilaudid IV   Induction:   PONV Risk Score and Plan: Ondansetron, Dexamethasone and Midazolam  Airway Management Planned: Oral ETT  Additional Equipment: None  Intra-op Plan:   Post-operative Plan: Extubation in OR  Informed Consent: I have reviewed the patients History and Physical, chart, labs and discussed the procedure including the risks, benefits and alternatives for the proposed anesthesia with the patient or authorized representative who has indicated his/her understanding and acceptance.     Dental Advisory Given  Plan Discussed  with: CRNA  Anesthesia Plan Comments:         Anesthesia Quick Evaluation

## 2023-12-03 ENCOUNTER — Ambulatory Visit (HOSPITAL_BASED_OUTPATIENT_CLINIC_OR_DEPARTMENT_OTHER): Payer: Self-pay | Admitting: Anesthesiology

## 2023-12-03 ENCOUNTER — Telehealth (INDEPENDENT_AMBULATORY_CARE_PROVIDER_SITE_OTHER): Payer: Self-pay | Admitting: Surgery

## 2023-12-03 ENCOUNTER — Encounter (HOSPITAL_COMMUNITY): Payer: Self-pay | Admitting: Surgery

## 2023-12-03 ENCOUNTER — Ambulatory Visit (HOSPITAL_COMMUNITY)
Admission: RE | Admit: 2023-12-03 | Discharge: 2023-12-03 | Disposition: A | Discharge status: Home / Self Care | Attending: Surgery | Admitting: Surgery

## 2023-12-03 ENCOUNTER — Other Ambulatory Visit: Payer: Self-pay

## 2023-12-03 ENCOUNTER — Encounter (HOSPITAL_COMMUNITY)
Admission: RE | Disposition: A | Discharge status: Home / Self Care | Payer: Self-pay | Source: Home / Self Care | Attending: Surgery

## 2023-12-03 ENCOUNTER — Ambulatory Visit (HOSPITAL_COMMUNITY): Payer: Self-pay | Admitting: Anesthesiology

## 2023-12-03 DIAGNOSIS — F419 Anxiety disorder, unspecified: Secondary | ICD-10-CM | POA: Diagnosis not present

## 2023-12-03 DIAGNOSIS — E66813 Obesity, class 3: Secondary | ICD-10-CM | POA: Diagnosis not present

## 2023-12-03 DIAGNOSIS — E119 Type 2 diabetes mellitus without complications: Secondary | ICD-10-CM | POA: Insufficient documentation

## 2023-12-03 DIAGNOSIS — I110 Hypertensive heart disease with heart failure: Secondary | ICD-10-CM | POA: Diagnosis not present

## 2023-12-03 DIAGNOSIS — F32A Depression, unspecified: Secondary | ICD-10-CM | POA: Insufficient documentation

## 2023-12-03 DIAGNOSIS — K8050 Calculus of bile duct without cholangitis or cholecystitis without obstruction: Secondary | ICD-10-CM | POA: Diagnosis not present

## 2023-12-03 DIAGNOSIS — R1011 Right upper quadrant pain: Secondary | ICD-10-CM | POA: Insufficient documentation

## 2023-12-03 DIAGNOSIS — Z7982 Long term (current) use of aspirin: Secondary | ICD-10-CM | POA: Insufficient documentation

## 2023-12-03 DIAGNOSIS — G8918 Other acute postprocedural pain: Secondary | ICD-10-CM

## 2023-12-03 DIAGNOSIS — M797 Fibromyalgia: Secondary | ICD-10-CM | POA: Diagnosis not present

## 2023-12-03 DIAGNOSIS — K828 Other specified diseases of gallbladder: Secondary | ICD-10-CM | POA: Insufficient documentation

## 2023-12-03 DIAGNOSIS — Z7985 Long-term (current) use of injectable non-insulin antidiabetic drugs: Secondary | ICD-10-CM | POA: Diagnosis not present

## 2023-12-03 DIAGNOSIS — M1990 Unspecified osteoarthritis, unspecified site: Secondary | ICD-10-CM | POA: Insufficient documentation

## 2023-12-03 DIAGNOSIS — I509 Heart failure, unspecified: Secondary | ICD-10-CM | POA: Diagnosis not present

## 2023-12-03 DIAGNOSIS — G4730 Sleep apnea, unspecified: Secondary | ICD-10-CM | POA: Diagnosis not present

## 2023-12-03 DIAGNOSIS — Z87891 Personal history of nicotine dependence: Secondary | ICD-10-CM

## 2023-12-03 DIAGNOSIS — Z6841 Body Mass Index (BMI) 40.0 and over, adult: Secondary | ICD-10-CM | POA: Diagnosis not present

## 2023-12-03 DIAGNOSIS — K219 Gastro-esophageal reflux disease without esophagitis: Secondary | ICD-10-CM | POA: Insufficient documentation

## 2023-12-03 LAB — GLUCOSE, CAPILLARY
Glucose-Capillary: 128 mg/dL — ABNORMAL HIGH (ref 70–99)
Glucose-Capillary: 172 mg/dL — ABNORMAL HIGH (ref 70–99)

## 2023-12-03 SURGERY — CHOLECYSTECTOMY, ROBOT-ASSISTED, LAPAROSCOPIC
Anesthesia: General | Site: Abdomen

## 2023-12-03 MED ORDER — ACETAMINOPHEN 500 MG PO TABS
1000.0000 mg | ORAL_TABLET | Freq: Once | ORAL | Status: AC
  Administered 2023-12-03: 1000 mg via ORAL
  Filled 2023-12-03: qty 2

## 2023-12-03 MED ORDER — CHLORHEXIDINE GLUCONATE CLOTH 2 % EX PADS
6.0000 | MEDICATED_PAD | Freq: Once | CUTANEOUS | Status: AC
  Administered 2023-12-03: 6 via TOPICAL

## 2023-12-03 MED ORDER — DEXAMETHASONE SODIUM PHOSPHATE 10 MG/ML IJ SOLN
INTRAMUSCULAR | Status: DC | PRN
  Administered 2023-12-03: 5 mg via INTRAVENOUS

## 2023-12-03 MED ORDER — LACTATED RINGERS IV SOLN: INTRAVENOUS | Status: DC

## 2023-12-03 MED ORDER — FENTANYL CITRATE (PF) 100 MCG/2ML IJ SOLN
INTRAMUSCULAR | Status: DC | PRN
  Administered 2023-12-03: 25 ug via INTRAVENOUS
  Administered 2023-12-03 (×2): 50 ug via INTRAVENOUS
  Administered 2023-12-03: 25 ug via INTRAVENOUS
  Administered 2023-12-03 (×2): 50 ug via INTRAVENOUS

## 2023-12-03 MED ORDER — SODIUM CHLORIDE 0.9 % IV SOLN: 12.5000 mg | INTRAVENOUS | Status: DC | PRN

## 2023-12-03 MED ORDER — MIDAZOLAM HCL 2 MG/2ML IJ SOLN
INTRAMUSCULAR | Status: AC
  Filled 2023-12-03: qty 2

## 2023-12-03 MED ORDER — ACETAMINOPHEN 160 MG/5ML PO SOLN
960.0000 mg | Freq: Once | ORAL | Status: AC
  Filled 2023-12-03: qty 30

## 2023-12-03 MED ORDER — PHENYLEPHRINE 80 MCG/ML (10ML) SYRINGE FOR IV PUSH (FOR BLOOD PRESSURE SUPPORT)
PREFILLED_SYRINGE | INTRAVENOUS | Status: DC | PRN
  Administered 2023-12-03 (×2): 80 ug via INTRAVENOUS

## 2023-12-03 MED ORDER — SUGAMMADEX SODIUM 200 MG/2ML IV SOLN
INTRAVENOUS | Status: DC | PRN
  Administered 2023-12-03: 200 mg via INTRAVENOUS

## 2023-12-03 MED ORDER — HYDROMORPHONE HCL 1 MG/ML IJ SOLN
INTRAMUSCULAR | Status: AC
  Filled 2023-12-03: qty 0.5

## 2023-12-03 MED ORDER — OXYCODONE HCL 5 MG PO TABS: 5.0000 mg | ORAL_TABLET | Freq: Four times a day (QID) | ORAL | 0 refills | Status: DC | PRN

## 2023-12-03 MED ORDER — CHLORHEXIDINE GLUCONATE 0.12 % MT SOLN
15.0000 mL | Freq: Once | OROMUCOSAL | Status: AC
  Administered 2023-12-03: 15 mL via OROMUCOSAL
  Filled 2023-12-03: qty 15

## 2023-12-03 MED ORDER — PROPOFOL 10 MG/ML IV BOLUS
INTRAVENOUS | Status: DC | PRN
  Administered 2023-12-03: 50 mg via INTRAVENOUS
  Administered 2023-12-03: 100 mg via INTRAVENOUS

## 2023-12-03 MED ORDER — ACETAMINOPHEN 500 MG PO TABS: 1000.0000 mg | ORAL_TABLET | Freq: Four times a day (QID) | ORAL | 0 refills | Status: AC

## 2023-12-03 MED ORDER — SCOPOLAMINE 1 MG/3DAYS TD PT72
1.0000 | MEDICATED_PATCH | Freq: Once | TRANSDERMAL | Status: DC
  Administered 2023-12-03: 1.5 mg via TRANSDERMAL
  Filled 2023-12-03: qty 1

## 2023-12-03 MED ORDER — FENTANYL CITRATE (PF) 100 MCG/2ML IJ SOLN
INTRAMUSCULAR | Status: AC
  Filled 2023-12-03: qty 2

## 2023-12-03 MED ORDER — DEXMEDETOMIDINE HCL IN NACL 80 MCG/20ML IV SOLN
INTRAVENOUS | Status: DC | PRN
  Administered 2023-12-03 (×2): 8 ug via INTRAVENOUS

## 2023-12-03 MED ORDER — MIDAZOLAM HCL 2 MG/2ML IJ SOLN
INTRAMUSCULAR | Status: DC | PRN
  Administered 2023-12-03: 2 mg via INTRAVENOUS

## 2023-12-03 MED ORDER — OXYCODONE HCL 5 MG/5ML PO SOLN: 5.0000 mg | Freq: Once | ORAL | Status: DC | PRN

## 2023-12-03 MED ORDER — STERILE WATER FOR IRRIGATION IR SOLN
Status: DC | PRN
  Administered 2023-12-03: 500 mL

## 2023-12-03 MED ORDER — HYDROMORPHONE HCL 1 MG/ML IJ SOLN
INTRAMUSCULAR | Status: DC | PRN
  Administered 2023-12-03: .5 mg via INTRAVENOUS

## 2023-12-03 MED ORDER — ROCURONIUM BROMIDE 100 MG/10ML IV SOLN
INTRAVENOUS | Status: DC | PRN
  Administered 2023-12-03: 70 mg via INTRAVENOUS

## 2023-12-03 MED ORDER — ONDANSETRON HCL 4 MG/2ML IJ SOLN
INTRAMUSCULAR | Status: DC | PRN
  Administered 2023-12-03: 4 mg via INTRAVENOUS

## 2023-12-03 MED ORDER — HYDROMORPHONE HCL 1 MG/ML IJ SOLN: 0.2500 mg | INTRAMUSCULAR | Status: DC | PRN

## 2023-12-03 MED ORDER — BUPIVACAINE HCL (PF) 0.5 % IJ SOLN
INTRAMUSCULAR | Status: AC
  Filled 2023-12-03: qty 30

## 2023-12-03 MED ORDER — PROMETHAZINE HCL 25 MG/ML IJ SOLN
INTRAMUSCULAR | Status: AC
  Filled 2023-12-03: qty 1

## 2023-12-03 MED ORDER — INDOCYANINE GREEN 25 MG IV SOLR
2.5000 mg | Freq: Once | INTRAVENOUS | Status: AC
  Administered 2023-12-03: 2.5 mg via INTRAVENOUS

## 2023-12-03 MED ORDER — SODIUM CHLORIDE 0.9 % IV SOLN: INTRAVENOUS | Status: DC | PRN

## 2023-12-03 MED ORDER — OXYCODONE HCL 5 MG PO TABS
5.0000 mg | ORAL_TABLET | Freq: Once | ORAL | Status: DC | PRN
  Filled 2023-12-03: qty 1

## 2023-12-03 MED ORDER — BUPIVACAINE HCL (PF) 0.5 % IJ SOLN
INTRAMUSCULAR | Status: DC | PRN
  Administered 2023-12-03: 30 mL

## 2023-12-03 MED ORDER — LACTATED RINGERS IV SOLN
INTRAVENOUS | Status: DC
Start: 2023-12-03 — End: 2023-12-03

## 2023-12-03 MED ORDER — ORAL CARE MOUTH RINSE: 15.0000 mL | Freq: Once | OROMUCOSAL | Status: AC

## 2023-12-03 MED ORDER — SODIUM CHLORIDE 0.9 % IV SOLN
2.0000 g | INTRAVENOUS | Status: AC
  Administered 2023-12-03 (×2): 2 g via INTRAVENOUS
  Filled 2023-12-03: qty 2

## 2023-12-03 MED ORDER — INDOCYANINE GREEN 25 MG IV SOLR
INTRAVENOUS | Status: DC
Start: 2023-12-03 — End: 2023-12-03
  Filled 2023-12-03: qty 10

## 2023-12-03 MED ORDER — GLYCOPYRROLATE PF 0.2 MG/ML IJ SOSY
PREFILLED_SYRINGE | INTRAMUSCULAR | Status: DC | PRN
  Administered 2023-12-03: .1 mg via INTRAVENOUS

## 2023-12-03 SURGICAL SUPPLY — 40 items
BLADE SURG 15 STRL LF DISP TIS (BLADE) ×1 IMPLANT
CAUTERY HOOK MNPLR 1.6 DVNC XI (INSTRUMENTS) ×1 IMPLANT
CHLORAPREP W/TINT 26 (MISCELLANEOUS) ×1 IMPLANT
CLIP LIGATING HEM O LOK PURPLE (MISCELLANEOUS) ×1 IMPLANT
DEFOGGER SCOPE WARMER CLEARIFY (MISCELLANEOUS) ×1 IMPLANT
DERMABOND ADVANCED .7 DNX12 (GAUZE/BANDAGES/DRESSINGS) ×1 IMPLANT
DRAPE ARM DVNC X/XI (DISPOSABLE) ×4 IMPLANT
DRAPE COLUMN DVNC XI (DISPOSABLE) ×1 IMPLANT
ELECT REM PT RETURN 9FT ADLT (ELECTROSURGICAL) ×1 IMPLANT
ELECTRODE REM PT RTRN 9FT ADLT (ELECTROSURGICAL) ×1 IMPLANT
FORCEPS BPLR R/ABLATION 8 DVNC (INSTRUMENTS) ×1 IMPLANT
FORCEPS PROGRASP DVNC XI (FORCEP) ×1 IMPLANT
GLOVE BIOGEL PI IND STRL 6.5 (GLOVE) ×2 IMPLANT
GLOVE BIOGEL PI IND STRL 7.0 (GLOVE) ×3 IMPLANT
GLOVE SURG SS PI 6.5 STRL IVOR (GLOVE) ×2 IMPLANT
GOWN STRL REUS W/TWL LRG LVL3 (GOWN DISPOSABLE) ×3 IMPLANT
GRASPER SUT TROCAR 14GX15 (MISCELLANEOUS) ×1 IMPLANT
KIT TURNOVER KIT A (KITS) ×1 IMPLANT
MANIFOLD NEPTUNE II (INSTRUMENTS) ×1 IMPLANT
NDL HYPO 21X1.5 SAFETY (NEEDLE) ×1 IMPLANT
NDL INSUFFLATION 14GA 120MM (NEEDLE) ×1 IMPLANT
NEEDLE HYPO 21X1.5 SAFETY (NEEDLE) ×1 IMPLANT
NEEDLE INSUFFLATION 14GA 120MM (NEEDLE) ×1 IMPLANT
OBTURATOR OPTICAL STND 8 DVNC (TROCAR) ×1 IMPLANT
OBTURATOR OPTICALSTD 8 DVNC (TROCAR) ×1 IMPLANT
PACK LAP CHOLE LZT030E (CUSTOM PROCEDURE TRAY) ×1 IMPLANT
PAD ARMBOARD POSITIONER FOAM (MISCELLANEOUS) ×1 IMPLANT
PENCIL HANDSWITCHING (ELECTRODE) ×1 IMPLANT
POSITIONER HEAD 8X9X4 ADT (SOFTGOODS) ×1 IMPLANT
SEAL UNIV 5-12 XI (MISCELLANEOUS) ×4 IMPLANT
SET BASIN LINEN APH (SET/KITS/TRAYS/PACK) ×1 IMPLANT
SET TUBE SMOKE EVAC HIGH FLOW (TUBING) ×1 IMPLANT
SUT MNCRL AB 4-0 PS2 18 (SUTURE) ×2 IMPLANT
SUT VICRYL 0 UR6 27IN ABS (SUTURE) ×1 IMPLANT
SYR 30ML LL (SYRINGE) ×1 IMPLANT
SYS RETRIEVAL 5MM INZII UNIV (BASKET) ×1 IMPLANT
SYSTEM RETRIEVL 5MM INZII UNIV (BASKET) ×1 IMPLANT
TUBE CONNECTING 12X1/4 (SUCTIONS) IMPLANT
TUBE SUCTION HIGH CAP CLEAR NV (SUCTIONS) IMPLANT
WATER STERILE IRR 500ML POUR (IV SOLUTION) ×1 IMPLANT

## 2023-12-03 NOTE — Transfer of Care (Signed)
 Immediate Anesthesia Transfer of Care Note  Patient: Sarah Porter  Procedure(s) Performed: CHOLECYSTECTOMY, ROBOT-ASSISTED, LAPAROSCOPIC (Abdomen)  Patient Location: PACU  Anesthesia Type:General  Level of Consciousness: awake and drowsy  Airway & Oxygen Therapy: Patient Spontanous Breathing  Post-op Assessment: Report given to RN and Post -op Vital signs reviewed and stable  Post vital signs: Reviewed and stable  Last Vitals:  Vitals Value Taken Time  BP 134/84 12/03/23 1010  Temp 36.6 C 12/03/23 1010  Pulse 67 12/03/23 1013  Resp 14 12/03/23 1013  SpO2 100 % 12/03/23 1013  Vitals shown include unfiled device data.  Last Pain:  Vitals:   12/03/23 1610  TempSrc: Oral  PainSc: 8          Complications: No notable events documented.

## 2023-12-03 NOTE — Interval H&P Note (Signed)
 History and Physical Interval Note:  12/03/2023 7:25 AM  Sarah Porter  has presented today for surgery, with the diagnosis of BILIARY DYSKINESIA BILIARY COLIC.  The various methods of treatment have been discussed with the patient and family. After consideration of risks, benefits and other options for treatment, the patient has consented to  Procedure(s): CHOLECYSTECTOMY, ROBOT-ASSISTED, LAPAROSCOPIC (N/A) as a surgical intervention.  The patient's history has been reviewed, patient examined, no change in status, stable for surgery.  I have reviewed the patient's chart and labs.  Questions were answered to the patient's satisfaction.     Sarah Porter A Mirtie Bastyr

## 2023-12-03 NOTE — Discharge Instructions (Addendum)
 Ambulatory Surgery Discharge Instructions  General Anesthesia or Sedation Do not drive or operate heavy machinery for 24 hours.  Do not consume alcohol, tranquilizers, sleeping medications, or any non-prescribed medications for 24 hours. Do not make important decisions or sign any important papers in the next 24 hours. You should have someone with you tonight at home.  Activity  You are advised to go directly home from the hospital.  Restrict your activities and rest for a day.  Resume light activity tomorrow. No heavy lifting over 10 lbs or strenuous exercise.  Fluids and Diet Begin with clear liquids, bouillon, dry toast, soda crackers.  If not nauseated, you may go to a regular diet when you desire.  Greasy and spicy foods are not advised.  Medications  If you have not had a bowel movement in 24 hours, take 2 tablespoons over the counter Milk of mag.             You May resume your blood thinners tomorrow (Aspirin, coumadin, or other).  You are being discharged with prescriptions for Opioid/Narcotic Medications: There are some specific considerations for these medications that you should know. Opioid Meds have risks & benefits. Addiction to these meds is always a concern with prolonged use Take medication only as directed Do not drive while taking narcotic pain medication Do not crush tablets or capsules Do not use a different container than medication was dispensed in Lock the container of medication in a cool, dry place out of reach of children and pets. Opioid medication can cause addiction Do not share with anyone else (this is a felony) Do not store medications for future use. Dispose of them properly.     Disposal:  Find a [employer] household drug take back site near you.  If you can't get to a drug take back site, use the recipe below as a last resort to dispose of expired, unused or unwanted drugs. Disposal  (Do not dispose chemotherapy drugs this way, talk to your  prescribing doctor instead.) Step 1: Mix drugs (do not crush) with dirt, kitty litter, or used coffee grounds and add a small amount of water to dissolve any solid medications. Step 2: Seal drugs in plastic bag. Step 3: Place plastic bag in trash. Step 4: Take prescription container and scratch out personal information, then recycle or throw away.  Operative Site  You have a liquid bandage over your incisions, this will begin to flake off in about a week. Ok to English as a second language teacher. Keep wound clean and dry. No baths or swimming. No lifting more than 10 pounds.  Contact Information: If you have questions or concerns, please call our office, [phone], Monday- Thursday 8AM-5PM and Friday 8AM-12Noon.  If it is after hours or on the weekend, please call [facility] Main Number, [phone], and ask to speak to the surgeon on call for Dr. Robyne Peers at [facility].   SPECIFIC COMPLICATIONS TO WATCH FOR: Inability to urinate Fever over 101? F by mouth Nausea and vomiting lasting longer than 24 hours. Pain not relieved by medication ordered Swelling around the operative site Increased redness, warmth, hardness, around operative area Numbness, tingling, or cold fingers or toes Blood -soaked dressing, (small amounts of oozing may be normal) Increasing and progressive drainage from surgical area or exam site   Post Anesthesia Home Care Instructions  Activity: Get plenty of rest for the remainder of the day. A responsible individual must stay with you for 24 hours following the procedure.  For the next  24 hours, DO NOT: -Drive a car -Advertising copywriter -Drink alcoholic beverages -Take any medication unless instructed by your physician -Make any legal decisions or sign important papers.  Meals: Start with liquid foods such as gelatin or soup. Progress to regular foods as tolerated. Avoid greasy, spicy, heavy foods. If nausea and/or vomiting occur, drink only clear liquids until the nausea and/or  vomiting subsides. Call your physician if vomiting continues.  Special Instructions/Symptoms: Your throat may feel dry or sore from the anesthesia or the breathing tube placed in your throat during surgery. If this causes discomfort, gargle with warm salt water. The discomfort should disappear within 24 hours.  If you had a scopolamine patch placed behind your ear for the management of post- operative nausea and/or vomiting:  1. The medication in the patch is effective for 72 hours, after which it should be removed.  Wrap patch in a tissue and discard in the trash. Wash hands thoroughly with soap and water. 2. You may remove the patch earlier than 72 hours if you experience unpleasant side effects which may include dry mouth, dizziness or visual disturbances. 3. Avoid touching the patch. Wash your hands with soap and water after contact with the patch.

## 2023-12-03 NOTE — Telephone Encounter (Signed)
 [facility]  Called by the patient regarding pain medications.  She was supposed to get a postoperative prescription from her pain clinic, however, they never sent in the prescription.  She will run out of her current pain medication prescription without a refill.  Advised that I will send in 8 oxycodone 5 mg pills to cover her through the weekend, but she will need to reach out to her pain clinic on Monday.  Prescription sent.  All questions answered to her expressed satisfaction.  Theophilus Kinds, DO [facility] [address] [place], [place] [address] [phone] (office)

## 2023-12-03 NOTE — Anesthesia Procedure Notes (Signed)
 Procedure Name: Intubation Date/Time: 12/03/2023 7:47 AM  Performed by: Ronelle Nigh, MDPre-anesthesia Checklist: Patient identified, Emergency Drugs available, Suction available, Patient being monitored and Timeout performed Oxygen Delivery Method: Circle system utilized Preoxygenation: Pre-oxygenation with 100% oxygen Induction Type: IV induction Ventilation: Mask ventilation without difficulty Laryngoscope Size: Mac and 4 Grade View: Grade I Tube size: 7.0 mm Number of attempts: 1 Airway Equipment and Method: Stylet Placement Confirmation: ETT inserted through vocal cords under direct vision Secured at: 22 cm Tube secured with: Tape Dental Injury: Teeth and Oropharynx as per pre-operative assessment

## 2023-12-03 NOTE — Op Note (Signed)
 [facility] Operative Note  12/03/23  Preoperative Diagnosis: Biliary dyskinesia, biliary colic   Postoperative Diagnosis: Same   Procedure(s) Performed: Robotic Assisted Laparoscopic Cholecystectomy   Surgeon: Theophilus Kinds, DO   Assistants: No qualified resident was available    Anesthesia: General endotracheal   Anesthesiologist: Ronelle Nigh, MD    Specimens: Gallbladder   Estimated Blood Loss: Minimal   Blood Replacement: None    Complications: None   Wound Class: Clean contaminated   Operative Indications: The patient was found to have biliary hyperkinesia on imaging with biliary colic.  We discussed the risk of the procedure including but not limited to bleeding, infection, injury to the common bile duct, bile leak, need for further procedures, chance of subtotal cholecystectomy.   Findings:  Non-inflamed gallbladder Critical view of safety noted All clips intact at the end of the case Adequate hemostasis   Procedure: Firefly was given in the preoperative area. The patient was taken to the operating room and placed supine. General endotracheal anesthesia was induced. Intravenous antibiotics were administered per protocol.  An orogastric tube positioned to decompress the stomach. The abdomen was prepared and draped in the usual sterile fashion. A time-out was completed verifying correct patient, procedure, site, positioning, and implant(s) and/or special equipment prior to beginning this procedure.  Veress needle was placed at the infraumbilical area and insufflation was started after confirming a positive saline drop test and no immediate increase in abdominal pressure.  After reaching 15 mm, the Veress needle was removed and a 8 mm port was placed via optiview technique infraumbilical, measuring 20 mm away from the suspected position of the gallbladder.  The abdomen was inspected and no abnormalities or injuries were found.  Under direct  vision, ports were placed in the following locations in a semi curvilinear position around the target of the gallbladder: Two 8 mm ports on the patient's right each having 8cm clearance to the adjacent ports and one 8 mm port placed on the patient's left 8 cm from the umbilical port. Once ports were placed, the table was placed in the reverse Trendelenburg position with the right side up. The Xi platform was brought into the operative field and docked to the ports successfully.  An endoscope was placed through the umbilical port, prograsp through the most lateral right port, fenestrated bipolar to the port just right of the umbilicus, and then a hook cautery in the left port.  The dome of the gallbladder was grasped with prograsp and retracted over the dome of the liver. Adhesions between the gallbladder and omentum, duodenum and transverse colon were lysed via hook cautery. The infundibulum was grasped with the fenestrated grasper and retracted toward the right lower quadrant. This maneuver exposed Calot's triangle. Firefly was used throughout the dissection to ensure safe visualization of the cystic duct.  The peritoneum overlying the gallbladder infundibulum was then dissected and the cystic duct and cystic artery identified.  Critical view of safety with the liver bed clearly visible behind the duct and artery with no additional structures noted.  The cystic duct and cystic artery were doubly clipped and divided close to the gallbladder.    The gallbladder was then dissected from its peritoneal and liver bed attachments by electrocautery. Hemostasis was checked prior to removing the hook cautery.  The [employer] was undocked and moved out of the field.  A 5mm Endo Catch bag was then placed through the umbilical port and the gallbladder was removed.  The gallbladder was passed off the  table as a specimen. There was no evidence of bleeding from the gallbladder fossa or cystic artery or leakage of the bile from the  cystic duct stump. The umbilical port site closed with a 0 vicryl with a PMI needle.  The abdomen was desufflated and secondary trocars were removed under direct vision. No bleeding was noted. Incisions were localized with marcaine.  All skin incisions were closed with subcuticular sutures of 4-0 monocryl and dermabond.   Final inspection revealed acceptable hemostasis. All counts were correct at the end of the case. The patient was awakened from anesthesia and extubated without complication. The OG tube was removed.  The patient went to the PACU in stable condition.   Theophilus Kinds, DO [facility] [address] [place], [place] [address] [phone] (office)

## 2023-12-03 NOTE — Progress Notes (Signed)
 No IC Fr[employer] in [employer].  Called [facility] at [phone] to obtain for surgery.  AC to assist in obtaining this medication.

## 2023-12-03 NOTE — Progress Notes (Signed)
 [facility]  Spoke with the patient's significant other in the consultation room.  I explained that she tolerated the procedure without difficulty.  She has dissolvable stitches under the skin with overlying skin glue.  This will flake off in 10 to 14 days.  She will reach out to her pain doctor about getting an additional narcotic prescription postoperatively.  I also want her taking scheduled Tylenol.  The patient will follow-up with me in 2 weeks for phone follow-up.  All questions were answered to his expressed satisfaction.  Theophilus Kinds, DO [facility] [address] [place], [place] [address] [phone] (office)

## 2023-12-03 NOTE — Anesthesia Postprocedure Evaluation (Signed)
 Anesthesia Post Note  Patient: Sarah Porter  Procedure(s) Performed: CHOLECYSTECTOMY, ROBOT-ASSISTED, LAPAROSCOPIC (Abdomen)  Patient location during evaluation: PACU Anesthesia Type: General Level of consciousness: awake and alert Pain management: pain level controlled Vital Signs Assessment: post-procedure vital signs reviewed and stable Respiratory status: spontaneous breathing, nonlabored ventilation, respiratory function stable and patient connected to nasal cannula oxygen Cardiovascular status: blood pressure returned to baseline and stable Postop Assessment: no apparent nausea or vomiting Anesthetic complications: no   There were no known notable events for this encounter.   Last Vitals:  Vitals:   12/03/23 1030 12/03/23 1045  BP: (!) 155/103 (!) 156/96  Pulse: 71 67  Resp: (!) 9 15  Temp:    SpO2: 100% 99%    Last Pain:  Vitals:   12/03/23 1045  TempSrc:   PainSc: Asleep                 Emmitt Matthews L Tobi Groesbeck

## 2023-12-06 LAB — SURGICAL PATHOLOGY

## 2023-12-16 ENCOUNTER — Encounter: Admitting: Surgery

## 2023-12-22 ENCOUNTER — Ambulatory Visit (INDEPENDENT_AMBULATORY_CARE_PROVIDER_SITE_OTHER): Admitting: Surgery

## 2023-12-22 DIAGNOSIS — Z09 Encounter for follow-up examination after completed treatment for conditions other than malignant neoplasm: Secondary | ICD-10-CM

## 2023-12-22 NOTE — Progress Notes (Unsigned)
 [facility]  I am calling the patient for post operative evaluation. This is not a billable encounter as it is under the global charges for the surgery.  The patient had a robotic assisted laparoscopic cholecystectomy on 4/4. The patient reports that she is doing ok. The are tolerating a diet***, having good pain control***, and having regular Bm.  The incisions are healing well. The patient has *** concerns.   Pathology: A. GALLBLADDER, CHOLECYSTECTOMY:  - Gallbladder with no specific histopathologic changes   Will see the patient PRN.   Lidia Reels, DO [facility] [address] [place], [place] [address] [phone] (office)

## 2023-12-23 DIAGNOSIS — E785 Hyperlipidemia, unspecified: Secondary | ICD-10-CM | POA: Diagnosis not present

## 2023-12-23 DIAGNOSIS — E119 Type 2 diabetes mellitus without complications: Secondary | ICD-10-CM | POA: Diagnosis not present

## 2023-12-23 DIAGNOSIS — E669 Obesity, unspecified: Secondary | ICD-10-CM | POA: Diagnosis not present

## 2023-12-24 LAB — LAB REPORT - SCANNED
A1c: 7.4
Albumin, Urine POC: 0.2
Creatinine, POC: 77 mg/dL
EGFR: 99
Free T4: 6 ng/dL
Microalb Creat Ratio: 3
TSH: 1.14 (ref 0.41–5.90)

## 2023-12-28 ENCOUNTER — Ambulatory Visit (INDEPENDENT_AMBULATORY_CARE_PROVIDER_SITE_OTHER): Admitting: Surgery

## 2023-12-28 ENCOUNTER — Encounter: Payer: Self-pay | Admitting: Surgery

## 2023-12-28 VITALS — BP 122/79 | HR 62 | Temp 97.8°F | Resp 14 | Ht 61.0 in | Wt 225.0 lb

## 2023-12-28 DIAGNOSIS — Z09 Encounter for follow-up examination after completed treatment for conditions other than malignant neoplasm: Secondary | ICD-10-CM

## 2023-12-31 NOTE — Progress Notes (Unsigned)
 Referring Provider: [facility] Primary Care Physician:  [facility] Primary GI Physician: Dr. Mordechai April  No chief complaint on file.   HPI:   Sarah Porter is a 63 y.o. female with history of anxiety, depression, HTN, HLD, arthritis, fibromyalgia, back pain, diabetes, heart failure with preserved ejection fraction, GERD, Barrett's esophagus, dysphagia, constipation in the setting of opioids, presenting today for follow-up of epigastric pain, early satiety, nausea.  Last seen in the office 10/27/2023 reporting she felt that she did not eat anything.  Lack of appetite, associated nausea, epigastric pain with bloating/distention.  Continued with intermittent chest pain that radiated into her neck, jaw, back.  Bowels moving well with MiraLAX.  Report all of her upper GI symptoms have been going on since November, but worsened in the last couple of months.  She had been started on oxycodone  about 6 months prior and Trulicity the latter part of December or early January.  She was taking meloxicam daily.  She had prior EGD in November 2024 that showed mild reflux esophagitis, short segment Barrett's esophagus and gastritis with biopsies negative for H. pylori.  Noted recent labs remarkable for slight elevation of alk phos at 124.  Differentials include side effect of Trulicity, possible gastroparesis, biliary dyskinesia, worsening gastritis.  Recommended HIDA, talk to PCP about discontinuing GLP-1 agonist, hold oxycodone  and meloxicam, recheck HFP, lipase, GGT.  Continued on pantoprazole  40 mg twice daily.  Labs markable for alk phos 127, ALT 35.  Other LFTs, GGT, lipase within normal limits.  HIDA showed mild biliary hyperkinesia.  Recommended seeing general surgery to discuss role of cholecystectomy.  Discussed that I could not guarantee this would resolve her symptoms.  She ultimately underwent cholecystectomy 12/03/2023.  No significant abnormalities on pathology.  Today:     Past Medical History:  Diagnosis Date   Anemia    Anxiety    Back pain    Chest pain    CHF (congestive heart failure) (HCC)    Constipation    Depression    Essential hypertension    Fibromyalgia    GERD (gastroesophageal reflux disease)    Hyperlipidemia    Joint pain    Osteoarthritis    Palpitations    Sleep apnea    SOB (shortness of breath)    Swelling of lower extremity    Type 2 diabetes mellitus (HCC)    Vertigo     Past Surgical History:  Procedure Laterality Date   BILATERAL KNEE REPAIRS     BIOPSY  07/05/2023   Procedure: BIOPSY;  Surgeon: Vinetta Greening, DO;  [facility]: [facility];  Service: Endoscopy;;   CESAREAN SECTION     x 2   COLONOSCOPY  11/04/2011   Dr. Lotus Round; diminutive colon polyp in the rectum, otherwise normal exam.  Pathology with tubular adenoma.  Recommended 5-year repeat.   COLONOSCOPY  07/12/2014   Dr. Lotus Round; submucosal hemorrhages in proximal descending colon?  Prior ischemic or infectious colitis?,  Otherwise normal exam.  Recommended 5-year repeat.   COLONOSCOPY WITH PROPOFOL  N/A 07/22/2021   Procedure: COLONOSCOPY WITH PROPOFOL ;  Surgeon: Vinetta Greening, DO;  Location: [facility];  Service: Endoscopy;  Laterality: N/A;  8:45am   ESOPHAGOGASTRODUODENOSCOPY  07/21/2004   Dr. Lotus Round; grade 2 erosive esophagitis at EG junction, cervical inlet patch, otherwise normal exam.   ESOPHAGOGASTRODUODENOSCOPY (EGD) WITH PROPOFOL  N/A 07/05/2023   Procedure: ESOPHAGOGASTRODUODENOSCOPY (EGD) WITH PROPOFOL ;  Surgeon: Vinetta Greening, DO;  Location:  [facility];  Service: Endoscopy;  Laterality: N/A;  11:15 am, asa 3    Current Outpatient Medications  Medication Sig Dispense Refill   AIMOVIG 140 MG/ML SOAJ Inject 140 mg into the skin every 30 (thirty) days.     aspirin EC 81 MG tablet Take 81 mg by mouth daily. Swallow whole.     atorvastatin (LIPITOR) 80 MG tablet Take 80 mg by mouth in the morning.     bisacodyl  (DULCOLAX) 5 MG EC tablet Take 5 mg by mouth daily as needed for moderate constipation.     busPIRone (BUSPAR) 10 MG tablet Take 10 mg by mouth 3 (three) times daily.     cholecalciferol (VITAMIN D3) 25 MCG (1000 UNIT) tablet Take 1,000 Units by mouth daily.     Continuous Glucose Receiver (DEXCOM G7 RECEIVER) DEVI      Continuous Glucose Sensor (DEXCOM G7 SENSOR) MISC      DULoxetine (CYMBALTA) 60 MG capsule Take 60 mg by mouth daily.     ELDERBERRY PO Take 1 tablet by mouth daily.     ezetimibe  (ZETIA ) 10 MG tablet Take 1 tablet (10 mg total) by mouth daily. 90 tablet 1   famotidine (PEPCID) 20 MG tablet Take 20 mg by mouth daily.     furosemide  (LASIX ) 40 MG tablet Take 1 tablet (40 mg total) by mouth 2 (two) times daily. 60 tablet 1   Magnesium 250 MG TABS Take 250 mg by mouth daily.     Multiple Vitamin (MULTIVITAMIN WITH MINERALS) TABS tablet Take 1 tablet by mouth in the morning.     ondansetron  (ZOFRAN ) 4 MG tablet Take 1 tablet (4 mg total) by mouth every 8 (eight) hours as needed for nausea or vomiting. 30 tablet 1   oxyCODONE  (ROXICODONE ) 5 MG immediate release tablet Take 1 tablet (5 mg total) by mouth every 6 (six) hours as needed for severe pain (pain score 7-10). 8 tablet 0   Oxycodone  HCl 10 MG TABS Take 10 mg by mouth in the morning and at bedtime.     pantoprazole  (PROTONIX ) 40 MG tablet Take 1 tablet (40 mg total) by mouth 2 (two) times daily. 60 tablet 11   potassium chloride  SA (KLOR-CON  M) 20 MEQ tablet Take 1 tablet (20 mEq total) by mouth 2 (two) times daily. 60 tablet 1   traZODone (DESYREL) 50 MG tablet Take 75-100 mg by mouth at bedtime as needed for sleep.     UBRELVY 100 MG TABS Take 100 mg by mouth daily as needed (migraine).     vitamin C (ASCORBIC ACID) 500 MG tablet Take 500 mg by mouth in the morning.     No current facility-administered medications for this visit.    Allergies as of 01/03/2024   (No Known Allergies)    Family History  Problem Relation  Age of Onset   AAA (abdominal aortic aneurysm) Mother    Sudden death Mother    Alcoholism Mother    Dementia Father    Asthma Sister    Kidney cancer Sister        had kidney removed   Diabetes Sister    Lymphoma Nephew    Colon cancer Neg Hx     Social History   Socioeconomic History   Marital status: Significant Other    Spouse name: Not on file   Number of children: Not on file   Years of education: Not on file   Highest education level: Not on file  Occupational History   Not on file  Tobacco Use   Smoking status: Former    Current packs/day: 0.00    Types: Cigarettes    Quit date: 09/01/1999    Years since quitting: 24.3    Passive exposure: Never   Smokeless tobacco: Never   Tobacco comments:    smoked for approx 20 years   Vaping Use   Vaping status: Never Used  Substance and Sexual Activity   Alcohol use: Not Currently   Drug use: Yes    Types: Marijuana    Comment: occasionally-cbd   Sexual activity: Yes    Birth control/protection: Post-menopausal  Other Topics Concern   Not on file  Social History Narrative   Are you right handed or left handed? Right   Are you currently employed ? Retired   What is your current occupation?   Do you live at home alone? NO    Who lives with you?    What type of home do you live in: 1 story or 2 story? 1       Social Drivers of Corporate investment banker Strain: Low Risk  (12/16/2022)   Overall Financial Resource Strain (CARDIA)    Difficulty of Paying Living Expenses: Not very hard  Food Insecurity: No Food Insecurity (12/16/2022)   Hunger Vital Sign    Worried About Running Out of Food in the Last Year: Never true    Ran Out of Food in the Last Year: Never true  Transportation Needs: No Transportation Needs (12/16/2022)   PRAPARE - Administrator, Civil Service (Medical): No    Lack of Transportation (Non-Medical): No  Physical Activity: Sufficiently Active (12/16/2022)   Exercise Vital Sign     Days of Exercise per Week: 3 days    Minutes of Exercise per Session: 60 min  Stress: Stress Concern Present (12/16/2022)   [employer] of Occupational Health - Occupational Stress Questionnaire    Feeling of Stress : Very much  Social Connections: Socially Integrated (12/16/2022)   Social Connection and Isolation Panel [NHANES]    Frequency of Communication with Friends and Family: More than three times a week    Frequency of Social Gatherings with Friends and Family: Three times a week    Attends Religious Services: More than 4 times per year    Active Member of Clubs or Organizations: Yes    Attends Banker Meetings: More than 4 times per year    Marital Status: Living with partner    Review of Systems: Gen: Denies fever, chills, anorexia. Denies fatigue, weakness, weight loss.  CV: Denies chest pain, palpitations, syncope, peripheral edema, and claudication. Resp: Denies dyspnea at rest, cough, wheezing, coughing up blood, and pleurisy. GI: Denies vomiting blood, jaundice, and fecal incontinence.   Denies dysphagia or odynophagia. Derm: Denies rash, itching, dry skin Psych: Denies depression, anxiety, memory loss, confusion. No homicidal or suicidal ideation.  Heme: Denies bruising, bleeding, and enlarged lymph nodes.  Physical Exam: There were no vitals taken for this visit. General:   Alert and oriented. No distress noted. Pleasant and cooperative.  Head:  Normocephalic and atraumatic. Eyes:  Conjuctiva clear without scleral icterus. Heart:  S1, S2 present without murmurs appreciated. Lungs:  Clear to auscultation bilaterally. No wheezes, rales, or rhonchi. No distress.  Abdomen:  +BS, soft, non-tender and non-distended. No rebound or guarding. No HSM or masses noted. Msk:  Symmetrical without gross deformities. Normal posture. Extremities:  Without edema. Neurologic:  Alert and  oriented x4 Psych:  Normal mood and affect.    Assessment:     Plan:   ***   Shana Daring, PA-C [facility] 01/03/2024

## 2024-01-02 NOTE — Progress Notes (Unsigned)
 [facility] Note   HPI:  [name] presents to clinic for post-op follow-up s/p robotic assisted laparoscopic cholecystectomy on 4/4. ***  Review of Systems:  All other review of systems: otherwise negative   Vital Signs:  BP 122/79   Pulse 62   Temp 97.8 F (36.6 C) (Oral)   Resp 14   Ht 5\' 1"  (1.549 m)   Wt 225 lb (102.1 kg)   SpO2 96%   BMI 42.51 kg/m    Physical Exam:  Physical Exam  Laboratory studies: None   Imaging:  None   Pathology: A. GALLBLADDER, CHOLECYSTECTOMY:  - Gallbladder with no specific histopathologic changes   Assessment:  63 y.o. yo Female with ***.  Plan:  - ***  - *** - *** - Follow up  All of the above recommendations were discussed with the patient and patient's family, and all of patient's and family's questions were answered to their expressed satisfaction.  Note: Portions of this report may have been transcribed using voice recognition software. Every effort has been made to ensure accuracy; however, inadvertent computerized transcription errors may still be present.   Lidia Reels, DO [facility] [address] [place], [place] [address] [phone] (office)

## 2024-01-03 ENCOUNTER — Encounter: Payer: Self-pay | Admitting: *Deleted

## 2024-01-03 ENCOUNTER — Encounter: Payer: Self-pay | Admitting: Gastroenterology

## 2024-01-03 ENCOUNTER — Ambulatory Visit (INDEPENDENT_AMBULATORY_CARE_PROVIDER_SITE_OTHER): Admitting: Gastroenterology

## 2024-01-03 VITALS — BP 128/78 | HR 67 | Temp 96.9°F | Ht 61.0 in | Wt 228.6 lb

## 2024-01-03 DIAGNOSIS — R1013 Epigastric pain: Secondary | ICD-10-CM | POA: Diagnosis not present

## 2024-01-03 DIAGNOSIS — K2100 Gastro-esophageal reflux disease with esophagitis, without bleeding: Secondary | ICD-10-CM

## 2024-01-03 DIAGNOSIS — R7401 Elevation of levels of liver transaminase levels: Secondary | ICD-10-CM | POA: Diagnosis not present

## 2024-01-03 DIAGNOSIS — R112 Nausea with vomiting, unspecified: Secondary | ICD-10-CM

## 2024-01-03 DIAGNOSIS — R748 Abnormal levels of other serum enzymes: Secondary | ICD-10-CM | POA: Diagnosis not present

## 2024-01-03 DIAGNOSIS — R1310 Dysphagia, unspecified: Secondary | ICD-10-CM

## 2024-01-03 DIAGNOSIS — R110 Nausea: Secondary | ICD-10-CM

## 2024-01-03 DIAGNOSIS — K5903 Drug induced constipation: Secondary | ICD-10-CM

## 2024-01-03 DIAGNOSIS — K219 Gastro-esophageal reflux disease without esophagitis: Secondary | ICD-10-CM | POA: Diagnosis not present

## 2024-01-03 DIAGNOSIS — R6881 Early satiety: Secondary | ICD-10-CM | POA: Diagnosis not present

## 2024-01-03 DIAGNOSIS — K5900 Constipation, unspecified: Secondary | ICD-10-CM

## 2024-01-03 MED ORDER — ESOMEPRAZOLE MAGNESIUM 40 MG PO CPDR: 40.0000 mg | DELAYED_RELEASE_CAPSULE | Freq: Two times a day (BID) | ORAL | 3 refills | Status: DC

## 2024-01-03 NOTE — Patient Instructions (Addendum)
 We will arrange for you to have a gastric emptying study and swallow study.   Stop pantoprazole .   Start Nexium 40 mh twice a day 30 minutes before breakfast and before dinner.   Take pepcid 20 mg at bedtime.   Follow a GERD diet:  Avoid fried, fatty, greasy, spicy, citrus foods. Avoid caffeine and carbonated beverages. Avoid chocolate. Try eating 4-6 small meals a day rather than 3 large meals. Do not eat within 3 hours of laying down. Prop head of bed up on wood or bricks to create a 6 inch incline.   Please stop ibuprofen and avoid all NSAIDs.   You can take tylenol . No more than 2000 mg per day.    Start Linzess 145 mcg daily, first thing in the morning on an empty stomach.    I will see you back in 6 weeks or sooner if needed.   Shana Daring, PA-C [facility]

## 2024-01-05 ENCOUNTER — Ambulatory Visit (HOSPITAL_COMMUNITY)
Admission: RE | Admit: 2024-01-05 | Discharge: 2024-01-05 | Disposition: A | Discharge status: Home / Self Care | Source: Ambulatory Visit | Attending: Gastroenterology | Admitting: Gastroenterology

## 2024-01-05 DIAGNOSIS — Q394 Esophageal web: Secondary | ICD-10-CM | POA: Diagnosis not present

## 2024-01-05 DIAGNOSIS — R1310 Dysphagia, unspecified: Secondary | ICD-10-CM | POA: Insufficient documentation

## 2024-01-05 DIAGNOSIS — K219 Gastro-esophageal reflux disease without esophagitis: Secondary | ICD-10-CM | POA: Diagnosis not present

## 2024-01-05 DIAGNOSIS — K449 Diaphragmatic hernia without obstruction or gangrene: Secondary | ICD-10-CM | POA: Diagnosis not present

## 2024-01-07 ENCOUNTER — Ambulatory Visit (HOSPITAL_COMMUNITY)
Admission: RE | Admit: 2024-01-07 | Discharge: 2024-01-07 | Disposition: A | Discharge status: Home / Self Care | Source: Ambulatory Visit | Attending: Gastroenterology | Admitting: Gastroenterology

## 2024-01-07 DIAGNOSIS — R112 Nausea with vomiting, unspecified: Secondary | ICD-10-CM | POA: Insufficient documentation

## 2024-01-07 DIAGNOSIS — R140 Abdominal distension (gaseous): Secondary | ICD-10-CM | POA: Diagnosis not present

## 2024-01-07 DIAGNOSIS — R6881 Early satiety: Secondary | ICD-10-CM | POA: Insufficient documentation

## 2024-01-07 MED ORDER — TECHNETIUM TC 99M SULFUR COLLOID
2.0000 | Freq: Once | INTRAVENOUS | Status: AC | PRN
Start: 2024-01-07 — End: 2024-01-07
  Administered 2024-01-07: 2 via ORAL

## 2024-01-10 ENCOUNTER — Encounter: Payer: Self-pay | Admitting: *Deleted

## 2024-01-11 ENCOUNTER — Ambulatory Visit: Payer: Self-pay | Admitting: *Deleted

## 2024-01-11 ENCOUNTER — Encounter: Payer: Self-pay | Admitting: *Deleted

## 2024-01-30 ENCOUNTER — Other Ambulatory Visit: Payer: Self-pay | Admitting: Nurse Practitioner

## 2024-01-31 DIAGNOSIS — G894 Chronic pain syndrome: Secondary | ICD-10-CM | POA: Diagnosis not present

## 2024-01-31 DIAGNOSIS — Z79891 Long term (current) use of opiate analgesic: Secondary | ICD-10-CM | POA: Diagnosis not present

## 2024-01-31 DIAGNOSIS — Z79899 Other long term (current) drug therapy: Secondary | ICD-10-CM | POA: Diagnosis not present

## 2024-01-31 DIAGNOSIS — M150 Primary generalized (osteo)arthritis: Secondary | ICD-10-CM | POA: Diagnosis not present

## 2024-01-31 DIAGNOSIS — M2550 Pain in unspecified joint: Secondary | ICD-10-CM | POA: Diagnosis not present

## 2024-01-31 DIAGNOSIS — M797 Fibromyalgia: Secondary | ICD-10-CM | POA: Diagnosis not present

## 2024-02-03 NOTE — Progress Notes (Unsigned)
 Referring Provider: [facility],* Primary Care Physician:  France Ina, MD Primary GI Physician: Dr. Mordechai April  Chief Complaint  Patient presents with   Follow-up    Follow up. No problems     HPI:   Sarah Porter is a 63 y.o. female  with history of anxiety, depression, HTN, HLD, arthritis, fibromyalgia, back pain, diabetes, heart failure with preserved ejection fraction, GERD, Barrett's esophagus, dysphagia, constipation in the setting of opioids, presenting today for follow-up of  GERD, upper abdominal pain, constipation.   Initially seen in February 2025 reporting significant early satiety, lack of appetite, nausea, epigastric pain since November 2024, but worse in the last couple months.  Oxycodone  started 6 months prior and Trulicity started in the latter part of December/early January.  Also taking meloxicam daily. Prior EGD in November 2024 that showed mild reflux esophagitis, short segment Barrett's esophagus and gastritis with biopsies negative for H. pylori.   Labs in March: alk phos 127, ALT 35. Other LFTs, GGT, lipase within normal limits.   HIDA March 2025 with mild biliary hyperkinesia.   Cholecystectomy 12/03/23.  No significant abnormalities on path.   Last seen in the office 01/03/2024.  Continued with upper abdominal discomfort, postprandial bloating, no change since cholecystectomy.  Chest pain with reflux, waking up in the middle of the night not being able to breathe.  Intermittent nausea/vomiting.  Intermittent dysphagia, primarily to bread.  Reported prior negative evaluation for sleep apnea.  She was taking pantoprazole  twice daily, not sure if she was taking Pepcid at bedtime.  Had been on omeprazole  in the past.  Stopped Trulicity about 4 weeks prior.  Still taking oxycodone  2-3 times a day.  Constipation continues to be uncontrolled taking MiraLAX daily and 1 Colace daily.  Has just picked up Linzess 145 mcg.  Recommended GES, BPE, stop pantoprazole  and  start Nexium  40 mg twice daily, Pepcid 20 mg at bedtime, avoid NSAIDs, start Linzess 145 mcg daily as prescribed by pain management.  BPE 01/05/2024 with small cervical esophageal web.  Small hiatal hernia.  Offered repeat EGD, but patient states she would not be able to schedule until September.  Gastric emptying study was normal.   Today: Reports she is feeling better overall, but for the last week, feels like she starting to go backwards again.  Stomach a starting to feel raw again in the epigastric area.  She is having some intermittent breakthrough reflux though not nearly as bad as it was.  Has not woken up in the middle the night gasping in a while.  Reports some degree of acid reflux every other day.  Consuming very little coffee, no soda, rare fatty foods.  Has intermittent nausea, but no vomiting.  Taking Zofran  as needed.  Continues with solid food dysphagia.  Feels items are stuck in her mid esophagus.  Abdominal bloating seems to have resolved.  Constipation improved with Linzess 145 mcg daily.  She is having bowel movements about every other day.  No BRBPR or melena.   Elevated LFTs:  States she just had blood work with Kary Pages.  No ETOH.  Supplements: Elderberry; tumeric, vitamin C, magnesium , vitamin D     Also having several other issues including hot flashes with sweating, fatigue, sensation of restless leg syndrome, but all over, itchy skin, unintentional weight gain.  She has noticed some swelling in her hands as her rings are tight.  She is taking Lasix  as prescribed.  Feels that her PCP is not helping her.  They have not done any workup for the symptoms.  She has been referred to endocrinology and is scheduled to see them in September.   Past Medical History:  Diagnosis Date   Anemia    Anxiety    Back pain    Chest pain    CHF (congestive heart failure) (HCC)    Constipation    Depression    Essential hypertension    Fibromyalgia    GERD  (gastroesophageal reflux disease)    Hyperlipidemia    Joint pain    Osteoarthritis    Palpitations    Sleep apnea    SOB (shortness of breath)    Swelling of lower extremity    Type 2 diabetes mellitus (HCC)    Vertigo     Past Surgical History:  Procedure Laterality Date   BILATERAL KNEE REPAIRS     BIOPSY  07/05/2023   Procedure: BIOPSY;  Surgeon: Vinetta Greening, DO;  [facility];  Service: Endoscopy;;   CESAREAN SECTION     x 2   COLONOSCOPY  11/04/2011   Dr. Lotus Round; diminutive colon polyp in the rectum, otherwise normal exam.  Pathology with tubular adenoma.  Recommended 5-year repeat.   COLONOSCOPY  07/12/2014   Dr. Lotus Round; submucosal hemorrhages in proximal descending colon?  Prior ischemic or infectious colitis?,  Otherwise normal exam.  Recommended 5-year repeat.   COLONOSCOPY WITH PROPOFOL  N/A 07/22/2021   Procedure: COLONOSCOPY WITH PROPOFOL ;  Surgeon: Vinetta Greening, DO;  [facility];  Service: Endoscopy;  Laterality: N/A;  8:45am   ESOPHAGOGASTRODUODENOSCOPY  07/21/2004   Dr. Lotus Round; grade 2 erosive esophagitis at EG junction, cervical inlet patch, otherwise normal exam.   ESOPHAGOGASTRODUODENOSCOPY (EGD) WITH PROPOFOL  N/A 07/05/2023   Procedure: ESOPHAGOGASTRODUODENOSCOPY (EGD) WITH PROPOFOL ;  Surgeon: Vinetta Greening, DO;  [facility];  Service: Endoscopy;  Laterality: N/A;  11:15 am, asa 3    Current Outpatient Medications  Medication Sig Dispense Refill   aspirin EC 81 MG tablet Take 81 mg by mouth daily. Swallow whole.     atorvastatin (LIPITOR) 80 MG tablet Take 80 mg by mouth in the morning.     bisacodyl (DULCOLAX) 5 MG EC tablet Take 5 mg by mouth daily as needed for moderate constipation.     busPIRone (BUSPAR) 10 MG tablet Take 10 mg by mouth 3 (three) times daily.     cholecalciferol (VITAMIN D3) 25 MCG (1000 UNIT) tablet Take 1,000 Units by mouth daily.     DULoxetine (CYMBALTA) 60 MG capsule  Take 60 mg by mouth daily.     ELDERBERRY PO Take 1 tablet by mouth daily.     esomeprazole  (NEXIUM ) 40 MG capsule Take 1 capsule (40 mg total) by mouth 2 (two) times daily before a meal. 60 capsule 3   ezetimibe  (ZETIA ) 10 MG tablet Take 1 tablet (10 mg total) by mouth daily. 90 tablet 1   famotidine (PEPCID) 20 MG tablet Take 20 mg by mouth daily.     furosemide  (LASIX ) 40 MG tablet Take 1 tablet by mouth twice daily 180 tablet 3   LINZESS 145 MCG CAPS capsule Take 145 mcg by mouth every morning.     Magnesium  250 MG TABS Take 250 mg by mouth daily.     Multiple Vitamin (MULTIVITAMIN WITH MINERALS) TABS tablet Take 1 tablet by mouth in the morning.     ondansetron  (ZOFRAN ) 4 MG tablet Take 1 tablet (4 mg total) by  mouth every 8 (eight) hours as needed for nausea or vomiting. 30 tablet 1   oxyCODONE  (ROXICODONE ) 5 MG immediate release tablet Take 1 tablet (5 mg total) by mouth every 6 (six) hours as needed for severe pain (pain score 7-10). 8 tablet 0   Oxycodone  HCl 10 MG TABS Take 10 mg by mouth in the morning and at bedtime.     potassium chloride  SA (KLOR-CON  M) 20 MEQ tablet Take 1 tablet by mouth twice daily 180 tablet 3   pregabalin (LYRICA) 50 MG capsule Take 50 mg by mouth 2 (two) times daily.     traZODone (DESYREL) 50 MG tablet Take 75-100 mg by mouth at bedtime as needed for sleep.     UBRELVY 100 MG TABS Take 100 mg by mouth daily as needed (migraine).     vitamin C (ASCORBIC ACID) 500 MG tablet Take 500 mg by mouth in the morning.     AIMOVIG 140 MG/ML SOAJ Inject 140 mg into the skin every 30 (thirty) days. (Patient not taking: Reported on 02/04/2024)     Continuous Glucose Receiver (DEXCOM G7 RECEIVER) DEVI  (Patient not taking: Reported on 01/03/2024)     Continuous Glucose Sensor (DEXCOM G7 SENSOR) MISC  (Patient not taking: Reported on 01/03/2024)     No current facility-administered medications for this visit.    Allergies as of 02/04/2024   (No Known Allergies)    Family  History  Problem Relation Age of Onset   AAA (abdominal aortic aneurysm) Mother    Sudden death Mother    Alcoholism Mother    Dementia Father    Asthma Sister    Kidney cancer Sister        had kidney removed   Diabetes Sister    Lymphoma Nephew    Colon cancer Neg Hx     Social History   Socioeconomic History   Marital status: Significant Other    Spouse name: Not on file   Number of children: Not on file   Years of education: Not on file   Highest education level: Not on file  Occupational History   Not on file  Tobacco Use   Smoking status: Former    Current packs/day: 0.00    Types: Cigarettes    Quit date: 09/01/1999    Years since quitting: 24.4    Passive exposure: Never   Smokeless tobacco: Never   Tobacco comments:    smoked for approx 20 years   Vaping Use   Vaping status: Never Used  Substance and Sexual Activity   Alcohol use: Not Currently   Drug use: Yes    Types: Marijuana    Comment: occasionally-cbd   Sexual activity: Yes    Birth control/protection: Post-menopausal  Other Topics Concern   Not on file  Social History Narrative   Are you right handed or left handed? Right   Are you currently employed ? Retired   What is your current occupation?   Do you live at home alone? NO    Who lives with you?    What type of home do you live in: 1 story or 2 story? 1       Social Drivers of Corporate investment banker Strain: Low Risk  (12/16/2022)   Overall Financial Resource Strain (CARDIA)    Difficulty of Paying Living Expenses: Not very hard  Food Insecurity: No Food Insecurity (12/16/2022)   Hunger Vital Sign    Worried About Running Out of Food  in the Last Year: Never true    Ran Out of Food in the Last Year: Never true  Transportation Needs: No Transportation Needs (12/16/2022)   PRAPARE - Administrator, Civil Service (Medical): No    Lack of Transportation (Non-Medical): No  Physical Activity: Sufficiently Active (12/16/2022)    Exercise Vital Sign    Days of Exercise per Week: 3 days    Minutes of Exercise per Session: 60 min  Stress: Stress Concern Present (12/16/2022)   [employer] of Occupational Health - Occupational Stress Questionnaire    Feeling of Stress : Very much  Social Connections: Socially Integrated (12/16/2022)   Social Connection and Isolation Panel [NHANES]    Frequency of Communication with Friends and Family: More than three times a week    Frequency of Social Gatherings with Friends and Family: Three times a week    Attends Religious Services: More than 4 times per year    Active Member of Clubs or Organizations: Yes    Attends Engineer, structural: More than 4 times per year    Marital Status: Living with partner    Review of Systems: Gen: Denies fever, chills, cold or flulike symptoms, presyncope, syncope. CV: Denies chest pain, palpitations. Resp: Denies dyspnea, cough.  GI: See HPI Heme: See HPI  Physical Exam: BP 120/75 (BP Location: Right Arm, Patient Position: Sitting, Cuff Size: Large)   Pulse 68   Temp (!) 96.9 F (36.1 C) (Temporal)   Ht 5\' 1"  (1.549 m)   Wt 233 lb 9.6 oz (106 kg)   BMI 44.14 kg/m  General:   Alert and oriented. No distress noted. Pleasant and cooperative.  Head:  Normocephalic and atraumatic. Eyes:  Conjuctiva clear without scleral icterus. Heart:  S1, S2 present without murmurs appreciated. Lungs:  Clear to auscultation bilaterally. No wheezes, rales, or rhonchi. No distress.  Abdomen:  +BS, soft, non-tender and non-distended. No rebound or guarding. No HSM or masses noted. Msk:  Symmetrical without gross deformities. Normal posture. Extremities:  With 1-2+ bilateral LE pitting edema.  Neurologic:  Alert and  oriented x4 Psych:  Normal mood and affect.    Assessment:  63 y.o. female  with history of anxiety, depression, HTN, HLD, arthritis, fibromyalgia, back pain, diabetes, heart failure with preserved ejection fraction, GERD,  Barrett's esophagus, dysphagia, constipation in the setting of opioids, presenting today for follow-up of  GERD, upper abdominal pain, constipation.   GERD: Chronic. Improved with Nexium  40 mg twice daily and Pepcid at bedtime, but still with some degree of breakthrough every other day.  Previously on omeprazole  and pantoprazole  twice daily.  She is trying to follow a GERD diet.  Unfortunately, she is gaining weight unintentionally which is likely contributing to her uncontrolled GERD.  EGD in April 2025 with reflux esophagitis and Barrett's.  I will try her on Voquezna to see if this will provide better control for her. Samples provided today.   Epigastric pain: Described as a raw feeling.  Likely related to uncontrolled GERD/gastritis.  EGD in November 2020 for with mild reflux esophagitis, short segment Barrett's esophagus, gastritis with biopsies negative for H. pylori.  HIDA with mild biliary hyperkinesia in March 2025 s/p cholecystectomy April 2025 with no improvement in her symptoms.  Gastric emptying study normal in May 2025.   Dysphagia: Solid food dysphagia.  EGD in November 2020 for showing mild reflux esophagitis, short segment Barrett's esophagus.  No dilation performed at that time.  Barium pill esophagram 01/05/2024  with small cervical esophageal web.  Will arrange repeat EGD with esophageal dilation.  Opioid-induced constipation: Improved blood Linzess 145 mcg daily, but still skipping some days between bowel movements.  Will try increasing Linzess to 290 mcg daily.    Elevated LFTs: Mild elevation of ALT in 2024 that normalized in January/February 2025, but last labs in March 2025 with ALT slightly elevated at 35, alk phos slightly elevated at 127.  GGT within normal limits.  Hep B and hep C serologies negative and iron panel within normal limits in 2024.  Known history of hepatic steatosis which is likely contributing to elevated ALT.  Denies alcohol.  She is taking turmeric and I have  advised that she stop this.  Reports recent labs with PCP.  I will request these for you.  If LFTs are persistently elevated, will pursue additional workup.  Unintentional weight gain/hot flashes:  Unclear if these 2 symptoms are related.  TSH in February within normal limits.  Recommended follow-up with OB/GYN and endocrine for further evaluation.  Also recommended follow-up with cardiology for management of diuretics as she has 1-2+ bilateral LE edema on exam today and also reports rings fitting tightly on her fingers.  Fluid retention likely contributing to weight gain as well.  LE edema:  Follow-up with cardiology on diuretic management.    Plan:  Request recent labs from PCP. Arrange EGD with esophageal dilation with Dr. Mordechai April in September as patient is leaving for [place]  this weekend and will not return until September. The risks, benefits, and alternatives have been discussed with the patient in detail. The patient states understanding and desires to proceed.  ASA 3 Stop Nexium . Start Voquezna 20 mg daily.  Samples provided.  Requested progress report in 1 week. Continue Pepcid at bedtime. Reinforced GERD diet/lifestyle.  Separate written instructions provided on AVS. Increase Linzess to 290 mcg daily.  Samples provided.  Requested progress report in 1 week. Stop turmeric. No more than 1800 to 2000 mg of sodium per day. Follow-up with cardiology on LE edema. Follow-up with endocrinology and OB/GYN for evaluation of weight gain and hot flashes. Follow-up in our office after EGD.  Advised that we could try to do a virtual visit if insurance allows while she is in [place]  for the next couple of months if needed.   Shana Daring, PA-C [facility] 02/04/2024

## 2024-02-04 ENCOUNTER — Telehealth: Payer: Self-pay | Admitting: Cardiology

## 2024-02-04 ENCOUNTER — Encounter: Payer: Self-pay | Admitting: Gastroenterology

## 2024-02-04 ENCOUNTER — Ambulatory Visit (INDEPENDENT_AMBULATORY_CARE_PROVIDER_SITE_OTHER): Admitting: Gastroenterology

## 2024-02-04 VITALS — BP 120/75 | HR 68 | Temp 96.9°F | Ht 61.0 in | Wt 233.6 lb

## 2024-02-04 DIAGNOSIS — K2100 Gastro-esophageal reflux disease with esophagitis, without bleeding: Secondary | ICD-10-CM

## 2024-02-04 DIAGNOSIS — R232 Flushing: Secondary | ICD-10-CM | POA: Diagnosis not present

## 2024-02-04 DIAGNOSIS — K219 Gastro-esophageal reflux disease without esophagitis: Secondary | ICD-10-CM | POA: Diagnosis not present

## 2024-02-04 DIAGNOSIS — R1310 Dysphagia, unspecified: Secondary | ICD-10-CM

## 2024-02-04 DIAGNOSIS — R1013 Epigastric pain: Secondary | ICD-10-CM | POA: Diagnosis not present

## 2024-02-04 DIAGNOSIS — R635 Abnormal weight gain: Secondary | ICD-10-CM | POA: Diagnosis not present

## 2024-02-04 DIAGNOSIS — K5903 Drug induced constipation: Secondary | ICD-10-CM | POA: Diagnosis not present

## 2024-02-04 DIAGNOSIS — R7989 Other specified abnormal findings of blood chemistry: Secondary | ICD-10-CM

## 2024-02-04 DIAGNOSIS — R600 Localized edema: Secondary | ICD-10-CM | POA: Diagnosis not present

## 2024-02-04 DIAGNOSIS — T402X5A Adverse effect of other opioids, initial encounter: Secondary | ICD-10-CM

## 2024-02-04 MED ORDER — FUROSEMIDE 40 MG PO TABS: 40.0000 mg | ORAL_TABLET | Freq: Two times a day (BID) | ORAL | 6 refills | Status: DC

## 2024-02-04 NOTE — Patient Instructions (Addendum)
 I will request recent labs from you primary care doctor and let you know if we need to check anything else.   Stop tumeric as this can contribute to liver enzyme elevation.   It is possible oxycodone  is contributing to itching.    For constipation, lets try increasing Linzess to 290 mcg.  I am providing you with samples today.  Please send me a MyChart message in 1 week to let me know how this works for you.  For reflux, stop Nexium  and try Voquezna 20 mg daily.  We are providing you with samples.  Please send me a MyChart message in 1 week to let me know how this works for you.  You can continue to take Pepcid at bedtime.   Follow a GERD diet:  Avoid fried, fatty, greasy, spicy, citrus foods. Avoid caffeine and carbonated beverages. Avoid chocolate. Try eating 4-6 small meals a day rather than 3 large meals. Do not eat within 3 hours of laying down. Prop head of bed up on wood or bricks to create a 6 inch incline.  We will work on getting you scheduled for an upper endoscopy with Dr. Mordechai April.  As we discussed, you do have swelling in your lower extremities today.  I recommend that you follow-up with your cardiologist regarding management of your fluid pills.  Also recommend that you follow a strict low-sodium diet.  No more than 1800 to 2000 mg of sodium per day.  You will need to look at the nutrition label on the back of everything you are consuming to see how much sodium is per serving and how many servings you are eating.  I recommend that you follow-up with endocrinology and OB/GYN for further evaluation of weight gain and hot flashes.   Let's plan to follow-up after your endoscopy. If you need me while in [place] , we can see about doing  virtual visit.    I hope you have a great trip to [place] !   Shana Daring, [facility]astroenterology

## 2024-02-04 NOTE — Telephone Encounter (Signed)
 Spoke with patient - states she saw her GI doc today & she suggested she speak with us  about her fluid.  Weight this morning was 233lb - up 5 lbs from where GI had her last.  Last weight with us  was 222 on 11/05/2023.  States that she has been trying to lose weight lately but not much luck.  States GI doc noticed that her ankles were swollen & states she did notice that her rings were tighter on her fingers as well.  No chest pain.  Does note som SOB with activity and some dizziness.  BP running 120/75  68.  Her Lasix  was prescribed 40mg  twice a day, but states she has on been doing daily for the last several weeks.  Stated she had not felt she needed it as much previously.  States that she will be leaving on Sunday to go to [place]  and will not be back till September.

## 2024-02-04 NOTE — Telephone Encounter (Signed)
 Pt c/o medication issue:  1. Name of Medication: furosemide  (LASIX ) 40 MG tablet   2. How are you currently taking this medication (dosage and times per day)? As written   3. Are you having a reaction (difficulty breathing--STAT)? No   4. What is your medication issue? Pts Gastro doctor suggested that this dosage be increased due to swelling in the pts feet, legs and hands. Please advise

## 2024-02-04 NOTE — Telephone Encounter (Signed)
 Patient notified and verbalized understanding.   Will send refill for her Lasix  to [place] as requested.   [facility]  [address] [place], [place]  [address] Phone:  [phone]

## 2024-02-15 ENCOUNTER — Ambulatory Visit: Admitting: Student

## 2024-03-27 DIAGNOSIS — M797 Fibromyalgia: Secondary | ICD-10-CM | POA: Diagnosis not present

## 2024-03-27 DIAGNOSIS — M150 Primary generalized (osteo)arthritis: Secondary | ICD-10-CM | POA: Diagnosis not present

## 2024-03-27 DIAGNOSIS — G894 Chronic pain syndrome: Secondary | ICD-10-CM | POA: Diagnosis not present

## 2024-03-27 DIAGNOSIS — Z79891 Long term (current) use of opiate analgesic: Secondary | ICD-10-CM | POA: Diagnosis not present

## 2024-03-27 DIAGNOSIS — Z79899 Other long term (current) drug therapy: Secondary | ICD-10-CM | POA: Diagnosis not present

## 2024-03-27 DIAGNOSIS — M2550 Pain in unspecified joint: Secondary | ICD-10-CM | POA: Diagnosis not present

## 2024-03-28 ENCOUNTER — Other Ambulatory Visit: Payer: Self-pay | Admitting: Cardiology

## 2024-04-07 ENCOUNTER — Telehealth: Payer: Self-pay | Admitting: *Deleted

## 2024-04-07 NOTE — Telephone Encounter (Signed)
 Spoke with pt. Scheduled for EGD/ED with Dr. Cindie ASA 3 in sept on 9/3. Aware will send instructions to her and call back with pre-op appt.

## 2024-04-10 ENCOUNTER — Encounter: Payer: Self-pay | Admitting: *Deleted

## 2024-04-20 ENCOUNTER — Other Ambulatory Visit (HOSPITAL_COMMUNITY): Payer: Self-pay | Admitting: Obstetrics & Gynecology

## 2024-04-20 DIAGNOSIS — Z1231 Encounter for screening mammogram for malignant neoplasm of breast: Secondary | ICD-10-CM

## 2024-04-24 ENCOUNTER — Encounter: Payer: Self-pay | Admitting: Podiatry

## 2024-04-24 ENCOUNTER — Ambulatory Visit (INDEPENDENT_AMBULATORY_CARE_PROVIDER_SITE_OTHER): Admitting: Podiatry

## 2024-04-24 DIAGNOSIS — Z8739 Personal history of other diseases of the musculoskeletal system and connective tissue: Secondary | ICD-10-CM | POA: Insufficient documentation

## 2024-04-24 DIAGNOSIS — Z79899 Other long term (current) drug therapy: Secondary | ICD-10-CM | POA: Diagnosis not present

## 2024-04-24 DIAGNOSIS — M150 Primary generalized (osteo)arthritis: Secondary | ICD-10-CM | POA: Diagnosis not present

## 2024-04-24 DIAGNOSIS — M797 Fibromyalgia: Secondary | ICD-10-CM | POA: Diagnosis not present

## 2024-04-24 DIAGNOSIS — R519 Headache, unspecified: Secondary | ICD-10-CM | POA: Insufficient documentation

## 2024-04-24 DIAGNOSIS — M19071 Primary osteoarthritis, right ankle and foot: Secondary | ICD-10-CM

## 2024-04-24 DIAGNOSIS — M109 Gout, unspecified: Secondary | ICD-10-CM | POA: Insufficient documentation

## 2024-04-24 DIAGNOSIS — G894 Chronic pain syndrome: Secondary | ICD-10-CM | POA: Diagnosis not present

## 2024-04-24 DIAGNOSIS — M2550 Pain in unspecified joint: Secondary | ICD-10-CM | POA: Diagnosis not present

## 2024-04-24 MED ORDER — BETAMETHASONE SOD PHOS & ACET 6 (3-3) MG/ML IJ SUSP
3.0000 mg | Freq: Once | INTRAMUSCULAR | Status: AC
  Administered 2024-04-24: 3 mg via INTRA_ARTICULAR
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
## 2024-04-26 NOTE — Patient Instructions (Signed)
 Sarah Porter  04/26/2024     @PREFPERIOPPHARMACY @   Your procedure is scheduled on  05/03/2024.   Report to [facility] at  0600 A.M.   Call this number if you have problems the morning of surgery:  [phone]  If you experience any cold or flu symptoms such as cough, fever, chills, shortness of breath, etc. between now and your scheduled surgery, please notify us  at the above number.   Remember:        DO NOT take any medications for diabetes the night before your procedure.   Follow the diet instructions given to you by the office.   You may drink clear liquids until 0330 am on 05/03/2024.   Clear liquids allowed are:                    Water , Juice (No red color; non-citric and without pulp; diabetics please choose diet or no sugar options), Carbonated beverages (diabetics please choose diet or no sugar options), Clear Tea (No creamer, milk, or cream, including half & half and powdered creamer), Black Coffee Only (No creamer, milk or cream, including half & half and powdered creamer), and Clear Sports drink (No red color; diabetics please choose diet or no sugar options)    Take these medicines the morning of surgery with A SIP OF WATER        buspirone, duloxetine, esomeprazole , famotidine, dilaudid (if needed), pregabalin, ubrelvy (if needed), zofran  (if needed).     Do not wear jewelry, make-up or nail polish, including gel polish,  artificial nails, or any other type of covering on natural nails (fingers and  toes).  Do not wear lotions, powders, or perfumes, or deodorant.  Do not shave 48 hours prior to surgery.  Men may shave face and neck.  Do not bring valuables to the hospital.  [facility] is not responsible for any belongings or valuables.  Contacts, dentures or bridgework may not be worn into surgery.  Leave your suitcase in the car.  After surgery it may be brought to your room.  For patients admitted to the hospital, discharge time will be determined by  your treatment team.  Patients discharged the day of surgery will not be allowed to drive home and must have someone with them for 24 hours.    Special instructions:   DO NOT smoke tobacco or vape for 24 hours before your procedure.  Please read over the following fact sheets that you were given. Anesthesia Post-op Instructions and Care and Recovery After Surgery      Upper Endoscopy, Adult, Care After After the procedure, it is common to have a sore throat. It is also common to have: Mild stomach pain or discomfort. Bloating. Nausea. Follow these instructions at home: The instructions below may help you care for yourself at home. Your health care provider may give you more instructions. If you have questions, ask your health care provider. If you were given a sedative during the procedure, it can affect you for several hours. Do not drive or operate machinery until your health care provider says that it is safe. If you will be going home right after the procedure, plan to have a responsible adult: Take you home from the hospital or clinic. You will not be allowed to drive. Care for you for the time you are told. Follow instructions from your health care provider about what you may eat and drink. Return to your normal activities as  told by your health care provider. Ask your health care provider what activities are safe for you. Take over-the-counter and prescription medicines only as told by your health care provider. Contact a health care provider if you: Have a sore throat that lasts longer than one day. Have trouble swallowing. Have a fever. Get help right away if you: Vomit blood or your vomit looks like coffee grounds. Have bloody, black, or tarry stools. Have a very bad sore throat or you cannot swallow. Have difficulty breathing or very bad pain in your chest or abdomen. These symptoms may be an emergency. Get help right away. Call 911. Do not wait to see if the symptoms  will go away. Do not drive yourself to the hospital. Summary After the procedure, it is common to have a sore throat, mild stomach discomfort, bloating, and nausea. If you were given a sedative during the procedure, it can affect you for several hours. Do not drive until your health care provider says that it is safe. Follow instructions from your health care provider about what you may eat and drink. Return to your normal activities as told by your health care provider. This information is not intended to replace advice given to you by your health care provider. Make sure you discuss any questions you have with your health care provider. Document Revised: 11/26/2021 Document Reviewed: 11/26/2021 Elsevier Patient Education  2024 [employer]Esophageal Dilatation Esophageal dilatation, or dilation, is done to stretch a blocked or narrowed part of your esophagus. The esophagus is the part of your body that moves food from your mouth to your stomach. You may need to have it stretched if: You have a lot of scar tissue and it makes it hard or painful to swallow. You have cancer of the esophagus. There's a problem with how food moves through your esophagus. In some cases, you may need to have this procedure done more than once. Tell a health care provider about: Any allergies you have. All medicines you're taking, including vitamins, herbs, eye drops, creams, and over-the-counter medicines. Any problems you or family members have had with anesthesia. Any bleeding problems you have. Any surgeries you've had. Any medical conditions you have. Whether you're pregnant or may be pregnant. What are the risks? Your health care provider will talk with you about risks. These may include: Bleeding. A hole or tear in your esophagus. What happens before the procedure? When to stop eating and drinking Follow instructions from your provider about what you may eat and drink. These may include: 8 hours  before your procedure Stop eating most foods. Do not eat meat, fried foods, or fatty foods. Eat only light foods, such as toast or crackers. All liquids are okay except energy drinks and alcohol. 6 hours before your procedure Stop eating. Drink only clear liquids, such as water , clear fruit juice, black coffee, plain tea, and sports drinks. Do not drink energy drinks or alcohol. 2 hours before your procedure Stop drinking all liquids. You may be allowed to take medicines with small sips of water . If you don't follow your provider's instructions, your procedure may be delayed or canceled. Medicines Ask your provider about: Changing or stopping your regular medicines. These include any diabetes medicines or blood thinners you take. Taking medicines such as aspirin and ibuprofen. These medicines can thin your blood. Do not take them unless your provider tells you to. Taking over-the-counter medicines, vitamins, herbs, and supplements. General instructions If you'll be going home right after the procedure,  plan to have a responsible adult: Take you home from the hospital or clinic. You won't be allowed to drive. Care for you for the time you're told. What happens during the procedure? You may be given: A sedative. This helps you relax. Anesthesia. This keeps you from feeling pain. It will numb certain areas of your body. The stretching may be done with: Simple dilators. These are tools put in your esophagus to stretch it. Guide wires. These wires are put in using a tube called an endoscope. A dilator is put over the wires to stretch your esophagus. Then the wires are taken out. A balloon. The balloon is on the end of a tube. It's inflated to stretch your esophagus. The procedure may vary among providers and hospitals. What can I expect after the procedure? Your blood pressure, heart rate, breathing rate, and blood oxygen level will be monitored until you leave the hospital or clinic. Your  throat may feel sore and numb. This will get better over time. You won't be allowed to eat or drink until your throat is no longer numb. You may be able to go home when you can: Drink. Pee. Sit on the edge of the bed without nausea or dizziness. Follow these instructions at home: Activity If you were given a sedative during the procedure, it can affect you for several hours. Do not drive or operate machinery until your provider says it's safe. Return to your normal activities as told by your provider. Ask your provider what activities are safe for you. General instructions Take over-the-counter and prescription medicines only as told by your provider. Follow instructions from your provider about what you may eat and drink. Do not use any products that contain nicotine or tobacco. These products include cigarettes, chewing tobacco, and vaping devices, such as e-cigarettes. If you need help quitting, ask your provider. Keep all follow-up visits. Your provider will make sure the procedure worked. Where to find more information American Society for Gastrointestinal Endoscopy (ASGE): asge.org Contact a health care provider if: You have trouble swallowing. You have a fever. Your pain doesn't get better with medicine. Get help right away if: You have chest pain. You have trouble breathing. You vomit blood. Your poop is: Black. Tarry. Bloody. These symptoms may be an emergency. Get help right away. Call 911. Do not wait to see if the symptoms will go away. Do not drive yourself to the hospital. This information is not intended to replace advice given to you by your health care provider. Make sure you discuss any questions you have with your health care provider. Document Revised: 11/13/2022 Document Reviewed: 11/13/2022 Elsevier Patient Education  2024 Else[employer] Inc.General Anesthesia, Adult, Care After The following information offers guidance on how to care for yourself after your  procedure. Your health care provider may also give you more specific instructions. If you have problems or questions, contact your health care provider. What can I expect after the procedure? After the procedure, it is common for people to: Have pain or discomfort at the IV site. Have nausea or vomiting. Have a sore throat or hoarseness. Have trouble concentrating. Feel cold or chills. Feel weak, sleepy, or tired (fatigue). Have soreness and body aches. These can affect parts of the body that were not involved in surgery. Follow these instructions at home: For the time period you were told by your health care provider:  Rest. Do not participate in activities where you could fall or become injured. Do not drive or use  machinery. Do not drink alcohol. Do not take sleeping pills or medicines that cause drowsiness. Do not make important decisions or sign legal documents. Do not take care of children on your own. General instructions Drink enough fluid to keep your urine pale yellow. If you have sleep apnea, surgery and certain medicines can increase your risk for breathing problems. Follow instructions from your health care provider about wearing your sleep device: Anytime you are sleeping, including during daytime naps. While taking prescription pain medicines, sleeping medicines, or medicines that make you drowsy. Return to your normal activities as told by your health care provider. Ask your health care provider what activities are safe for you. Take over-the-counter and prescription medicines only as told by your health care provider. Do not use any products that contain nicotine or tobacco. These products include cigarettes, chewing tobacco, and vaping devices, such as e-cigarettes. These can delay incision healing after surgery. If you need help quitting, ask your health care provider. Contact a health care provider if: You have nausea or vomiting that does not get better with  medicine. You vomit every time you eat or drink. You have pain that does not get better with medicine. You cannot urinate or have bloody urine. You develop a skin rash. You have a fever. Get help right away if: You have trouble breathing. You have chest pain. You vomit blood. These symptoms may be an emergency. Get help right away. Call 911. Do not wait to see if the symptoms will go away. Do not drive yourself to the hospital. Summary After the procedure, it is common to have a sore throat, hoarseness, nausea, vomiting, or to feel weak, sleepy, or fatigue. For the time period you were told by your health care provider, do not drive or use machinery. Get help right away if you have difficulty breathing, have chest pain, or vomit blood. These symptoms may be an emergency. This information is not intended to replace advice given to you by your health care provider. Make sure you discuss any questions you have with your health care provider. Document Revised: 11/14/2021 Document Reviewed: 11/14/2021 Elsevier Patient Education  2024 [employer].

## 2024-04-28 ENCOUNTER — Other Ambulatory Visit: Payer: Self-pay

## 2024-04-28 ENCOUNTER — Encounter (HOSPITAL_COMMUNITY)
Admission: RE | Admit: 2024-04-28 | Discharge: 2024-04-28 | Disposition: A | Discharge status: Home / Self Care | Source: Ambulatory Visit | Attending: Internal Medicine | Admitting: Internal Medicine

## 2024-04-28 ENCOUNTER — Encounter (HOSPITAL_COMMUNITY): Payer: Self-pay

## 2024-04-28 VITALS — BP 139/63 | HR 62 | Resp 18 | Ht 61.0 in | Wt 233.7 lb

## 2024-04-28 DIAGNOSIS — Z01812 Encounter for preprocedural laboratory examination: Secondary | ICD-10-CM | POA: Diagnosis not present

## 2024-04-28 DIAGNOSIS — D649 Anemia, unspecified: Secondary | ICD-10-CM | POA: Diagnosis not present

## 2024-04-28 DIAGNOSIS — E119 Type 2 diabetes mellitus without complications: Secondary | ICD-10-CM | POA: Insufficient documentation

## 2024-04-28 LAB — CBC WITH DIFFERENTIAL/PLATELET
Abs Immature Granulocytes: 0.06 K/uL (ref 0.00–0.07)
Basophils Absolute: 0 K/uL (ref 0.0–0.1)
Basophils Relative: 0 %
Eosinophils Absolute: 0.3 K/uL (ref 0.0–0.5)
Eosinophils Relative: 3 %
HCT: 41.7 % (ref 36.0–46.0)
Hemoglobin: 13.5 g/dL (ref 12.0–15.0)
Immature Granulocytes: 1 %
Lymphocytes Relative: 36 %
Lymphs Abs: 3.6 K/uL (ref 0.7–4.0)
MCH: 27.9 pg (ref 26.0–34.0)
MCHC: 32.4 g/dL (ref 30.0–36.0)
MCV: 86.2 fL (ref 80.0–100.0)
Monocytes Absolute: 1.1 K/uL — ABNORMAL HIGH (ref 0.1–1.0)
Monocytes Relative: 11 %
Neutro Abs: 5 K/uL (ref 1.7–7.7)
Neutrophils Relative %: 49 %
Platelets: UNDETERMINED K/uL (ref 150–400)
RBC: 4.84 MIL/uL (ref 3.87–5.11)
RDW: 13.9 % (ref 11.5–15.5)
WBC: 10.1 K/uL (ref 4.0–10.5)
nRBC: 0 % (ref 0.0–0.2)

## 2024-04-28 LAB — BASIC METABOLIC PANEL WITH GFR
Anion gap: 13 (ref 5–15)
BUN: 27 mg/dL — ABNORMAL HIGH (ref 8–23)
CO2: 21 mmol/L — ABNORMAL LOW (ref 22–32)
Calcium: 8.9 mg/dL (ref 8.9–10.3)
Chloride: 103 mmol/L (ref 98–111)
Creatinine, Ser: 0.77 mg/dL (ref 0.44–1.00)
GFR, Estimated: >60 mL/min (ref >60)
Glucose, Bld: 121 mg/dL — ABNORMAL HIGH (ref 70–99)
Potassium: 4.6 mmol/L (ref 3.5–5.1)
Sodium: 137 mmol/L (ref 135–145)

## 2024-05-01 DIAGNOSIS — Z20822 Contact with and (suspected) exposure to covid-19: Secondary | ICD-10-CM | POA: Diagnosis not present

## 2024-05-02 NOTE — Telephone Encounter (Signed)
 Pt states she was around someone that tested positive for covid this past weekend. She says she went to urgent care and got tested, results were negative. Pt states she is not having any symptoms and feels fine. Advised pt that she can proceed with her procedure.

## 2024-05-02 NOTE — Anesthesia Preprocedure Evaluation (Signed)
 Anesthesia Evaluation  Patient identified by MRN, date of birth, ID band Patient awake    Reviewed: Allergy & Precautions, H&P , NPO status , Patient's Chart, lab work & pertinent test results, reviewed documented beta blocker date and time   Airway Mallampati: II  TM Distance: >3 FB Neck ROM: full    Dental no notable dental hx. (+) Dental Advisory Given, Teeth Intact   Pulmonary sleep apnea , Patient abstained from smoking., former smoker   Pulmonary exam normal breath sounds clear to auscultation       Cardiovascular Exercise Tolerance: Good hypertension, +CHF  Normal cardiovascular exam Rhythm:regular Rate:Normal  Echo and cardiac CT with contrast normal   Neuro/Psych  PSYCHIATRIC DISORDERS Anxiety Depression    Vertigo  Neuromuscular disease    GI/Hepatic negative GI ROS, Neg liver ROS,GERD  ,,  Endo/Other  diabetes, Type 2  Class 3 obesity  Renal/GU negative Renal ROS  negative genitourinary   Musculoskeletal  (+) Arthritis , Osteoarthritis,  Fibromyalgia -  Abdominal   Peds  Hematology negative hematology ROS (+) Blood dyscrasia, anemia   Anesthesia Other Findings   Reproductive/Obstetrics negative OB ROS                              Anesthesia Physical Anesthesia Plan  ASA: 3  Anesthesia Plan: General   Post-op Pain Management: Minimal or no pain anticipated   Induction: Intravenous  PONV Risk Score and Plan: Propofol  infusion  Airway Management Planned: Nasal Cannula and Natural Airway  Additional Equipment: None  Intra-op Plan:   Post-operative Plan:   Informed Consent: I have reviewed the patients History and Physical, chart, labs and discussed the procedure including the risks, benefits and alternatives for the proposed anesthesia with the patient or authorized representative who has indicated his/her understanding and acceptance.     Dental Advisory  Given  Plan Discussed with: CRNA  Anesthesia Plan Comments:         Anesthesia Quick Evaluation

## 2024-05-03 ENCOUNTER — Ambulatory Visit (HOSPITAL_COMMUNITY): Payer: Self-pay | Admitting: Anesthesiology

## 2024-05-03 ENCOUNTER — Encounter (HOSPITAL_COMMUNITY): Payer: Self-pay | Admitting: Internal Medicine

## 2024-05-03 ENCOUNTER — Encounter (HOSPITAL_COMMUNITY)
Admission: RE | Disposition: A | Discharge status: Home / Self Care | Payer: Self-pay | Source: Home / Self Care | Attending: Internal Medicine

## 2024-05-03 ENCOUNTER — Other Ambulatory Visit: Payer: Self-pay

## 2024-05-03 ENCOUNTER — Ambulatory Visit (HOSPITAL_COMMUNITY)
Admission: RE | Admit: 2024-05-03 | Discharge: 2024-05-03 | Disposition: A | Discharge status: Home / Self Care | Attending: Internal Medicine | Admitting: Internal Medicine

## 2024-05-03 DIAGNOSIS — K2970 Gastritis, unspecified, without bleeding: Secondary | ICD-10-CM

## 2024-05-03 DIAGNOSIS — R1310 Dysphagia, unspecified: Secondary | ICD-10-CM

## 2024-05-03 DIAGNOSIS — F32A Depression, unspecified: Secondary | ICD-10-CM | POA: Insufficient documentation

## 2024-05-03 DIAGNOSIS — E119 Type 2 diabetes mellitus without complications: Secondary | ICD-10-CM | POA: Diagnosis not present

## 2024-05-03 DIAGNOSIS — K2289 Other specified disease of esophagus: Secondary | ICD-10-CM | POA: Diagnosis not present

## 2024-05-03 DIAGNOSIS — K449 Diaphragmatic hernia without obstruction or gangrene: Secondary | ICD-10-CM | POA: Insufficient documentation

## 2024-05-03 DIAGNOSIS — I509 Heart failure, unspecified: Secondary | ICD-10-CM | POA: Insufficient documentation

## 2024-05-03 DIAGNOSIS — I10 Essential (primary) hypertension: Secondary | ICD-10-CM | POA: Diagnosis not present

## 2024-05-03 DIAGNOSIS — Z87891 Personal history of nicotine dependence: Secondary | ICD-10-CM | POA: Diagnosis not present

## 2024-05-03 DIAGNOSIS — K219 Gastro-esophageal reflux disease without esophagitis: Secondary | ICD-10-CM | POA: Insufficient documentation

## 2024-05-03 DIAGNOSIS — I110 Hypertensive heart disease with heart failure: Secondary | ICD-10-CM | POA: Diagnosis not present

## 2024-05-03 DIAGNOSIS — R1013 Epigastric pain: Secondary | ICD-10-CM | POA: Diagnosis not present

## 2024-05-03 DIAGNOSIS — G4730 Sleep apnea, unspecified: Secondary | ICD-10-CM | POA: Insufficient documentation

## 2024-05-03 DIAGNOSIS — F419 Anxiety disorder, unspecified: Secondary | ICD-10-CM | POA: Insufficient documentation

## 2024-05-03 DIAGNOSIS — K2960 Other gastritis without bleeding: Secondary | ICD-10-CM | POA: Diagnosis not present

## 2024-05-03 DIAGNOSIS — M797 Fibromyalgia: Secondary | ICD-10-CM | POA: Diagnosis not present

## 2024-05-03 HISTORY — PX: ESOPHAGOGASTRODUODENOSCOPY: SHX5428

## 2024-05-03 HISTORY — PX: ESOPHAGEAL DILATION: SHX303

## 2024-05-03 LAB — GLUCOSE, CAPILLARY: Glucose-Capillary: 139 mg/dL — ABNORMAL HIGH (ref 70–99)

## 2024-05-03 SURGERY — EGD (ESOPHAGOGASTRODUODENOSCOPY)
Anesthesia: General

## 2024-05-03 MED ORDER — PROPOFOL 10 MG/ML IV BOLUS
INTRAVENOUS | Status: DC | PRN
  Administered 2024-05-03 (×2): 100 mg via INTRAVENOUS
  Administered 2024-05-03 (×2): 50 mg via INTRAVENOUS

## 2024-05-03 MED ORDER — LACTATED RINGERS IV SOLN: INTRAVENOUS | Status: DC

## 2024-05-03 MED ORDER — LIDOCAINE HCL (CARDIAC) PF 100 MG/5ML IV SOSY
PREFILLED_SYRINGE | INTRAVENOUS | Status: DC | PRN
  Administered 2024-05-03: 100 mg via INTRAVENOUS

## 2024-05-03 NOTE — Anesthesia Postprocedure Evaluation (Signed)
 Anesthesia Post Note  Patient: Nataly Pacifico  Procedure(s) Performed: EGD (ESOPHAGOGASTRODUODENOSCOPY) DILATION, ESOPHAGUS  Patient location during evaluation: Phase II Anesthesia Type: General Level of consciousness: awake and alert Pain management: pain level controlled Vital Signs Assessment: post-procedure vital signs reviewed and stable Respiratory status: spontaneous breathing, nonlabored ventilation and respiratory function stable Cardiovascular status: stable Anesthetic complications: no   There were no known notable events for this encounter.   Last Vitals:  Vitals:   05/03/24 0642 05/03/24 0759  BP: 126/76 104/86  Pulse: (!) 52 63  Resp: 14 16  Temp: 36.6 C 36.5 C  SpO2: 97% 100%    Last Pain:  Vitals:   05/03/24 0759  TempSrc: Oral  PainSc: 0-No pain                 Huzaifa Viney L Sreekar Broyhill

## 2024-05-03 NOTE — Discharge Instructions (Addendum)
 EGD Discharge instructions Please read the instructions outlined below and refer to this sheet in the next few weeks. These discharge instructions provide you with general information on caring for yourself after you leave the hospital. Your doctor may also give you specific instructions. While your treatment has been planned according to the most current medical practices available, unavoidable complications occasionally occur. If you have any problems or questions after discharge, please call your doctor. ACTIVITY You may resume your regular activity but move at a slower pace for the next 24 hours.  Take frequent rest periods for the next 24 hours.  Walking will help expel (get rid of) the air and reduce the bloated feeling in your abdomen.  No driving for 24 hours (because of the anesthesia (medicine) used during the test).  You may shower.  Do not sign any important legal documents or operate any machinery for 24 hours (because of the anesthesia used during the test).  NUTRITION Drink plenty of fluids.  You may resume your normal diet.  Begin with a light meal and progress to your normal diet.  Avoid alcoholic beverages for 24 hours or as instructed by your caregiver.  MEDICATIONS You may resume your normal medications unless your caregiver tells you otherwise.  WHAT YOU CAN EXPECT TODAY You may experience abdominal discomfort such as a feeling of fullness or "gas" pains.  FOLLOW-UP Your doctor will discuss the results of your test with you.  SEEK IMMEDIATE MEDICAL ATTENTION IF ANY OF THE FOLLOWING OCCUR: Excessive nausea (feeling sick to your stomach) and/or vomiting.  Severe abdominal pain and distention (swelling).  Trouble swallowing.  Temperature over 101 F (37.8 C).  Rectal bleeding or vomiting of blood.   Your EGD revealed mild amount inflammation in your stomach.  I took biopsies of this to rule out infection with a bacteria called H. pylori.  I also took samples of your  esophagus.  Await pathology results, my office will contact you.  Small hiatal hernia again seen.  I did stretch your esophagus today.  Hopefully this helps with feeling of food getting stuck.  Small bowel appeared normal.  Continue on esomeprazole  twice daily.  Follow-up in GI office in 2 to 3 months.  I hope you have a great rest of your week!  Carlin POUR. Cindie, D.O. Gastroenterology and Hepatology [facility]

## 2024-05-03 NOTE — H&P (Signed)
 Primary Care Physician:  [facility] Primary Gastroenterologist:  Dr. Cindie  Pre-Procedure History & Physical: HPI:  Sarah Porter is a 63 y.o. female is here for an EGD with possible dilation due to history of dysphagia, GERD, Barrett's esophagus  Past Medical History:  Diagnosis Date   Anemia    Anxiety    Back pain    Chest pain    CHF (congestive heart failure) (HCC)    Constipation    Depression    Essential hypertension    Fibromyalgia    GERD (gastroesophageal reflux disease)    Hyperlipidemia    Joint pain    Osteoarthritis    Palpitations    Sleep apnea    SOB (shortness of breath)    Swelling of lower extremity    Type 2 diabetes mellitus (HCC)    Vertigo     Past Surgical History:  Procedure Laterality Date   BILATERAL KNEE REPAIRS     BIOPSY  07/05/2023   Procedure: BIOPSY;  Surgeon: Cindie Carlin POUR, DO;  [facility]: [facility];  Service: Endoscopy;;   CESAREAN SECTION     x 2   COLONOSCOPY  11/04/2011   Dr. Fairy Glee; diminutive colon polyp in the rectum, otherwise normal exam.  Pathology with tubular adenoma.  Recommended 5-year repeat.   COLONOSCOPY  07/12/2014   Dr. Fairy Glee; submucosal hemorrhages in proximal descending colon?  Prior ischemic or infectious colitis?,  Otherwise normal exam.  Recommended 5-year repeat.   COLONOSCOPY WITH PROPOFOL  N/A 07/22/2021   Procedure: COLONOSCOPY WITH PROPOFOL ;  Surgeon: Cindie Carlin POUR, DO;  Location: [facility];  Service: Endoscopy;  Laterality: N/A;  8:45am   ESOPHAGOGASTRODUODENOSCOPY  07/21/2004   Dr. Fairy Glee; grade 2 erosive esophagitis at EG junction, cervical inlet patch, otherwise normal exam.   ESOPHAGOGASTRODUODENOSCOPY (EGD) WITH PROPOFOL  N/A 07/05/2023   Procedure: ESOPHAGOGASTRODUODENOSCOPY (EGD) WITH PROPOFOL ;  Surgeon: Cindie Carlin POUR, DO;  Location: [facility];  Service: Endoscopy;  Laterality: N/A;  11:15 am, asa 3    Prior to Admission medications    Medication Sig Start Date End Date Taking? Authorizing Provider  aspirin EC 81 MG tablet Take 81 mg by mouth daily. Swallow whole.   Yes [provider]  atorvastatin (LIPITOR) 80 MG tablet Take 80 mg by mouth in the morning.   Yes [provider]  bisacodyl (DULCOLAX) 5 MG EC tablet Take 5 mg by mouth daily as needed for moderate constipation.   Yes [provider]  busPIRone (BUSPAR) 10 MG tablet Take 10 mg by mouth 3 (three) times daily. 08/15/23  Yes [provider]  cholecalciferol (VITAMIN D3) 25 MCG (1000 UNIT) tablet Take 1,000 Units by mouth daily.   Yes [provider]  DULoxetine (CYMBALTA) 60 MG capsule Take 60 mg by mouth daily. 10/30/22  Yes [provider]  ELDERBERRY PO Take 1 tablet by mouth daily.   Yes [provider]  esomeprazole  (NEXIUM ) 40 MG capsule Take 1 capsule (40 mg total) by mouth 2 (two) times daily before a meal. 01/03/24  Yes Rudy, Kristen S, PA-C  ezetimibe  (ZETIA ) 10 MG tablet Take 1 tablet (10 mg total) by mouth daily. 09/28/23 05/03/24 Yes Miriam Norris, NP  famotidine (PEPCID) 20 MG tablet Take 20 mg by mouth daily.   Yes [provider]  furosemide  (LASIX ) 40 MG tablet TAKE 1 TABLET BY MOUTH 2 TIMES DAILY. (MAY DECREASE TO DAILY WHEN NOT NEEDED TWICE A DAY) 03/31/24  Yes  Debera Jayson MATSU, MD  LINZESS 145 MCG CAPS capsule Take 145 mcg by mouth every morning. 12/29/23  Yes [provider]  Magnesium  250 MG TABS Take 250 mg by mouth daily.   Yes [provider]  Multiple Vitamin (MULTIVITAMIN WITH MINERALS) TABS tablet Take 1 tablet by mouth in the morning.   Yes [provider]  ondansetron  (ZOFRAN ) 4 MG tablet Take 1 tablet (4 mg total) by mouth every 8 (eight) hours as needed for nausea or vomiting. 10/27/23  Yes Rudy Josette RAMAN, PA-C  Oxycodone  HCl 10 MG TABS Take 1 tablet by mouth 2 (two) times daily.   Yes [provider]  potassium chloride  SA  (KLOR-CON  M) 20 MEQ tablet Take 1 tablet by mouth twice daily 02/01/24  Yes Debera Jayson MATSU, MD  pregabalin (LYRICA) 50 MG capsule Take 100 mg by mouth 2 (two) times daily. 01/14/24  Yes [provider]  traZODone (DESYREL) 50 MG tablet Take 75-100 mg by mouth at bedtime as needed for sleep.   Yes [provider]  UBRELVY 100 MG TABS Take 100 mg by mouth daily as needed (migraine). 12/08/22  Yes [provider]  vitamin C (ASCORBIC ACID) 500 MG tablet Take 500 mg by mouth in the morning.   Yes [provider]  Continuous Glucose Sensor (DEXCOM G7 SENSOR) MISC  09/22/23   [provider]  HYDROmorphone  (DILAUDID ) 2 MG tablet Take 2 mg by mouth every 6 (six) hours as needed. 03/27/24   [provider]    Allergies as of 04/07/2024   (No Known Allergies)    Family History  Problem Relation Age of Onset   AAA (abdominal aortic aneurysm) Mother    Sudden death Mother    Alcoholism Mother    Dementia Father    Asthma Sister    Kidney cancer Sister        had kidney removed   Diabetes Sister    Lymphoma Nephew    Colon cancer Neg Hx     Social History   Socioeconomic History   Marital status: Significant Other    Spouse name: Not on file   Number of children: Not on file   Years of education: Not on file   Highest education level: Not on file  Occupational History   Not on file  Tobacco Use   Smoking status: Former    Current packs/day: 0.00    Types: Cigarettes    Quit date: 09/01/1999    Years since quitting: 24.6    Passive exposure: Never   Smokeless tobacco: Never   Tobacco comments:    smoked for approx 20 years   Vaping Use   Vaping status: Never Used  Substance and Sexual Activity   Alcohol use: Not Currently   Drug use: Yes    Frequency: 2.0 times per week    Types: Marijuana    Comment: occasionally-cbd   Sexual activity: Yes    Birth control/protection: Post-menopausal  Other Topics Concern   Not on file   Social History Narrative   Are you right handed or left handed? Right   Are you currently employed ? Retired   What is your current occupation?   Do you live at home alone? NO    Who lives with you?    What type of home do you live in: 1 story or 2 story? 1       Social Drivers of Corporate investment banker Strain: Low Risk  (  12/16/2022)   Overall Financial Resource Strain (CARDIA)    Difficulty of Paying Living Expenses: Not very hard  Food Insecurity: No Food Insecurity (12/16/2022)   Hunger Vital Sign    Worried About Running Out of Food in the Last Year: Never true    Ran Out of Food in the Last Year: Never true  Transportation Needs: No Transportation Needs (12/16/2022)   PRAPARE - Administrator, Civil Service (Medical): No    Lack of Transportation (Non-Medical): No  Physical Activity: Sufficiently Active (12/16/2022)   Exercise Vital Sign    Days of Exercise per Week: 3 days    Minutes of Exercise per Session: 60 min  Stress: Stress Concern Present (12/16/2022)   [employer] of Occupational Health - Occupational Stress Questionnaire    Feeling of Stress : Very much  Social Connections: Socially Integrated (12/16/2022)   Social Connection and Isolation Panel    Frequency of Communication with Friends and Family: More than three times a week    Frequency of Social Gatherings with Friends and Family: Three times a week    Attends Religious Services: More than 4 times per year    Active Member of Clubs or Organizations: Yes    Attends Banker Meetings: More than 4 times per year    Marital Status: Living with partner  Intimate Partner Violence: Not At Risk (12/16/2022)   Humiliation, Afraid, Rape, and Kick questionnaire    Fear of Current or Ex-Partner: No    Emotionally Abused: No    Physically Abused: No    Sexually Abused: No    Review of Systems: General: Negative for fever, chills, fatigue, weakness. Eyes: Negative for vision changes.   ENT: Negative for hoarseness, difficulty swallowing , nasal congestion. CV: Negative for chest pain, angina, palpitations, dyspnea on exertion, peripheral edema.  Respiratory: Negative for dyspnea at rest, dyspnea on exertion, cough, sputum, wheezing.  GI: See history of present illness. GU:  Negative for dysuria, hematuria, urinary incontinence, urinary frequency, nocturnal urination.  MS: Negative for joint pain, low back pain.  Derm: Negative for rash or itching.  Neuro: Negative for weakness, abnormal sensation, seizure, frequent headaches, memory loss, confusion.  Psych: Negative for anxiety, depression Endo: Negative for unusual weight change.  Heme: Negative for bruising or bleeding. Allergy: Negative for rash or hives.  Physical Exam: Vital signs in last 24 hours: Temp:  [97.8 F (36.6 C)] 97.8 F (36.6 C) (09/03 0642) Pulse Rate:  [52] 52 (09/03 0642) Resp:  [14] 14 (09/03 0642) BP: (126)/(76) 126/76 (09/03 0642) SpO2:  [97 %] 97 % (09/03 0642) Weight:  [893 kg] 106 kg (09/03 9357)   General:   Alert,  Well-developed, well-nourished, pleasant and cooperative in NAD Head:  Normocephalic and atraumatic. Eyes:  Sclera clear, no icterus.   Conjunctiva pink. Ears:  Normal auditory acuity. Nose:  No deformity, discharge,  or lesions. Msk:  Symmetrical without gross deformities. Normal posture. Extremities:  Without clubbing or edema. Neurologic:  Alert and  oriented x4;  grossly normal neurologically. Skin:  Intact without significant lesions or rashes. Psych:  Alert and cooperative. Normal mood and affect.   Impression/Plan: Nakshatra Klose is here  for an EGD with possible dilation due to history of dysphagia, GERD, Barrett's esophagus  Risks, benefits, limitations, imponderables and alternatives regarding procedure have been reviewed with the patient. Questions have been answered. All parties agreeable.

## 2024-05-03 NOTE — Op Note (Signed)
 [facility] Patient Name: Sarah Porter Procedure Date: 05/03/2024 6:58 AM MRN: [id number] Date of Birth: Sep 19, 1960 Attending MD: Carlin POUR. Cindie , [place], [id number] CSN: [id number] Age: 63 Admit Type: Outpatient Procedure:                Upper GI endoscopy Indications:              Epigastric abdominal pain, Dysphagia, Heartburn Providers:                Carlin POUR. Cindie, DO, Devere Lodge, Jon Loge Referring MD:              Medicines:                See the Anesthesia note for documentation of the                            administered medications Complications:            No immediate complications. Estimated Blood Loss:     Estimated blood loss was minimal. Procedure:                Pre-Anesthesia Assessment:                           - The anesthesia plan was to use monitored                            anesthesia care (MAC).                           After obtaining informed consent, the endoscope was                            passed under direct vision. Throughout the                            procedure, the patient's blood pressure, pulse, and                            oxygen saturations were monitored continuously. The                            [id number] ([id number])Leezm was introduced through the                            mouth, and advanced to the second part of duodenum.                            The upper GI endoscopy was accomplished without                            difficulty. The patient tolerated the procedure                            well. Scope In: 7:46:47 AM Scope Out: 7:55:32 AM Total Procedure Duration: 0 hours 8 minutes 45 seconds  Findings:      A small hiatal hernia was present.  Biopsies were taken with a cold forceps in the middle third of the       esophagus for histology.      No endoscopic abnormality was evident in the esophagus to explain the       patient's complaint of dysphagia. Preparations were made for empiric        dilation. A TTS dilator was passed through the scope. Dilation with an       18-19-20 mm balloon dilator was performed to 20 mm. Dilation was       performed with a mild resistance at 20 mm. Estimated blood loss was none.      Patchy mild inflammation characterized by erosions and erythema was       found in the entire examined stomach. Biopsies were taken with a cold       forceps for Helicobacter pylori testing.      The duodenal bulb, first portion of the duodenum and second portion of       the duodenum were normal. Impression:               - Small hiatal hernia.                           - Gastritis. Biopsied.                           - Normal duodenal bulb, first portion of the                            duodenum and second portion of the duodenum.                           - Biopsies were taken with a cold forceps for                            histology in the middle third of the esophagus. Moderate Sedation:      Per Anesthesia Care Recommendation:           - Patient has a contact number available for                            emergencies. The signs and symptoms of potential                            delayed complications were discussed with the                            patient. Return to normal activities tomorrow.                            Written discharge instructions were provided to the                            patient.                           - Resume previous diet.                           -  Continue present medications.                           - Await pathology results.                           - Repeat upper endoscopy PRN for retreatment.                           - Use a proton pump inhibitor PO BID.                           - Return to GI clinic in 3 months. Procedure Code(s):        --- Professional ---                           [id number], Esophagogastroduodenoscopy, flexible,                            transoral; with biopsy, single or  multiple Diagnosis Code(s):        --- Professional ---                           K44.9, Diaphragmatic hernia without obstruction or                            gangrene                           K29.70, Gastritis, unspecified, without bleeding                           R10.13, Epigastric pain                           R13.10, Dysphagia, unspecified                           R12, Heartburn CPT copyright 2022 American Medical Association. All rights reserved. The codes documented in this report are preliminary and upon coder review may  be revised to meet current compliance requirements. Carlin POUR. Cindie, DO Carlin POUR. Cindie, DO 05/03/2024 7:58:31 AM This report has been signed electronically. Number of Addenda: 0

## 2024-05-03 NOTE — Transfer of Care (Signed)
 Immediate Anesthesia Transfer of Care Note  Patient: Sarah Porter  Procedure(s) Performed: EGD (ESOPHAGOGASTRODUODENOSCOPY) DILATION, ESOPHAGUS  Patient Location: Short Stay  Anesthesia Type:General  Level of Consciousness: awake, alert , and oriented  Airway & Oxygen Therapy: Patient Spontanous Breathing and Patient connected to nasal cannula oxygen  Post-op Assessment: Report given to RN and Post -op Vital signs reviewed and stable  Post vital signs: Reviewed and stable  Last Vitals:  Vitals Value Taken Time  BP    Temp    Pulse    Resp    SpO2      Last Pain:  Vitals:   05/03/24 0742  TempSrc:   PainSc: 8       Patients Stated Pain Goal: 8 (05/03/24 9357)  Complications: No notable events documented.

## 2024-05-04 LAB — SURGICAL PATHOLOGY

## 2024-05-08 ENCOUNTER — Encounter (HOSPITAL_COMMUNITY): Payer: Self-pay | Admitting: Internal Medicine

## 2024-05-09 DIAGNOSIS — F332 Major depressive disorder, recurrent severe without psychotic features: Secondary | ICD-10-CM | POA: Diagnosis not present

## 2024-05-11 ENCOUNTER — Ambulatory Visit: Payer: Self-pay | Admitting: Internal Medicine

## 2024-05-12 ENCOUNTER — Ambulatory Visit (HOSPITAL_COMMUNITY)
Admission: RE | Admit: 2024-05-12 | Discharge: 2024-05-12 | Disposition: A | Discharge status: Home / Self Care | Source: Ambulatory Visit | Attending: Obstetrics & Gynecology | Admitting: Obstetrics & Gynecology

## 2024-05-12 DIAGNOSIS — F419 Anxiety disorder, unspecified: Secondary | ICD-10-CM | POA: Diagnosis not present

## 2024-05-12 DIAGNOSIS — Z1231 Encounter for screening mammogram for malignant neoplasm of breast: Secondary | ICD-10-CM | POA: Insufficient documentation

## 2024-05-12 DIAGNOSIS — F329 Major depressive disorder, single episode, unspecified: Secondary | ICD-10-CM | POA: Diagnosis not present

## 2024-05-15 NOTE — Patient Instructions (Signed)

## 2024-05-16 ENCOUNTER — Other Ambulatory Visit (HOSPITAL_COMMUNITY): Payer: Self-pay

## 2024-05-16 ENCOUNTER — Ambulatory Visit (INDEPENDENT_AMBULATORY_CARE_PROVIDER_SITE_OTHER): Admitting: Nurse Practitioner

## 2024-05-16 ENCOUNTER — Telehealth: Payer: Self-pay

## 2024-05-16 ENCOUNTER — Encounter: Payer: Self-pay | Admitting: Nurse Practitioner

## 2024-05-16 ENCOUNTER — Ambulatory Visit: Payer: Self-pay | Admitting: Obstetrics & Gynecology

## 2024-05-16 ENCOUNTER — Telehealth: Payer: Self-pay | Admitting: *Deleted

## 2024-05-16 VITALS — BP 138/76 | HR 73 | Ht 61.0 in | Wt 241.6 lb

## 2024-05-16 DIAGNOSIS — E119 Type 2 diabetes mellitus without complications: Secondary | ICD-10-CM | POA: Diagnosis not present

## 2024-05-16 DIAGNOSIS — I10 Essential (primary) hypertension: Secondary | ICD-10-CM

## 2024-05-16 DIAGNOSIS — Z7985 Long-term (current) use of injectable non-insulin antidiabetic drugs: Secondary | ICD-10-CM

## 2024-05-16 DIAGNOSIS — E559 Vitamin D deficiency, unspecified: Secondary | ICD-10-CM

## 2024-05-16 DIAGNOSIS — E782 Mixed hyperlipidemia: Secondary | ICD-10-CM | POA: Diagnosis not present

## 2024-05-16 LAB — POCT GLYCOSYLATED HEMOGLOBIN (HGB A1C): Hemoglobin A1C: 8 % — AB (ref 4.0–5.6)

## 2024-05-16 MED ORDER — DEXCOM G7 SENSOR MISC: 1.0000 | 3 refills | Status: DC

## 2024-05-16 MED ORDER — TRULICITY 0.75 MG/0.5ML ~~LOC~~ SOAJ: 0.7500 mg | SUBCUTANEOUS | 1 refills | Status: DC

## 2024-05-16 MED ORDER — TRULICITY 1.5 MG/0.5ML ~~LOC~~ SOAJ: 1.5000 mg | SUBCUTANEOUS | 1 refills | Status: DC

## 2024-05-16 NOTE — Progress Notes (Signed)
 Endocrinology Consult Note       05/16/2024, 10:25 AM   Subjective:    Patient ID: Sarah Porter, female    DOB: 20-Jun-1961.  Sarah Porter is being seen in consultation for management of currently uncontrolled symptomatic diabetes requested by  Ziomek, Paul, MD.   Past Medical History:  Diagnosis Date   Anemia    Anxiety    Back pain    Chest pain    CHF (congestive heart failure) (HCC)    Constipation    Depression    Essential hypertension    Fibromyalgia    GERD (gastroesophageal reflux disease)    Hyperlipidemia    Joint pain    Osteoarthritis    Palpitations    Sleep apnea    SOB (shortness of breath)    Swelling of lower extremity    Type 2 diabetes mellitus (HCC)    Vertigo     Past Surgical History:  Procedure Laterality Date   BILATERAL KNEE REPAIRS     BIOPSY  07/05/2023   Procedure: BIOPSY;  Surgeon: Cindie Carlin POUR, DO;  [facility]: [facility];  Service: Endoscopy;;   CESAREAN SECTION     x 2   COLONOSCOPY  11/04/2011   Dr. Fairy Glee; diminutive colon polyp in the rectum, otherwise normal exam.  Pathology with tubular adenoma.  Recommended 5-year repeat.   COLONOSCOPY  07/12/2014   Dr. Fairy Glee; submucosal hemorrhages in proximal descending colon?  Prior ischemic or infectious colitis?,  Otherwise normal exam.  Recommended 5-year repeat.   COLONOSCOPY WITH PROPOFOL  N/A 07/22/2021   Procedure: COLONOSCOPY WITH PROPOFOL ;  Surgeon: Cindie Carlin POUR, DO;  [facility]: [facility];  Service: Endoscopy;  Laterality: N/A;  8:45am   ESOPHAGEAL DILATION N/A 05/03/2024   Procedure: DILATION, ESOPHAGUS;  Surgeon: Cindie Carlin POUR, DO;  [facility]: [facility];  Service: Endoscopy;  Laterality: N/A;   ESOPHAGOGASTRODUODENOSCOPY  07/21/2004   Dr. Fairy Glee; grade 2 erosive esophagitis at EG junction, cervical inlet patch, otherwise normal exam.   ESOPHAGOGASTRODUODENOSCOPY N/A 05/03/2024    Procedure: EGD (ESOPHAGOGASTRODUODENOSCOPY);  Surgeon: Cindie Carlin POUR, DO;  [facility];  Service: Endoscopy;  Laterality: N/A;  730AM, ASA 3   ESOPHAGOGASTRODUODENOSCOPY (EGD) WITH PROPOFOL  N/A 07/05/2023   Procedure: ESOPHAGOGASTRODUODENOSCOPY (EGD) WITH PROPOFOL ;  Surgeon: Cindie Carlin POUR, DO;  [facility]: [facility];  Service: Endoscopy;  Laterality: N/A;  11:15 am, asa 3    Social History   Socioeconomic History   Marital status: Significant Other    Spouse name: Not on file   Number of children: Not on file   Years of education: Not on file   Highest education level: Not on file  Occupational History   Not on file  Tobacco Use   Smoking status: Former    Current packs/day: 0.00    Types: Cigarettes    Quit date: 09/01/1999    Years since quitting: 24.7    Passive exposure: Never   Smokeless tobacco: Never   Tobacco comments:    smoked for approx 20 years   Vaping Use   Vaping status: Never Used  Substance and Sexual Activity   Alcohol use: Not Currently  Drug use: Yes    Frequency: 2.0 times per week    Types: Marijuana    Comment: occasionally-cbd   Sexual activity: Yes    Birth control/protection: Post-menopausal  Other Topics Concern   Not on file  Social History Narrative   Are you right handed or left handed? Right   Are you currently employed ? Retired   What is your current occupation?   Do you live at home alone? NO    Who lives with you?    What type of home do you live in: 1 story or 2 story? 1       Social Drivers of Corporate investment banker Strain: Low Risk  (12/16/2022)   Overall Financial Resource Strain (CARDIA)    Difficulty of Paying Living Expenses: Not very hard  Food Insecurity: No Food Insecurity (12/16/2022)   Hunger Vital Sign    Worried About Running Out of Food in the Last Year: Never true    Ran Out of Food in the Last Year: Never true  Transportation Needs: No Transportation Needs (12/16/2022)   PRAPARE -  Administrator, Civil Service (Medical): No    Lack of Transportation (Non-Medical): No  Physical Activity: Sufficiently Active (12/16/2022)   Exercise Vital Sign    Days of Exercise per Week: 3 days    Minutes of Exercise per Session: 60 min  Stress: Stress Concern Present (12/16/2022)   [employer] of Occupational Health - Occupational Stress Questionnaire    Feeling of Stress : Very much  Social Connections: Socially Integrated (12/16/2022)   Social Connection and Isolation Panel    Frequency of Communication with Friends and Family: More than three times a week    Frequency of Social Gatherings with Friends and Family: Three times a week    Attends Religious Services: More than 4 times per year    Active Member of Clubs or Organizations: Yes    Attends Engineer, structural: More than 4 times per year    Marital Status: Living with partner    Family History  Problem Relation Age of Onset   AAA (abdominal aortic aneurysm) Mother    Sudden death Mother    Alcoholism Mother    Dementia Father    Asthma Sister    Kidney cancer Sister        had kidney removed   Diabetes Sister    Lymphoma Nephew    Colon cancer Neg Hx     Outpatient Encounter Medications as of 05/16/2024  Medication Sig   aspirin EC 81 MG tablet Take 81 mg by mouth daily. Swallow whole.   atorvastatin (LIPITOR) 80 MG tablet Take 80 mg by mouth in the morning.   bisacodyl (DULCOLAX) 5 MG EC tablet Take 5 mg by mouth daily as needed for moderate constipation.   busPIRone (BUSPAR) 10 MG tablet Take 10 mg by mouth 3 (three) times daily.   cholecalciferol (VITAMIN D3) 25 MCG (1000 UNIT) tablet Take 1,000 Units by mouth daily.   Continuous Glucose Sensor (DEXCOM G7 SENSOR) MISC Inject 1 Application into the skin as directed. Change sensor every 10 days as directed.   Dulaglutide  (TRULICITY ) 0.75 MG/0.5ML SOAJ Inject 0.75 mg into the skin once a week.   [START ON 06/13/2024] Dulaglutide   (TRULICITY ) 1.5 MG/0.5ML SOAJ Inject 1.5 mg into the skin once a week.   DULoxetine (CYMBALTA) 60 MG capsule Take 60 mg by mouth daily.   ELDERBERRY PO Take 1 tablet by  mouth daily.   esomeprazole  (NEXIUM ) 40 MG capsule Take 1 capsule (40 mg total) by mouth 2 (two) times daily before a meal.   ezetimibe  (ZETIA ) 10 MG tablet Take 1 tablet (10 mg total) by mouth daily.   famotidine (PEPCID) 20 MG tablet Take 20 mg by mouth daily.   furosemide  (LASIX ) 40 MG tablet TAKE 1 TABLET BY MOUTH 2 TIMES DAILY. (MAY DECREASE TO DAILY WHEN NOT NEEDED TWICE A DAY)   LINZESS 145 MCG CAPS capsule Take 145 mcg by mouth every morning.   Magnesium  250 MG TABS Take 250 mg by mouth daily.   Multiple Vitamin (MULTIVITAMIN WITH MINERALS) TABS tablet Take 1 tablet by mouth in the morning.   ondansetron  (ZOFRAN ) 4 MG tablet Take 1 tablet (4 mg total) by mouth every 8 (eight) hours as needed for nausea or vomiting.   Oxycodone  HCl 10 MG TABS Take 1 tablet by mouth 2 (two) times daily.   potassium chloride  SA (KLOR-CON  M) 20 MEQ tablet Take 1 tablet by mouth twice daily   pregabalin (LYRICA) 50 MG capsule Take 100 mg by mouth 2 (two) times daily.   traZODone (DESYREL) 50 MG tablet Take 75-100 mg by mouth at bedtime as needed for sleep.   UBRELVY 100 MG TABS Take 100 mg by mouth daily as needed (migraine).   vitamin C (ASCORBIC ACID) 500 MG tablet Take 500 mg by mouth in the morning.   Continuous Glucose Sensor (DEXCOM G7 SENSOR) MISC  (Patient not taking: Reported on 05/16/2024)   HYDROmorphone  (DILAUDID ) 2 MG tablet Take 2 mg by mouth every 6 (six) hours as needed. (Patient not taking: Reported on 05/16/2024)   No facility-administered encounter medications on file as of 05/16/2024.    ALLERGIES: No Known Allergies  VACCINATION STATUS:  There is no immunization history on file for this patient.  Diabetes She presents for her initial diabetic visit. She has type 2 diabetes mellitus. Onset time: diagnosed at approx  age of 37. Her disease course has been stable. Hypoglycemia symptoms include hunger, nervousness/anxiousness and sweats. Associated symptoms include fatigue. There are no hypoglycemic complications. Diabetic complications include heart disease. Risk factors for coronary artery disease include diabetes mellitus, dyslipidemia, family history and hypertension. When asked about current treatments, none were reported. Her weight is increasing steadily. She is following a generally unhealthy diet. When asked about meal planning, she reported none. She has not had a previous visit with a dietitian. She participates in exercise intermittently. (She presents today for her consultation with no meter or logs to review.  She does not monitor glucose routinely as she is not on any medications for her diabetes.  Her POCT A1c today is 8%, increasing from last A1c of 7.4%.  She drinks mostly water  and coffee- with stevia.  She skips breakfast, eats lunch and supper and sometime late night snacks.  She does not engage in routine exercise due to chronic pain issues.  She is due for eye exam, has recently seen podiatry.) An ACE inhibitor/angiotensin II receptor blocker is not being taken. She sees a podiatrist.Eye exam is current.     Review of systems  Constitutional: +increasing body weight, current Body mass index is 45.65 kg/m., no fatigue, no subjective hyperthermia, no subjective hypothermia Eyes: no blurry vision, no xerophthalmia ENT: no sore throat, no nodules palpated in throat, no dysphagia/odynophagia, no hoarseness Cardiovascular: no chest pain, no shortness of breath, no palpitations, no leg swelling Respiratory: no cough, no shortness of breath Gastrointestinal: no nausea/vomiting/diarrhea, +  reflux, + constipation- controlled on Linzess Musculoskeletal: no muscle/joint aches Skin: no rashes, no hyperemia Neurological: no tremors, no numbness, no tingling, no dizziness Psychiatric: no depression, no  anxiety, + increased stress -helps take care of her father in law  Objective:     BP 138/76 (BP Location: Left Arm, Patient Position: Sitting, Cuff Size: Large)   Pulse 73   Ht 5' 1 (1.549 m)   Wt 241 lb 9.6 oz (109.6 kg)   BMI 45.65 kg/m   Wt Readings from Last 3 Encounters:  05/16/24 241 lb 9.6 oz (109.6 kg)  05/03/24 233 lb 11 oz (106 kg)  04/28/24 233 lb 11 oz (106 kg)     BP Readings from Last 3 Encounters:  05/16/24 138/76  05/03/24 104/86  04/28/24 139/63     Physical Exam- Limited  Constitutional:  Body mass index is 45.65 kg/m. , not in acute distress, normal state of mind Eyes:  EOMI, no exophthalmos Neck: Supple Cardiovascular: RRR, no murmurs, rubs, or gallops, no edema Respiratory: Adequate breathing efforts, no crackles, rales, rhonchi, or wheezing Musculoskeletal: no gross deformities, strength intact in all four extremities, no gross restriction of joint movements Skin:  no rashes, no hyperemia Neurological: no tremor with outstretched hands   Diabetic Foot Exam - Simple   No data filed      CMP ( most recent) CMP     Component Value Date/Time   NA 137 04/28/2024 0931   NA 140 11/01/2023 1021   K 4.6 04/28/2024 0931   CL 103 04/28/2024 0931   CO2 21 (L) 04/28/2024 0931   GLUCOSE 121 (H) 04/28/2024 0931   BUN 27 (H) 04/28/2024 0931   BUN 16 11/01/2023 1021   CREATININE 0.77 04/28/2024 0931   CALCIUM 8.9 04/28/2024 0931   CALCIUM 9.3 12/24/2023 0000   PROT 7.3 11/01/2023 1023   ALBUMIN 4.6 11/01/2023 1023   AST 30 11/01/2023 1023   ALT 35 (H) 11/01/2023 1023   ALKPHOS 127 (H) 11/01/2023 1023   BILITOT 0.8 11/01/2023 1023   EGFR 99.0 12/24/2023 0000   EGFR 69 11/01/2023 1021   GFRNONAA >60 04/28/2024 0931     Diabetic Labs (most recent): Lab Results  Component Value Date   HGBA1C 8.0 (A) 05/16/2024   HGBA1C 6.0 (H) 02/17/2023     Lipid Panel ( most recent) Lipid Panel     Component Value Date/Time   CHOL 213 (H) 02/17/2023  0918   TRIG 104 02/17/2023 0918   HDL 83 02/17/2023 0918   LDLCALC 112 (H) 02/17/2023 0918   LABVLDL 18 02/17/2023 0918      Lab Results  Component Value Date   TSH 1.14 12/24/2023   TSH 2.180 10/12/2023   TSH 1.030 02/17/2023   FREET4 6.0 12/24/2023   FREET4 1.11 02/17/2023           Assessment & Plan:   1) Type 2 diabetes mellitus without complication, without long-term current use of insulin  (HCC) (Primary)  She presents today for her consultation with no meter or logs to review.  She does not monitor glucose routinely as she is not on any medications for her diabetes.  Her POCT A1c today is 8%, increasing from last A1c of 7.4%.  She drinks mostly water  and coffee- with stevia.  She skips breakfast, eats lunch and supper and sometime late night snacks.  She does not engage in routine exercise due to chronic pain issues.  She is due for eye exam, has recently seen  podiatry.  - Sarah Porter has currently uncontrolled symptomatic type 2 DM since 63 years of age, with most recent A1c of 8 %.   -Recent labs reviewed.  - I had a long discussion with her about the progressive nature of diabetes and the pathology behind its complications. -her diabetes is complicated by CHF and she remains at a high risk for more acute and chronic complications which include CAD, CVA, CKD, retinopathy, and neuropathy. These are all discussed in detail with her.  The following Lifestyle Medicine recommendations according to American College of Lifestyle Medicine [facility]) were discussed and offered to patient and she agrees to start the journey:  A. Whole Foods, Plant-based plate comprising of fruits and vegetables, plant-based proteins, whole-grain carbohydrates was discussed in detail with the patient.   A list for source of those nutrients were also provided to the patient.  Patient will use only water  or unsweetened tea for hydration. B.  The need to stay away from risky substances including alcohol,  smoking; obtaining 7 to 9 hours of restorative sleep, at least 150 minutes of moderate intensity exercise weekly, the importance of healthy social connections,  and stress reduction techniques were discussed. C.  A full color page of  Calorie density of various food groups per pound showing examples of each food groups was provided to the patient.  - I have counseled her on diet and weight management by adopting a carbohydrate restricted/protein rich diet. Patient is encouraged to switch to unprocessed or minimally processed complex starch and increased protein intake (animal or plant source), fruits, and vegetables. -  she is advised to stick to a routine mealtimes to eat 3 meals a day and avoid unnecessary snacks (to snack only to correct hypoglycemia).   - she acknowledges that there is a room for improvement in her food and drink choices. - Suggestion is made for her to avoid simple carbohydrates from her diet including Cakes, Sweet Desserts, Ice Cream, Soda (diet and regular), Sweet Tea, Candies, Chips, Cookies, Store Bought Juices, Alcohol in Excess of 1-2 drinks a day, Artificial Sweeteners, Coffee Creamer, and Sugar-free Products. This will help patient to have more stable blood glucose profile and potentially avoid unintended weight gain.  - I have approached her with the following individualized plan to manage her diabetes and patient agrees:   -I did restart her on Trulicity  0.75 mg SQ weekly x 1 month with instructions to increase to the 1.5 mg SQ weekly thereafter if she tolerates it.  She is aware that GLP1 medications may exacerbate her existing GI issues and is advised to stop it and call if it does.  -She did try Metformin  in the past but stopped due to GI concerns.  -she is encouraged to start/continue monitoring glucose 1 times daily, before breakfast, and to call the clinic if she has readings less than 70 or above 300 for 3 tests in a row.  I did send in [facility] G7 for her, she  tried it in the past and liked it.  - Adjustment parameters are given to her for hypo and hyperglycemia in writing.  - Specific targets for  A1c; LDL, HDL, and Triglycerides were discussed with the patient.  2) Blood Pressure /Hypertension:  her blood pressure is controlled to target.   she is advised to continue her current medications as prescribed by PCP.  3) Lipids/Hyperlipidemia:    Review of her recent lipid panel from 12/28/23 showed uncontrolled LDL at 106 .  she is advised  to continue Zetia  10 mg daily at bedtime and Atorvastatin 80 mg po daily.  Side effects and precautions discussed with her.  4)  Weight/Diet:  her Body mass index is 45.65 kg/m.  -  clearly complicating her diabetes care.   she is a candidate for weight loss. I discussed with her the fact that loss of 5 - 10% of her  current body weight will have the most impact on her diabetes management.  Exercise, and detailed carbohydrates information provided  -  detailed on discharge instructions.  5) Chronic Care/Health Maintenance: -she is not on ACEI/ARB and is on Statin medications and is encouraged to initiate and continue to follow up with Ophthalmology, Dentist, Podiatrist at least yearly or according to recommendations, and advised to stay away from smoking. I have recommended yearly flu vaccine and pneumonia vaccine at least every 5 years; moderate intensity exercise for up to 150 minutes weekly; and sleep for at least 7 hours a day.  - she is advised to maintain close follow up with Ziomek, Paul, MD for primary care needs, as well as her other providers for optimal and coordinated care.   - Time spent in this patient care: 60 min, which was spent in counseling her about her diabetes and the rest reviewing her blood glucose logs, discussing her hypoglycemia and hyperglycemia episodes, reviewing her current and previous labs/studies (including abstraction from other facilities) and medications doses and developing a long  term treatment plan based on the latest standards of care/guidelines; and documenting her care.    Please refer to Patient Instructions for Blood Glucose Monitoring and Insulin /Medications Dosing Guide in media tab for additional information. Please also refer to Patient Self Inventory in the Media tab for reviewed elements of pertinent patient history.  Sarah Porter participated in the discussions, expressed understanding, and voiced agreement with the above plans.  All questions were answered to her satisfaction. she is encouraged to contact clinic should she have any questions or concerns prior to her return visit.     Follow up plan: - Return in about 3 months (around 08/15/2024) for Diabetes F/U with A1c in office, No previsit labs.    Sarah Porter, [facility] [address] [place], [place] [address] Phone: [phone] Fax: [phone]  05/16/2024, 10:25 AM

## 2024-05-16 NOTE — Telephone Encounter (Signed)
 Pharmacy Patient Advocate Encounter   Received notification from Pt Calls Messages that prior authorization for Trulicity  0.75MG /0.5ML auto-injectors is required/requested.   Insurance verification completed.   The patient is insured through [facility] .   Per test claim: PA required; PA submitted to above mentioned insurance via Latent Key/confirmation #/EOC [id number] Status is pending

## 2024-05-16 NOTE — Telephone Encounter (Signed)
 Patient called and left a message stating that after her visit today, she went by the pharmacy to get the Turlicity. She was told that this medication  would need a PA  . Patient advised that  we would look and see if the PA was needed.

## 2024-05-17 ENCOUNTER — Encounter: Payer: Self-pay | Admitting: Cardiology

## 2024-05-17 ENCOUNTER — Ambulatory Visit: Attending: Cardiology | Admitting: Cardiology

## 2024-05-17 VITALS — BP 128/76 | HR 72 | Ht 61.0 in | Wt 241.8 lb

## 2024-05-17 DIAGNOSIS — E782 Mixed hyperlipidemia: Secondary | ICD-10-CM | POA: Diagnosis not present

## 2024-05-17 DIAGNOSIS — R0789 Other chest pain: Secondary | ICD-10-CM

## 2024-05-17 DIAGNOSIS — R002 Palpitations: Secondary | ICD-10-CM

## 2024-05-17 NOTE — Patient Instructions (Addendum)

## 2024-05-17 NOTE — Progress Notes (Signed)
 Cardiology Office Note  Date: 05/17/2024   ID: Sarah Porter, DOB 1961-07-29, MRN [id number]  History of Present Illness: Sarah Porter is a 63 y.o. female last seen in March by Ms. Strader PA-C, I reviewed her note.  Our last visit was in 2023.  She is here for a follow-up visit.  I reviewed interval testing.  She does not report any anginal chest discomfort.  Does describe midsternal and neck discomfort that sounds potentially related to reflux and/or esophageal spasm.  The symptoms improve with antireflux medications.  She is seeing an endocrinologist, concerned about difficulty losing weight.  Medications reviewed.  She remains on lipid-lowering therapy and also aspirin.  She uses Lasix  40 mg tablets as needed, perhaps once every few weeks.  Physical Exam: VS:  BP 128/76 (BP Location: Left Arm)   Pulse 72   Ht 5' 1 (1.549 m)   Wt 241 lb 12.8 oz (109.7 kg)   SpO2 98%   BMI 45.69 kg/m , BMI Body mass index is 45.69 kg/m.  Wt Readings from Last 3 Encounters:  05/17/24 241 lb 12.8 oz (109.7 kg)  05/16/24 241 lb 9.6 oz (109.6 kg)  05/03/24 233 lb 11 oz (106 kg)    General: Patient appears comfortable at rest. HEENT: Conjunctiva and lids normal. Neck: Supple, no elevated JVP or carotid bruits. Lungs: Clear to auscultation, nonlabored breathing at rest. Cardiac: Regular rate and rhythm, no S3 or significant systolic murmur.  ECG:  An ECG dated 10/07/2023 was personally reviewed today and demonstrated:  Sinus rhythm with low voltage.  Labwork: 10/12/2023: BNP 16.6; Magnesium  2.0 11/01/2023: ALT 35; AST 30 12/24/2023: TSH 1.14 04/28/2024: BUN 27; Creatinine, Ser 0.77; Hemoglobin 13.5; Platelets PLATELET CLUMPS NOTED ON SMEAR, UNABLE TO ESTIMATE; Potassium 4.6; Sodium 137     Component Value Date/Time   CHOL 213 (H) 02/17/2023 0918   TRIG 104 02/17/2023 0918   HDL 83 02/17/2023 0918   LDLCALC 112 (H) 02/17/2023 0918  September 2025: Hemoglobin A1c 8%  Other Studies Reviewed  Today:  Echocardiogram 09/22/2023:  1. Left ventricular ejection fraction, by estimation, is 65 to 70%. The  left ventricle has normal function. The left ventricle has no regional  wall motion abnormalities. Left ventricular diastolic parameters were  normal. The average left ventricular  global longitudinal strain is -22.8 %. The global longitudinal strain is  normal.   2. Right ventricular systolic function is normal. The right ventricular  size is normal.   3. The mitral valve is normal in structure. No evidence of mitral valve  regurgitation. No evidence of mitral stenosis.   4. The aortic valve is tricuspid. Aortic valve regurgitation is not  visualized. No aortic stenosis is present.   5. The inferior vena cava is normal in size with greater than 50%  respiratory variability, suggesting right atrial pressure of 3 mmHg.   Coronary CTA 11/12/2023: IMPRESSION: 1. Coronary calcium score of 15.6. This was 46 percentile for age-, sex, and race-matched controls.   2. Total plaque volume 79 mm3 which is 48 percentile for age- and sex-matched controls (calcified plaque 6 mm3; non-calcified plaque 73 mm3). TPV is (mild).   3. Normal coronary origin with right dominance.   4. Minimal (0-24) calcified plaque in the LAD.  Assessment and Plan:  1.  Minor coronary atherosclerosis with calcium score 15.6 and mild total plaque volume by coronary CTA in March.SABRA  LVEF 65 to 70% by echocardiogram in January.  She has a history of noncardiac chest  pain with overall reassuring vascular evaluation so far.  I reviewed her ECG from February.  At this point would focus on risk factor reduction.  She is on Lipitor 80 mg daily and Zetia  10 mg daily.  Would aim for LDL below 70 if possible.  She is also trying to work on weight loss.  2.  History of palpitations.  This has been quiescent.   3.  Lower extremity edema.  Intermittent, she uses Lasix  40 mg tablets as needed.  Disposition:  Follow up 1  year.  Signed, Jayson JUDITHANN Sierras, M.D., F.A.C.C. [facility]

## 2024-05-19 NOTE — Telephone Encounter (Signed)
 Pharmacy Patient Advocate Encounter  Received notification from [facility] that Prior Authorization for Trulicity  0.75MG /0.5ML auto-injectors has been APPROVED from 05/18/2024 to 05/18/2025   PA #/Case ID/Reference #: [id number]

## 2024-05-22 NOTE — Telephone Encounter (Signed)
 Patient was called and a message was left sharing that she had been approved.

## 2024-05-31 ENCOUNTER — Other Ambulatory Visit: Payer: Self-pay | Admitting: Nurse Practitioner

## 2024-06-02 ENCOUNTER — Ambulatory Visit: Admitting: Obstetrics & Gynecology

## 2024-06-02 ENCOUNTER — Other Ambulatory Visit (HOSPITAL_COMMUNITY)
Admission: RE | Admit: 2024-06-02 | Discharge: 2024-06-02 | Disposition: A | Discharge status: Home / Self Care | Source: Ambulatory Visit | Attending: Obstetrics & Gynecology | Admitting: Obstetrics & Gynecology

## 2024-06-02 ENCOUNTER — Encounter: Payer: Self-pay | Admitting: Obstetrics & Gynecology

## 2024-06-02 VITALS — BP 119/79 | HR 70 | Ht 61.0 in | Wt 243.0 lb

## 2024-06-02 DIAGNOSIS — Z1151 Encounter for screening for human papillomavirus (HPV): Secondary | ICD-10-CM

## 2024-06-02 DIAGNOSIS — Z01411 Encounter for gynecological examination (general) (routine) with abnormal findings: Secondary | ICD-10-CM | POA: Diagnosis not present

## 2024-06-02 DIAGNOSIS — L309 Dermatitis, unspecified: Secondary | ICD-10-CM

## 2024-06-02 DIAGNOSIS — N898 Other specified noninflammatory disorders of vagina: Secondary | ICD-10-CM | POA: Insufficient documentation

## 2024-06-02 DIAGNOSIS — Z1331 Encounter for screening for depression: Secondary | ICD-10-CM | POA: Diagnosis not present

## 2024-06-02 MED ORDER — TRIAMCINOLONE ACETONIDE 0.5 % EX OINT: TOPICAL_OINTMENT | CUTANEOUS | 1 refills | Status: DC

## 2024-06-02 NOTE — Progress Notes (Signed)
 WELL-WOMAN EXAMINATION Patient name: Sarah Porter MRN [id number]  Date of birth: December 27, 1960 Chief Complaint:   Gynecologic Exam  History of Present Illness:   Sarah Porter is a 63 y.o. G2P2002 PM female being seen today for a routine well-woman exam.   Notes significant vaginal itching mostly on the outside and skin in general.   On occasion may note an odor, denies vaginal discharge.  It seems like this has been an ongoing issue- last mentioned in 2023.  Pt thought maybe an Rx had been sent in, but wasn't sure.  Still struggling with weight management.  Seeing endocrinology and working on meditation to help balance stress.  No LMP recorded. Patient is postmenopausal.  The current method of family planning is post menopausal status.    Last pap 2023.  Last mammogram: 05/2024. Last colonoscopy: 2022     06/02/2024    8:38 AM 12/16/2022    9:10 AM 11/05/2021    8:44 AM  Depression screen PHQ 2/9  Decreased Interest 2 1 0  Down, Depressed, Hopeless 2 1 0  PHQ - 2 Score 4 2 0  Altered sleeping 3 3 1   Tired, decreased energy 3 3 1   Change in appetite 3 1 0  Feeling bad or failure about yourself  1 2 0  Trouble concentrating 0 0 0  Moving slowly or fidgety/restless 0 0 0  Suicidal thoughts 0 0 0  PHQ-9 Score 14 11 2       Review of Systems:   Pertinent items are noted in HPI Denies any headaches, blurred vision, fatigue, shortness of breath, chest pain, abdominal pain, bowel movements, urination, or intercourse unless otherwise stated above.  Pertinent History Reviewed:  Reviewed past medical,surgical, social and family history.  Reviewed problem list, medications and allergies. Physical Assessment:   Vitals:   06/02/24 0828  BP: 119/79  Pulse: 70  Weight: 243 lb (110.2 kg)  Height: 5' 1 (1.549 m)  Body mass index is 45.91 kg/m.        Physical Examination:   General appearance - well appearing, and in no distress  Mental status - alert, oriented to person, place,  and time  Psych:  She has a normal mood and affect  Skin - warm and dry, normal color, no suspicious lesions noted  Chest - effort normal, all lung fields clear to auscultation bilaterally  Heart - normal rate and regular rhythm  Neck:  midline trachea, no thyromegaly or nodules  Breasts - breasts appear normal, no suspicious masses, no skin or nipple changes or  axillary nodes  Abdomen - soft, nontender, nondistended, no masses or organomegaly  Pelvic - @ groin- some hypopigmentation noted bilaterally VULVA: normal appearing vulva with no masses, tenderness or lesions  VAGINA: normal appearing vagina with normal color and discharge, no lesions  CERVIX: normal appearing cervix without discharge or lesions, no CMT  UTERUS: uterus is felt to be normal size, shape, consistency and nontender   ADNEXA: No adnexal masses or tenderness noted.  Extremities:  No swelling or varicosities noted  Chaperone: Alan Fischer     Assessment & Plan:  1) Well-Woman Exam -preventive screening up to date []  pap due 2026  2) Vaginal itching/irritation -plan to r/o infection -?dermatitis- will send in low dose steroid ointment to see if she notes any improvement, f/u prn  3) Weight management Reviewed conservative steps pt can take on her own May consider follow-up with healthy weight and wellness  No orders of the defined types  were placed in this encounter.   Meds:  Meds ordered this encounter  Medications   triamcinolone ointment (KENALOG) 0.5 %    Sig: Apply pea-sized amount to affected area twice daily as needed    Dispense:  30 g    Refill:  1    Follow-up: Return in about 1 year (around 06/02/2025) for Annual.   Lux Meaders, DO Attending Obstetrician & Gynecologist, Faculty Practice Center for [facility], [facility]

## 2024-06-05 ENCOUNTER — Ambulatory Visit: Payer: Self-pay | Admitting: Obstetrics & Gynecology

## 2024-06-05 ENCOUNTER — Ambulatory Visit (INDEPENDENT_AMBULATORY_CARE_PROVIDER_SITE_OTHER)

## 2024-06-05 ENCOUNTER — Ambulatory Visit (INDEPENDENT_AMBULATORY_CARE_PROVIDER_SITE_OTHER): Admitting: Podiatry

## 2024-06-05 DIAGNOSIS — M19071 Primary osteoarthritis, right ankle and foot: Secondary | ICD-10-CM | POA: Diagnosis not present

## 2024-06-05 DIAGNOSIS — M21861 Other specified acquired deformities of right lower leg: Secondary | ICD-10-CM

## 2024-06-05 LAB — CERVICOVAGINAL ANCILLARY ONLY
Bacterial Vaginitis (gardnerella): NEGATIVE
Candida Glabrata: NEGATIVE
Candida Vaginitis: NEGATIVE
Comment: NEGATIVE
Comment: NEGATIVE
Comment: NEGATIVE

## 2024-06-05 NOTE — Progress Notes (Signed)
 Chief Complaint  Patient presents with   Foot Pain    R foot in painful more in ankle. Injection did not seem to help. Not diabetic 81 mg Asprin    HPI: 63 y.o. female presenting today for follow-up evaluation of right foot and ankle pain.  She says the injections helped minimally.  She continues to have chronic pain and tenderness with activity.  She has a long history of arthritis.  Past Medical History:  Diagnosis Date   Anemia    Anxiety    Back pain    Chest pain    CHF (congestive heart failure) (HCC)    Constipation    Depression    Essential hypertension    Fibromyalgia    GERD (gastroesophageal reflux disease)    Hyperlipidemia    Joint pain    Osteoarthritis    Palpitations    Sleep apnea    SOB (shortness of breath)    Swelling of lower extremity    Type 2 diabetes mellitus (HCC)    Vertigo     Past Surgical History:  Procedure Laterality Date   BILATERAL KNEE REPAIRS     BIOPSY  07/05/2023   Procedure: BIOPSY;  Surgeon: Cindie Carlin POUR, DO;  [facility];  Service: Endoscopy;;   CESAREAN SECTION     x 2   COLONOSCOPY  11/04/2011   Dr. Fairy Glee; diminutive colon polyp in the rectum, otherwise normal exam.  Pathology with tubular adenoma.  Recommended 5-year repeat.   COLONOSCOPY  07/12/2014   Dr. Fairy Glee; submucosal hemorrhages in proximal descending colon?  Prior ischemic or infectious colitis?,  Otherwise normal exam.  Recommended 5-year repeat.   COLONOSCOPY WITH PROPOFOL  N/A 07/22/2021   Procedure: COLONOSCOPY WITH PROPOFOL ;  Surgeon: Cindie Carlin POUR, DO;  [facility]: [facility];  Service: Endoscopy;  Laterality: N/A;  8:45am   ESOPHAGEAL DILATION N/A 05/03/2024   Procedure: DILATION, ESOPHAGUS;  Surgeon: Cindie Carlin POUR, DO;  [facility]: [facility];  Service: Endoscopy;  Laterality: N/A;   ESOPHAGOGASTRODUODENOSCOPY  07/21/2004   Dr. Fairy Glee; grade 2 erosive esophagitis at EG junction, cervical inlet patch, otherwise  normal exam.   ESOPHAGOGASTRODUODENOSCOPY N/A 05/03/2024   Procedure: EGD (ESOPHAGOGASTRODUODENOSCOPY);  Surgeon: Cindie Carlin POUR, DO;  [facility];  Service: Endoscopy;  Laterality: N/A;  730AM, ASA 3   ESOPHAGOGASTRODUODENOSCOPY (EGD) WITH PROPOFOL  N/A 07/05/2023   Procedure: ESOPHAGOGASTRODUODENOSCOPY (EGD) WITH PROPOFOL ;  Surgeon: Cindie Carlin POUR, DO;  [facility];  Service: Endoscopy;  Laterality: N/A;  11:15 am, asa 3    No Known Allergies   Physical Exam: General: The patient is alert and oriented x3 in no acute distress.  Dermatology: Skin is warm, dry and supple bilateral lower extremities.   Vascular: Palpable pedal pulses bilaterally. Capillary refill within normal limits.  No appreciable edema.  No erythema.  Neurological: Grossly intact via light touch  Musculoskeletal Exam: There continues to be collapse of the medial longitudinal arch of the foot with weightbearing.  Rearfoot valgus deformity also noted.  When the foot is held in rectus position there is limited ankle joint dorsiflexion consistent with a tight posterior complex and Achilles.  Tenderness throughout palpation throughout the midtarsal joint of the  right foot  Radiographic Exam RT foot and ankle 06/05/2024:  Advanced degenerative changes noted to the midtarsal joints of the right foot consistent with arthritis.  No acute fractures identified.  Collapse of the medial longitudinal arch of the foot also  noted consistent with pes planovalgus deformity  Assessment/Plan of Care: 1.  Capsulitis right ankle 2.  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  Equinus deformity right 4.  Self-reported rheumatoid arthritis being managed by pain management  -Patient evaluated.  X-rays reviewed in detail today - Unfortunately the patient continues to have chronic pain and tenderness associated to the right foot and ankle despite conservative care.  She has tried  different shoe gear modifications as well as oral and injection anti-inflammatories.  I do believe it is appropriate at this time to discuss surgery which would include a triple arthrodesis to the right foot in combination with a tendo Achilles lengthening.  The surgery was discussed in detail with the patient including the risk benefits advantages and disadvantages of the procedure.  All patient questions were answered.  No guarantees were expressed or implied.  The patient consents would like to proceed with surgery.  Postoperative recovery course was also explained in detail.  She understands that she will be nonweightbearing minimum 8 weeks postoperatively -Managed by pain management. -Authorization for surgery was initiated today.  Surgery will consist of triple arthrodesis right.  Tendo Achilles lengthening right. -Return to clinic 1 week postop       Thresa EMERSON Sar, DPM [facility]  Dr. Thresa EMERSON Sar, DPM    [address] [place], [place] [address]                Office [phone]  Fax [phone]

## 2024-06-06 ENCOUNTER — Telehealth: Payer: Self-pay | Admitting: Podiatry

## 2024-06-06 NOTE — Telephone Encounter (Signed)
 Called patient in attempt to schedule surgery with Dr.Evans. Transferred to voicemail but after recording played it cut off.

## 2024-06-07 ENCOUNTER — Other Ambulatory Visit: Payer: Self-pay | Admitting: Nurse Practitioner

## 2024-06-07 MED ORDER — EZETIMIBE 10 MG PO TABS: 10.0000 mg | ORAL_TABLET | Freq: Every day | ORAL | 2 refills | Status: AC

## 2024-06-16 DIAGNOSIS — F419 Anxiety disorder, unspecified: Secondary | ICD-10-CM | POA: Diagnosis not present

## 2024-06-16 DIAGNOSIS — F329 Major depressive disorder, single episode, unspecified: Secondary | ICD-10-CM | POA: Diagnosis not present

## 2024-06-19 DIAGNOSIS — Z79899 Other long term (current) drug therapy: Secondary | ICD-10-CM | POA: Diagnosis not present

## 2024-06-19 DIAGNOSIS — M797 Fibromyalgia: Secondary | ICD-10-CM | POA: Diagnosis not present

## 2024-06-19 DIAGNOSIS — Z79891 Long term (current) use of opiate analgesic: Secondary | ICD-10-CM | POA: Diagnosis not present

## 2024-06-19 DIAGNOSIS — M2550 Pain in unspecified joint: Secondary | ICD-10-CM | POA: Diagnosis not present

## 2024-06-19 DIAGNOSIS — M150 Primary generalized (osteo)arthritis: Secondary | ICD-10-CM | POA: Diagnosis not present

## 2024-06-19 DIAGNOSIS — G894 Chronic pain syndrome: Secondary | ICD-10-CM | POA: Diagnosis not present

## 2024-06-20 ENCOUNTER — Other Ambulatory Visit: Payer: Self-pay | Admitting: Gastroenterology

## 2024-06-20 DIAGNOSIS — K2100 Gastro-esophageal reflux disease with esophagitis, without bleeding: Secondary | ICD-10-CM

## 2024-06-23 ENCOUNTER — Telehealth: Payer: Self-pay | Admitting: Podiatry

## 2024-06-23 NOTE — Telephone Encounter (Signed)
 Called patient to schedule surgery- patient is scheduled for 08/17/2024. Patient is not on any blood thinner/GLP1 medications. Patient states that all scripts post op will be handled by her pain management provider. Patient is wanting to know if something can be administered into the iv for nausea as she states this helps post op.

## 2024-06-26 DIAGNOSIS — M170 Bilateral primary osteoarthritis of knee: Secondary | ICD-10-CM | POA: Diagnosis not present

## 2024-06-26 DIAGNOSIS — G8929 Other chronic pain: Secondary | ICD-10-CM | POA: Diagnosis not present

## 2024-06-26 DIAGNOSIS — M25562 Pain in left knee: Secondary | ICD-10-CM | POA: Diagnosis not present

## 2024-06-26 DIAGNOSIS — M25561 Pain in right knee: Secondary | ICD-10-CM | POA: Diagnosis not present

## 2024-06-27 ENCOUNTER — Encounter: Payer: Self-pay | Admitting: Gastroenterology

## 2024-06-27 ENCOUNTER — Ambulatory Visit: Admitting: Gastroenterology

## 2024-06-27 VITALS — BP 135/85 | HR 85 | Temp 98.7°F | Ht 61.0 in | Wt 237.4 lb

## 2024-06-27 DIAGNOSIS — R7989 Other specified abnormal findings of blood chemistry: Secondary | ICD-10-CM

## 2024-06-27 DIAGNOSIS — K5903 Drug induced constipation: Secondary | ICD-10-CM

## 2024-06-27 DIAGNOSIS — K2100 Gastro-esophageal reflux disease with esophagitis, without bleeding: Secondary | ICD-10-CM | POA: Diagnosis not present

## 2024-06-27 DIAGNOSIS — R112 Nausea with vomiting, unspecified: Secondary | ICD-10-CM | POA: Diagnosis not present

## 2024-06-27 DIAGNOSIS — K219 Gastro-esophageal reflux disease without esophagitis: Secondary | ICD-10-CM

## 2024-06-27 DIAGNOSIS — R1310 Dysphagia, unspecified: Secondary | ICD-10-CM | POA: Insufficient documentation

## 2024-06-27 DIAGNOSIS — R1319 Other dysphagia: Secondary | ICD-10-CM

## 2024-06-27 DIAGNOSIS — K5900 Constipation, unspecified: Secondary | ICD-10-CM | POA: Insufficient documentation

## 2024-06-27 MED ORDER — ONDANSETRON HCL 4 MG PO TABS: 4.0000 mg | ORAL_TABLET | Freq: Four times a day (QID) | ORAL | 1 refills | Status: DC | PRN

## 2024-06-27 MED ORDER — ESOMEPRAZOLE MAGNESIUM 40 MG PO CPDR: 40.0000 mg | DELAYED_RELEASE_CAPSULE | Freq: Two times a day (BID) | ORAL | 1 refills | Status: DC

## 2024-06-27 MED ORDER — LUBIPROSTONE 24 MCG PO CAPS: 24.0000 ug | ORAL_CAPSULE | Freq: Two times a day (BID) | ORAL | 5 refills | Status: AC

## 2024-06-27 NOTE — Progress Notes (Signed)
 GI Office Note    Referring Provider: Cristine Mt, MD Primary Care Physician:  Ziomek, Paul, MD  Primary Gastroenterologist: Carlin POUR. Cindie, DO   Chief Complaint   Chief Complaint  Patient presents with   New Patient (Initial Visit)    Pt is having abd pain and nausea. Pt is having the number 1 on the stool chart. Pt has been having the abd pain and nausea a few weeks    History of Present Illness   Sarah Porter is a 63 y.o. female presenting today for abdominal pain and nausea. Last seen 01/2024. She has history of GERD, Barrett's esophagus, dysphagia, constipation in the setting of opioids.  Discussed the use of AI scribe software for clinical note transcription with the patient, who gave verbal consent to proceed.     She has experienced ongoing swallowing difficulties for over a year, with episodes where food, such as pretzels and Italian ice, gets stuck in her esophagus, leading to burping and discomfort. 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 EGD/ED as outlined below, no improvement in symptoms post-dilation. She is cautious with her diet, avoiding foods that exacerbate her symptoms.  She experiences nausea and has had episodes of vomiting, most recently last Friday, which left her bedridden over the weekend. The nausea persists despite the resolution of vomiting. She is taking ondansetron  three times a day, which she finds helpful. She also reports burning stomach pain in the epigastric area especially after consuming spicy foods. She is also on esomeprazole  twice daily and  pepcid at bedtime. Takes Tums for breakthrough heartburn, which she experiences daily. She avoids spicy foods to prevent exacerbation of her symptoms. Does not remember how Voquezna samples did. Previously on omeprazole  and pantoprazole  BID.  She experiences severe chest pains that radiate to her back and jaw, occurring at least once a month. She is on Trulicity  for insulin  resistance, which  she restarted two weeks ago. She questions whether it contributes to her nausea, although she did not experience these symptoms during previous use.    She has been to the ER multiple times for chest pain and shortness of breath, with no cardiac issues identified.  She is taking Linzess inconsistently due to concerns about timing and side effects, such as severe stomach cramps. She has only a sample bottle and has not had a prescription. No melena, brbpr.  She is currently taking care of her father-in-law after her mother-in-law passed away, which has impacted her ability to manage her own health.   Prior Data   EGD 05/2024 with small hh, gastritis (neg h.pylori), esophageal dilation, and biopsies from middle third of esophagus taken, c/w reflux. Advised PPI BID.   Prior EGD in November 2024 that showed mild reflux esophagitis, short segment Barrett's esophagus and gastritis with biopsies negative for H. pylori.   CT A/P with contrast 10/2023 showed nonobstructing left nephrolithiasis.    Labs in March 2025: alk phos 127, ALT 35. Other LFTs, GGT, lipase within normal limits.    HIDA March 2025 with mild biliary hyperkinesia.    Cholecystectomy 12/03/23.  No significant abnormalities on path.   GES 12/2023 normal.  Medications   Current Outpatient Medications  Medication Sig Dispense Refill   aspirin EC 81 MG tablet Take 81 mg by mouth daily. Swallow whole.     atorvastatin (LIPITOR) 80 MG tablet Take 80 mg by mouth in the morning.     bisacodyl (DULCOLAX) 5 MG EC tablet Take 5 mg by  mouth daily as needed for moderate constipation.     busPIRone (BUSPAR) 10 MG tablet Take 10 mg by mouth 3 (three) times daily.     cholecalciferol (VITAMIN D3) 25 MCG (1000 UNIT) tablet Take 1,000 Units by mouth daily.     Continuous Glucose Sensor (DEXCOM G7 SENSOR) MISC      Continuous Glucose Sensor (DEXCOM G7 SENSOR) MISC Inject 1 Application into the skin as directed. Change sensor every 10 days as  directed. 9 each 3   Dulaglutide  (TRULICITY ) 0.75 MG/0.5ML SOAJ Inject 0.75 mg into the skin once a week. 2 mL 1   DULoxetine (CYMBALTA) 60 MG capsule Take 60 mg by mouth daily.     ELDERBERRY PO Take 1 tablet by mouth daily.     esomeprazole  (NEXIUM ) 40 MG capsule TAKE 1 CAPSULE BY MOUTH TWICE DAILY BEFORE A MEAL 60 capsule 0   ezetimibe  (ZETIA ) 10 MG tablet Take 1 tablet (10 mg total) by mouth daily. 90 tablet 2   famotidine (PEPCID) 20 MG tablet Take 20 mg by mouth daily.     Magnesium  250 MG TABS Take 250 mg by mouth daily.     Magnesium  Oxide 250 MG TABS Take 1 tablet by mouth daily.     Multiple Vitamin (MULTIVITAMIN WITH MINERALS) TABS tablet Take 1 tablet by mouth in the morning.     ondansetron  (ZOFRAN ) 4 MG tablet Take 1 tablet (4 mg total) by mouth every 8 (eight) hours as needed for nausea or vomiting. 30 tablet 1   Oxycodone  HCl 10 MG TABS Take 1 tablet by mouth 2 (two) times daily.     potassium chloride  SA (KLOR-CON  M) 20 MEQ tablet Take 1 tablet by mouth twice daily 180 tablet 3   pregabalin (LYRICA) 100 MG capsule Take 100 mg by mouth 2 (two) times daily.     pregabalin (LYRICA) 50 MG capsule Take 100 mg by mouth 2 (two) times daily.     traZODone (DESYREL) 50 MG tablet Take 75-100 mg by mouth at bedtime as needed for sleep.     triamcinolone ointment (KENALOG) 0.5 % Apply pea-sized amount to affected area twice daily as needed 30 g 1   UBRELVY 100 MG TABS Take 100 mg by mouth daily as needed (migraine).     vitamin C (ASCORBIC ACID) 500 MG tablet Take 500 mg by mouth in the morning.     Dulaglutide  (TRULICITY ) 1.5 MG/0.5ML SOAJ Inject 1.5 mg into the skin once a week. (Patient not taking: Reported on 06/27/2024) 6 mL 1   No current facility-administered medications for this visit.    Allergies   Allergies as of 06/27/2024   (No Known Allergies)        Review of Systems   General: Negative for anorexia, weight loss, fever, chills, fatigue, weakness. ENT: Negative  for hoarseness, difficulty swallowing , nasal congestion.see hpi CV: Negative for chest pain, angina, palpitations, dyspnea on exertion, peripheral edema.  Respiratory: Negative for dyspnea at rest, dyspnea on exertion, cough, sputum, wheezing.  GI: See history of present illness. GU:  Negative for dysuria, hematuria, urinary incontinence, urinary frequency, nocturnal urination.  Endo: Negative for unusual weight change.     Physical Exam   BP 135/85   Pulse 85   Temp 98.7 F (37.1 C)   Ht 5' 1 (1.549 m)   Wt 237 lb 6.4 oz (107.7 kg)   BMI 44.86 kg/m    General: Well-nourished, well-developed in no acute distress. Tearful. Eyes: No icterus.  Mouth: Oropharyngeal mucosa moist and pink   Lungs: Clear to auscultation bilaterally.  Heart: Regular rate and rhythm, no murmurs rubs or gallops.  Abdomen: Bowel sounds are normal, nontender, nondistended, no hepatosplenomegaly or masses,  no abdominal bruits or hernia , no rebound or guarding.  Rectal: not performed Extremities: No lower extremity edema. No clubbing or deformities. Neuro: Alert and oriented x 4   Skin: Warm and dry, no jaundice.   Psych: Alert and cooperative, normal mood and affect.  Labs   Lab Results  Component Value Date   NA 137 04/28/2024   CL 103 04/28/2024   K 4.6 04/28/2024   CO2 21 (L) 04/28/2024   BUN 27 (H) 04/28/2024   CREATININE 0.77 04/28/2024   GFRNONAA >60 04/28/2024   CALCIUM 8.9 04/28/2024   ALBUMIN 4.6 11/01/2023   GLUCOSE 121 (H) 04/28/2024   Lab Results  Component Value Date   ALT 35 (H) 11/01/2023   AST 30 11/01/2023   GGT 33 11/01/2023   ALKPHOS 127 (H) 11/01/2023   BILITOT 0.8 11/01/2023   Lab Results  Component Value Date   WBC 10.1 04/28/2024   HGB 13.5 04/28/2024   HCT 41.7 04/28/2024   MCV 86.2 04/28/2024   PLT PLATELET CLUMPS NOTED ON SMEAR, UNABLE TO ESTIMATE 04/28/2024    Imaging Studies   DG Foot Complete Right Result Date: 06/05/2024 Please see detailed  radiograph report in office note.  DG Ankle 2 Views Right Result Date: 06/05/2024 Please see detailed radiograph report in office note.   Assessment/Plan:    Dysphagia   Persistent dysphagia. 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 EGD without apparent web/stricture and dilation did not help. Differential includes esophageal motility disorder, persistent web (less likely). Severe chest pains, possibly esophageal spasms, are increasing in frequency.   -consider esophageal manometry to evaluate for motility disorder due to dysphagia and possible esophageal spasms.   Gastroesophageal reflux disease (GERD) GERD with heartburn and acid reflux, exacerbated by spicy foods. Managed with esomeprazole  and Tums. Previous use of Voquezna not recalled. - Continue esomeprazole  40mg  twice daily before meals. - Use Tums as needed for breakthrough symptoms. - use famotidine 20mg  up to BID prn.  Nausea, multifactorial Intermittent nausea and vomiting, possibly related to GERD, Trulicity , or other medications. Poorly controlled diabetes could be playing a role. Ondansetron  provides some relief. Recent episode of vomiting without flu-like symptoms. - Continue ondansetron  as needed for nausea.   Chronic constipation   Chronic constipation likely exacerbated by Trulicity  and oxycodone . Inconsistent use of Linzess due to lifestyle constraints, with previous use causing stomach cramps.   -lubiprostone 24mcg twice daily with food.     Elevated LFTS: Mild elevation of alk phos on last two checks. Mild intermittent elevation of ALT. GGT normal. Viral markers negative for Hep B or Hep C. No iron overload. Liver normal on CT with contrast 10/2023. By u/s, hepatic steatosis 06/2023.  -obtain updated LFTs, additional serologies  Sonny RAMAN. Ezzard, MHS, PA-C [facility]

## 2024-06-27 NOTE — Patient Instructions (Signed)
 Continue esomeprazole  40mg  twice daily before meals. You can use famotidine 20mg  up to twice daily for breakthrough acid reflux symptoms. I have sent in odansetron 4mg  to take every 6 hours as needed for nausea. I have sent I lubiprostone 24mcg to take twice daily with food for constipation. Try taking every day and get through the first week due to wash out period. However, if having more than 4 loose stools daily you can skip a day or reduce to once daily dosing.  You can go to [facility] for labs anytime after this week. I will let you know next step to evaluate your swallowing issues.

## 2024-06-29 ENCOUNTER — Other Ambulatory Visit (HOSPITAL_BASED_OUTPATIENT_CLINIC_OR_DEPARTMENT_OTHER): Payer: Self-pay

## 2024-07-01 ENCOUNTER — Telehealth: Payer: Self-pay | Admitting: Gastroenterology

## 2024-07-01 DIAGNOSIS — R1319 Other dysphagia: Secondary | ICD-10-CM

## 2024-07-01 NOTE — Telephone Encounter (Signed)
 Sarah Porter: I placed labs after patient left the office. She is aware to go to [facility].   Please let patient know that next step for evaluating her dysphagia is esophageal manometry.  If patient agreeable, schedule esophageal manometry.

## 2024-07-03 NOTE — Telephone Encounter (Signed)
 Pt was made aware and is ok with doing esophageal manometry.

## 2024-07-03 NOTE — Telephone Encounter (Signed)
Referral placed to [facility] °

## 2024-07-03 NOTE — Addendum Note (Signed)
 Addended by: JEANELL GRAEME RAMAN on: 07/03/2024 02:37 PM   Modules accepted: Orders

## 2024-07-04 DIAGNOSIS — R112 Nausea with vomiting, unspecified: Secondary | ICD-10-CM | POA: Diagnosis not present

## 2024-07-04 DIAGNOSIS — R1319 Other dysphagia: Secondary | ICD-10-CM | POA: Diagnosis not present

## 2024-07-04 DIAGNOSIS — K2100 Gastro-esophageal reflux disease with esophagitis, without bleeding: Secondary | ICD-10-CM | POA: Diagnosis not present

## 2024-07-04 DIAGNOSIS — R7989 Other specified abnormal findings of blood chemistry: Secondary | ICD-10-CM | POA: Diagnosis not present

## 2024-07-04 DIAGNOSIS — K219 Gastro-esophageal reflux disease without esophagitis: Secondary | ICD-10-CM | POA: Diagnosis not present

## 2024-07-04 DIAGNOSIS — K5903 Drug induced constipation: Secondary | ICD-10-CM | POA: Diagnosis not present

## 2024-07-15 LAB — IGG, IGA, IGM
IgG (Immunoglobin G), Serum: 861 mg/dL (ref 586–1602)
IgM (Immunoglobulin M), Srm: 110 mg/dL (ref 26–217)
Immunoglobulin A, (IgA) QN, Serum: 134 mg/dL (ref 87–352)

## 2024-07-15 LAB — HEPATIC FUNCTION PANEL
ALT: 32 IU/L (ref 0–32)
AST: 24 IU/L (ref 0–40)
Albumin: 4.5 g/dL (ref 3.9–4.9)
Alkaline Phosphatase: 147 IU/L — ABNORMAL HIGH (ref 49–135)
Bilirubin Total: 0.6 mg/dL (ref 0.0–1.2)
Bilirubin, Direct: 0.2 mg/dL (ref 0.00–0.40)
Total Protein: 7 g/dL (ref 6.0–8.5)

## 2024-07-15 LAB — PRIMARY BILIARY CHOLANGITIS PR
Anti-GP-210 Ab (RDL): <20 U (ref <20)
Anti-Mitochondrial Ab by IFA: <1:20 {titer}
Anti-Mitochondrial M2 Ab (RDL): <20 U (ref <20)
Anti-Nuclear Ab by IFA (RDL): POSITIVE — AB
Anti-SP-100 Ab (RDL): <20 U (ref <20)
Anti-Smooth Muscle Ab by IFA: 1:40 {titer} — ABNORMAL HIGH

## 2024-07-15 LAB — HEPATITIS A ANTIBODY, TOTAL: hep A Total Ab: NEGATIVE

## 2024-07-15 LAB — ANA TITER AND PATTERN: Speckled Pattern: 1:40 {titer} — ABNORMAL HIGH

## 2024-07-15 LAB — TISSUE TRANSGLUTAMINASE, IGA: t-Transglutaminase (tTG) IgA: <2 U/mL (ref 0–3)

## 2024-07-16 ENCOUNTER — Other Ambulatory Visit: Payer: Self-pay | Admitting: Nurse Practitioner

## 2024-07-16 DIAGNOSIS — E119 Type 2 diabetes mellitus without complications: Secondary | ICD-10-CM

## 2024-07-18 ENCOUNTER — Telehealth: Payer: Self-pay | Admitting: Podiatry

## 2024-07-18 NOTE — Telephone Encounter (Signed)
 Patient is calling in regards to knee scooter. She is wanting to know if there is another alternative she could use instead as she has issues with her knees and is unable to keep her leg in the position that the scooter would require. Please advise

## 2024-07-21 ENCOUNTER — Telehealth: Payer: Self-pay | Admitting: Podiatry

## 2024-07-21 NOTE — Telephone Encounter (Signed)
 DOS- 08/17/2024  TENDO-ACHILLES LENGTH RT- 72314 TRIPLE ARTHRODESIS RT- 71284  [employer] EFFECTIVE DATE- 09/01/2023  DEDUCTIBLE- $500 REMAINING- $0 OOP- $3000 REMAINING- $0 FAMILY DEDUCTIBLE- $1000 REMAINING- $500 FAMILY OOP- $6000 REMAINING- $3000 COINSURANCE- 30%  PER AVAILITY PORTAL, NO PRIOR AUTHS ARE REQUIRED FOR CPT CODES 72314 AND [phone]. DOCUMENTATION ATTACHED TO SURGERY CONSENT PACKET.

## 2024-07-24 ENCOUNTER — Encounter: Payer: Self-pay | Admitting: Internal Medicine

## 2024-07-24 DIAGNOSIS — F339 Major depressive disorder, recurrent, unspecified: Secondary | ICD-10-CM | POA: Diagnosis not present

## 2024-07-24 DIAGNOSIS — Z79899 Other long term (current) drug therapy: Secondary | ICD-10-CM | POA: Diagnosis not present

## 2024-07-24 DIAGNOSIS — F419 Anxiety disorder, unspecified: Secondary | ICD-10-CM | POA: Diagnosis not present

## 2024-07-25 ENCOUNTER — Ambulatory Visit: Payer: Self-pay | Admitting: Gastroenterology

## 2024-07-25 MED ORDER — RABEPRAZOLE SODIUM 20 MG PO TBEC: DELAYED_RELEASE_TABLET | ORAL | 1 refills | Status: AC

## 2024-07-25 NOTE — Addendum Note (Signed)
 Addended by: EZZARD SONNY RAMAN on: 07/25/2024 09:52 PM   Modules accepted: Orders

## 2024-07-25 NOTE — Telephone Encounter (Signed)
 Patient called in stating  and they could not get her in until January and she was not sure if she would have insurance at that time as she has not chose a plan yet for 2026. She did not schedule an appointment and then have to cancel it. She wants to know if something else could be done for her or if any meds she can take for her symptoms while she waits to see what she does about her insurance? Patient aware Sonny is out of the office until next week to get recs and she stated she has been dealing with this for a year now so she is fine with a call back next week.   FYI Called [facility] and they are currently booking in April/May/June for manometry

## 2024-07-25 NOTE — Telephone Encounter (Signed)
 We can try switching her PPI (the esomeprazole ). She was still having breakthrough reflux sx which can exacerbate esophageal spasms which can cause dysphagia and chest pain.   She should schedule esophageal manometry when she can.  Stop esomeprazole . Start rabeprazole  30 minutes before breakfast and supper. RX sent. Continue famotidine twice daily.  See lab result note Tammy C.

## 2024-07-26 ENCOUNTER — Other Ambulatory Visit: Payer: Self-pay

## 2024-07-26 DIAGNOSIS — R7989 Other specified abnormal findings of blood chemistry: Secondary | ICD-10-CM

## 2024-07-26 NOTE — Telephone Encounter (Signed)
 Pt was made aware. See result note.

## 2024-08-01 ENCOUNTER — Telehealth: Payer: Self-pay

## 2024-08-01 ENCOUNTER — Other Ambulatory Visit: Payer: Self-pay

## 2024-08-01 DIAGNOSIS — E119 Type 2 diabetes mellitus without complications: Secondary | ICD-10-CM

## 2024-08-01 MED ORDER — DEXCOM G7 SENSOR MISC: 1.0000 | 0 refills | Status: DC

## 2024-08-01 NOTE — Telephone Encounter (Signed)
 Pt called requesting confirmation of what type of eye exam she needs. Advised pt she needs a diabetic eye exam, checking for retinopathy per Whitney Reardon,FNP. Pt voiced understanding.

## 2024-08-07 ENCOUNTER — Other Ambulatory Visit: Payer: Self-pay

## 2024-08-07 ENCOUNTER — Telehealth: Payer: Self-pay | Admitting: Gastroenterology

## 2024-08-07 DIAGNOSIS — N898 Other specified noninflammatory disorders of vagina: Secondary | ICD-10-CM

## 2024-08-07 DIAGNOSIS — M2550 Pain in unspecified joint: Secondary | ICD-10-CM | POA: Diagnosis not present

## 2024-08-07 DIAGNOSIS — G894 Chronic pain syndrome: Secondary | ICD-10-CM | POA: Diagnosis not present

## 2024-08-07 DIAGNOSIS — M150 Primary generalized (osteo)arthritis: Secondary | ICD-10-CM | POA: Diagnosis not present

## 2024-08-07 DIAGNOSIS — L309 Dermatitis, unspecified: Secondary | ICD-10-CM

## 2024-08-07 DIAGNOSIS — Z79899 Other long term (current) drug therapy: Secondary | ICD-10-CM | POA: Diagnosis not present

## 2024-08-07 DIAGNOSIS — M797 Fibromyalgia: Secondary | ICD-10-CM | POA: Diagnosis not present

## 2024-08-07 MED ORDER — TRIAMCINOLONE ACETONIDE 0.5 % EX OINT: TOPICAL_OINTMENT | CUTANEOUS | 1 refills | Status: AC

## 2024-08-07 NOTE — Telephone Encounter (Signed)
 Good Morning Dr. Stacia,  I received a call from this patient stating that she was referred to our office for Esophageal Dysphagia. Patient is a current patient of [facility]. Would you please advise on how to schedule this patient.   Thank you.

## 2024-08-08 ENCOUNTER — Telehealth: Payer: Self-pay

## 2024-08-08 DIAGNOSIS — R112 Nausea with vomiting, unspecified: Secondary | ICD-10-CM

## 2024-08-08 MED ORDER — ONDANSETRON HCL 4 MG PO TABS: 4.0000 mg | ORAL_TABLET | Freq: Four times a day (QID) | ORAL | 3 refills | Status: DC | PRN

## 2024-08-08 NOTE — Addendum Note (Signed)
 Addended by: EZZARD SONNY RAMAN on: 08/08/2024 07:09 PM   Modules accepted: Orders

## 2024-08-08 NOTE — Telephone Encounter (Signed)
 Refill request for ONDANSETRON  4MG  TAB QTY:90 was received via fax from [facility] in [place]. Pt was last seen on 06/27/24.

## 2024-08-09 NOTE — Telephone Encounter (Signed)
 OK to schedule as direct procedure esophageal manometry for evaluation of dysphagia at [facility] long.  Patient is established with [facility]

## 2024-08-10 ENCOUNTER — Telehealth: Payer: Self-pay | Admitting: Podiatry

## 2024-08-10 ENCOUNTER — Other Ambulatory Visit: Payer: Self-pay

## 2024-08-10 DIAGNOSIS — R1310 Dysphagia, unspecified: Secondary | ICD-10-CM

## 2024-08-10 DIAGNOSIS — K219 Gastro-esophageal reflux disease without esophagitis: Secondary | ICD-10-CM

## 2024-08-10 NOTE — Telephone Encounter (Signed)
 Patient contacted me in regards to knee scooter that was submitted through [employer]. Patient states she was contacted by [employer] and informed that the item was not covered through her insurance plan. Patient then started yelling at me about how she's contacted her insurance company and met her deductible and something about her coinsurance. I pulled up patient's order on parachute and the ordered was cancelled by supplier due to her denying the equipment. Patient states her insurance was never ran for the item. I tried explaining to patient that it would not show up due to her declining the knee scooter through the supplier. Patient wanted to know why we hadn't submitted it through our office either. I tried explaining to patient that the DME supplier is  a separate entity and that right now here best options we're either reach out to insurance to find out what DME supplier were in network or to go through [facility]. Patient said that she didn't want to pay out of pocket and would not look on [employer].She then kept asking me what she needed to ask her insurance. I repeated she needed to call her insurance to see what supplier was in network and she just kept asking what does she need to say. She then asked why do people not want to help out. I told patient that I was doing everything on my end in regards to getting  it ordered and that I had no idea what was discussed between her and the employee from [employer].

## 2024-08-10 NOTE — Telephone Encounter (Signed)
 Patient contacted and advised of her esophageal manometry appointment 11/15/24 at 10:30 am. Instructions reviewed verbally. Written instructions mailed.

## 2024-08-14 DIAGNOSIS — N182 Chronic kidney disease, stage 2 (mild): Secondary | ICD-10-CM | POA: Diagnosis not present

## 2024-08-14 DIAGNOSIS — E785 Hyperlipidemia, unspecified: Secondary | ICD-10-CM | POA: Diagnosis not present

## 2024-08-14 DIAGNOSIS — E119 Type 2 diabetes mellitus without complications: Secondary | ICD-10-CM | POA: Diagnosis not present

## 2024-08-14 DIAGNOSIS — I10 Essential (primary) hypertension: Secondary | ICD-10-CM | POA: Diagnosis not present

## 2024-08-15 ENCOUNTER — Encounter: Payer: Self-pay | Admitting: Nurse Practitioner

## 2024-08-15 ENCOUNTER — Other Ambulatory Visit: Payer: Self-pay | Admitting: Nurse Practitioner

## 2024-08-15 ENCOUNTER — Telehealth: Payer: Self-pay | Admitting: Podiatry

## 2024-08-15 ENCOUNTER — Ambulatory Visit: Admitting: Nurse Practitioner

## 2024-08-15 VITALS — BP 120/68 | HR 65 | Ht 61.0 in | Wt 240.6 lb

## 2024-08-15 DIAGNOSIS — Z7985 Long-term (current) use of injectable non-insulin antidiabetic drugs: Secondary | ICD-10-CM | POA: Diagnosis not present

## 2024-08-15 DIAGNOSIS — I10 Essential (primary) hypertension: Secondary | ICD-10-CM

## 2024-08-15 DIAGNOSIS — E559 Vitamin D deficiency, unspecified: Secondary | ICD-10-CM | POA: Diagnosis not present

## 2024-08-15 DIAGNOSIS — E119 Type 2 diabetes mellitus without complications: Secondary | ICD-10-CM | POA: Diagnosis not present

## 2024-08-15 DIAGNOSIS — E782 Mixed hyperlipidemia: Secondary | ICD-10-CM

## 2024-08-15 LAB — POCT GLYCOSYLATED HEMOGLOBIN (HGB A1C)

## 2024-08-15 MED ORDER — TRULICITY 1.5 MG/0.5ML ~~LOC~~ SOAJ: 1.5000 mg | SUBCUTANEOUS | 1 refills | Status: DC

## 2024-08-15 MED ORDER — DEXCOM G7 SENSOR MISC: 3 refills | Status: AC

## 2024-08-15 NOTE — Telephone Encounter (Signed)
 Patient called and said she is unable to keep surgery as scheduled for this Thursday due to a medical issue with her fiance and him having to have emergency surgery. Patient will call office back to r/s once able and [facility] has been notified.

## 2024-08-15 NOTE — Progress Notes (Signed)
 Endocrinology Follow Up Note       08/15/2024, 8:24 AM   Subjective:    Patient ID: Sarah Porter, female    DOB: [birth year].  Sarah Porter is being seen in follow up after being seen in consultation for management of currently uncontrolled symptomatic diabetes requested by  Ziomek, Paul, MD.   Past Medical History:  Diagnosis Date   Anemia    Anxiety    Back pain    Chest pain    CHF (congestive heart failure) (HCC)    Constipation    Depression    Essential hypertension    Fibromyalgia    GERD (gastroesophageal reflux disease)    Hyperlipidemia    Joint pain    Osteoarthritis    Palpitations    Sleep apnea    SOB (shortness of breath)    Swelling of lower extremity    Type 2 diabetes mellitus (HCC)    Vertigo     Past Surgical History:  Procedure Laterality Date   BILATERAL KNEE REPAIRS     BIOPSY  07/05/2023   Procedure: BIOPSY;  Surgeon: Cindie Carlin POUR, DO;  [facility];  Service: Endoscopy;;   CESAREAN SECTION     x 2   COLONOSCOPY  11/04/2011   Dr. Fairy Glee; diminutive colon polyp in the rectum, otherwise normal exam.  Pathology with tubular adenoma.  Recommended 5-year repeat.   COLONOSCOPY  07/12/2014   Dr. Fairy Glee; submucosal hemorrhages in proximal descending colon?  Prior ischemic or infectious colitis?,  Otherwise normal exam.  Recommended 5-year repeat.   COLONOSCOPY WITH PROPOFOL  N/A 07/22/2021   Procedure: COLONOSCOPY WITH PROPOFOL ;  Surgeon: Cindie Carlin POUR, DO;  [facility]: [facility];  Service: Endoscopy;  Laterality: N/A;  8:45am   ESOPHAGEAL DILATION N/A 05/03/2024   Procedure: DILATION, ESOPHAGUS;  Surgeon: Cindie Carlin POUR, DO;  [facility]: [facility];  Service: Endoscopy;  Laterality: N/A;   ESOPHAGOGASTRODUODENOSCOPY  07/21/2004   Dr. Fairy Glee; grade 2 erosive esophagitis at EG junction, cervical inlet patch, otherwise normal exam.    ESOPHAGOGASTRODUODENOSCOPY N/A 05/03/2024   Procedure: EGD (ESOPHAGOGASTRODUODENOSCOPY);  Surgeon: Cindie Carlin POUR, DO;  [facility];  Service: Endoscopy;  Laterality: N/A;  730AM, ASA 3   ESOPHAGOGASTRODUODENOSCOPY (EGD) WITH PROPOFOL  N/A 07/05/2023   Procedure: ESOPHAGOGASTRODUODENOSCOPY (EGD) WITH PROPOFOL ;  Surgeon: Cindie Carlin POUR, DO;  [facility]: [facility];  Service: Endoscopy;  Laterality: N/A;  11:15 am, asa 3    Social History   Socioeconomic History   Marital status: Significant Other    Spouse name: Not on file   Number of children: Not on file   Years of education: Not on file   Highest education level: Not on file  Occupational History   Not on file  Tobacco Use   Smoking status: Former    Current packs/day: 0.00    Types: Cigarettes    Quit date: 09/01/1999    Years since quitting: 24.9    Passive exposure: Never   Smokeless tobacco: Never   Tobacco comments:    smoked for approx 20 years   Vaping Use   Vaping status: Never Used  Substance and Sexual Activity  Alcohol use: Not Currently   Drug use: Yes    Frequency: 2.0 times per week    Types: Marijuana    Comment: occasionally-cbd   Sexual activity: Yes    Birth control/protection: Post-menopausal  Other Topics Concern   Not on file  Social History Narrative   Are you right handed or left handed? Right   Are you currently employed ? Retired   What is your current occupation?   Do you live at home alone? NO    Who lives with you?    What type of home do you live in: 1 story or 2 story? 1       Social Drivers of Health   Tobacco Use: Medium Risk (08/15/2024)   Patient History    Smoking Tobacco Use: Former    Smokeless Tobacco Use: Never    Passive Exposure: Never  Physicist, Medical Strain: Low Risk (12/16/2022)   Overall Financial Resource Strain (CARDIA)    Difficulty of Paying Living Expenses: Not very hard  Food Insecurity: No Food Insecurity (06/02/2024)   Epic    Worried  About Programme Researcher, Broadcasting/film/video in the Last Year: Never true    Ran Out of Food in the Last Year: Never true  Transportation Needs: No Transportation Needs (06/02/2024)   Epic    Lack of Transportation (Medical): No    Lack of Transportation (Non-Medical): No  Physical Activity: Sufficiently Active (12/16/2022)   Exercise Vital Sign    Days of Exercise per Week: 3 days    Minutes of Exercise per Session: 60 min  Stress: Stress Concern Present (12/16/2022)   [employer] of Occupational Health - Occupational Stress Questionnaire    Feeling of Stress : Very much  Social Connections: Socially Integrated (12/16/2022)   Social Connection and Isolation Panel    Frequency of Communication with Friends and Family: More than three times a week    Frequency of Social Gatherings with Friends and Family: Three times a week    Attends Religious Services: More than 4 times per year    Active Member of Clubs or Organizations: Yes    Attends Banker Meetings: More than 4 times per year    Marital Status: Living with partner  Depression (PHQ2-9): High Risk (06/02/2024)   Depression (PHQ2-9)    PHQ-2 Score: 14  Alcohol Screen: Low Risk (06/02/2024)   Alcohol Screen    Last Alcohol Screening Score (AUDIT): 1  Housing: Unknown (06/02/2024)   Epic    Unable to Pay for Housing in the Last Year: No    Number of Times Moved in the Last Year: Not on file    Homeless in the Last Year: No  Utilities: Not At Risk (12/16/2022)   AHC Utilities    Threatened with loss of utilities: No  Health Literacy: Not on file    Family History  Problem Relation Age of Onset   AAA (abdominal aortic aneurysm) Mother    Sudden death Mother    Alcoholism Mother    Dementia Father    Asthma Sister    Kidney cancer Sister        had kidney removed   Diabetes Sister    Lymphoma Nephew    Colon cancer Neg Hx     Outpatient Encounter Medications as of 08/15/2024  Medication Sig   aspirin EC 81 MG tablet Take  81 mg by mouth daily. Swallow whole.   atorvastatin (LIPITOR) 80 MG tablet Take 80 mg by mouth  in the morning.   bisacodyl (DULCOLAX) 5 MG EC tablet Take 5 mg by mouth daily as needed for moderate constipation.   busPIRone (BUSPAR) 10 MG tablet Take 10 mg by mouth 3 (three) times daily.   cholecalciferol (VITAMIN D3) 25 MCG (1000 UNIT) tablet Take 1,000 Units by mouth daily.   DULoxetine (CYMBALTA) 60 MG capsule Take 60 mg by mouth daily.   ELDERBERRY PO Take 1 tablet by mouth daily.   ezetimibe  (ZETIA ) 10 MG tablet Take 1 tablet (10 mg total) by mouth daily.   famotidine (PEPCID) 20 MG tablet Take 20 mg by mouth daily.   lubiprostone  (AMITIZA ) 24 MCG capsule Take 1 capsule (24 mcg total) by mouth 2 (two) times daily with a meal.   Magnesium  250 MG TABS Take 250 mg by mouth daily.   Magnesium  Oxide 250 MG TABS Take 1 tablet by mouth daily.   Multiple Vitamin (MULTIVITAMIN WITH MINERALS) TABS tablet Take 1 tablet by mouth in the morning.   Oxycodone  HCl 10 MG TABS Take 1 tablet by mouth 2 (two) times daily.   potassium chloride  SA (KLOR-CON  M) 20 MEQ tablet Take 1 tablet by mouth twice daily   pregabalin (LYRICA) 100 MG capsule Take 100 mg by mouth 2 (two) times daily.   pregabalin (LYRICA) 50 MG capsule Take 100 mg by mouth 2 (two) times daily.   RABEprazole  (ACIPHEX ) 20 MG tablet Take 20mg  30 minutes before breakfast and before supper twice daily.   traZODone (DESYREL) 50 MG tablet Take 75-100 mg by mouth at bedtime as needed for sleep.   triamcinolone  ointment (KENALOG ) 0.5 % Apply pea-sized amount to affected area twice daily as needed   UBRELVY 100 MG TABS Take 100 mg by mouth daily as needed (migraine).   vitamin C (ASCORBIC ACID) 500 MG tablet Take 500 mg by mouth in the morning.   [DISCONTINUED] Continuous Glucose Sensor (DEXCOM G7 SENSOR) MISC    [DISCONTINUED] Continuous Glucose Sensor (DEXCOM G7 SENSOR) MISC Inject 1 Application into the skin as directed. Change sensor every 10 days  as directed.   [DISCONTINUED] TRULICITY  0.75 MG/0.5ML SOAJ INJECT 0.75 MG INTO THE SKIN ONCE A WEEK   Continuous Glucose Sensor (DEXCOM G7 SENSOR) MISC Change sensor every 10 days as directed   Dulaglutide  (TRULICITY ) 1.5 MG/0.5ML SOAJ Inject 1.5 mg into the skin once a week.   ondansetron  (ZOFRAN ) 4 MG tablet Take 1 tablet (4 mg total) by mouth every 6 (six) hours as needed for nausea or vomiting.   [DISCONTINUED] Dulaglutide  (TRULICITY ) 1.5 MG/0.5ML SOAJ Inject 1.5 mg into the skin once a week. (Patient not taking: Reported on 08/15/2024)   No facility-administered encounter medications on file as of 08/15/2024.    ALLERGIES: No Known Allergies  VACCINATION STATUS:  There is no immunization history on file for this patient.  Diabetes She presents for her follow-up diabetic visit. She has type 2 diabetes mellitus. Onset time: diagnosed at approx age of 62. Her disease course has been improving. There are no hypoglycemic associated symptoms. Associated symptoms include fatigue. There are no hypoglycemic complications. Diabetic complications include heart disease. Risk factors for coronary artery disease include diabetes mellitus, dyslipidemia, family history and hypertension. Current diabetic treatments: Trulicity . Her weight is increasing steadily. She is following a generally unhealthy diet. When asked about meal planning, she reported none. She has not had a previous visit with a dietitian. She participates in exercise intermittently. Her home blood glucose trend is decreasing steadily. (She presents today with her  CGM showing at goal glycemic profile overall.  Her POCT A1c today is 7%, improving from last visit of 8%.  Analysis of her CGM shows TIR 91%, TAR 9%, TBR 0% with a GMI of 6.6%.  She notes she has foot surgery coming up, will be NWB for 8 weeks.  She also notes she has not been as consistent with healthy eating since last visit, still dealing with some family stressors.  She does have  some stomach issues with the Trulicity  but notes this was present before starting this medication.) An ACE inhibitor/angiotensin II receptor blocker is not being taken. She sees a podiatrist.Porter exam is current.     Review of systems  Constitutional: +minimally fluctuating body weight, current Body mass index is 45.46 kg/m., no fatigue, no subjective hyperthermia, no subjective hypothermia Eyes: no blurry vision, no xerophthalmia ENT: no sore throat, no nodules palpated in throat, no dysphagia/odynophagia, no hoarseness Cardiovascular: no chest pain, no shortness of breath, no palpitations, no leg swelling Respiratory: no cough, no shortness of breath Gastrointestinal: no nausea/vomiting/diarrhea, + reflux, + constipation- controlled on Linzess Musculoskeletal: + left foot pain- having surgery this week Skin: no rashes, no hyperemia Neurological: no tremors, + numbness/tingling to left great toe, no dizziness Psychiatric: no depression, no anxiety, + increased stress -family stressors  Objective:     BP 120/68 (BP Location: Right Arm, Patient Position: Sitting, Cuff Size: Large)   Pulse 65   Ht 5' 1 (1.549 m)   Wt 240 lb 9.6 oz (109.1 kg)   BMI 45.46 kg/m   Wt Readings from Last 3 Encounters:  08/15/24 240 lb 9.6 oz (109.1 kg)  06/27/24 237 lb 6.4 oz (107.7 kg)  06/02/24 243 lb (110.2 kg)     BP Readings from Last 3 Encounters:  08/15/24 120/68  06/27/24 135/85  06/02/24 119/79      Physical Exam- Limited  Constitutional:  Body mass index is 45.46 kg/m. , not in acute distress, normal state of mind Eyes:  EOMI, no exophthalmos Musculoskeletal: no gross deformities, strength intact in all four extremities, no gross restriction of joint movements Skin:  no rashes, no hyperemia Neurological: no tremor with outstretched hands   Diabetic Foot Exam - Simple   Simple Foot Form Visual Inspection No deformities, no ulcerations, no other skin breakdown bilaterally:  Yes Sensation Testing Intact to touch and monofilament testing bilaterally: Yes Pulse Check Posterior Tibialis and Dorsalis pulse intact bilaterally: Yes Comments Mild calluses bilaterally      CMP ( most recent) CMP     Component Value Date/Time   NA 137 04/28/2024 0931   NA 140 11/01/2023 1021   K 4.6 04/28/2024 0931   CL 103 04/28/2024 0931   CO2 21 (L) 04/28/2024 0931   GLUCOSE 121 (H) 04/28/2024 0931   BUN 27 (H) 04/28/2024 0931   BUN 16 11/01/2023 1021   CREATININE 0.77 04/28/2024 0931   CALCIUM 8.9 04/28/2024 0931   PROT 7.0 07/04/2024 0928   ALBUMIN 4.5 07/04/2024 0928   AST 24 07/04/2024 0928   ALT 32 07/04/2024 0928   ALKPHOS 147 (H) 07/04/2024 0928   BILITOT 0.6 07/04/2024 0928   EGFR 99.0 12/24/2023 0000   EGFR 69 11/01/2023 1021   GFRNONAA >60 04/28/2024 0931     Diabetic Labs (most recent): Lab Results  Component Value Date   HGBA1C 8.0 (A) 05/16/2024   HGBA1C 6.0 (H) 02/17/2023     Lipid Panel ( most recent) Lipid Panel     Component  Value Date/Time   CHOL 213 (H) 02/17/2023 0918   TRIG 104 02/17/2023 0918   HDL 83 02/17/2023 0918   LDLCALC 112 (H) 02/17/2023 0918   LABVLDL 18 02/17/2023 0918      Lab Results  Component Value Date   TSH 1.14 12/24/2023   TSH 2.180 10/12/2023   TSH 1.030 02/17/2023   FREET4 6.0 12/24/2023   FREET4 1.11 02/17/2023           Assessment & Plan:   1) Type 2 diabetes mellitus without complication, without long-term current use of insulin  (HCC) (Primary)  She presents today with her CGM showing at goal glycemic profile overall.  Her POCT A1c today is 7%, improving from last visit of 8%.  Analysis of her CGM shows TIR 91%, TAR 9%, TBR 0% with a GMI of 6.6%.  She notes she has foot surgery coming up, will be NWB for 8 weeks.  She also notes she has not been as consistent with healthy eating since last visit, still dealing with some family stressors.  She does have some stomach issues with the Trulicity   but notes this was present before starting this medication.  - Sarah Porter has currently uncontrolled symptomatic type 2 DM since 63 years of age.   -Recent labs reviewed.  - I had a long discussion with her about the progressive nature of diabetes and the pathology behind its complications. -her diabetes is complicated by CHF and she remains at a high risk for more acute and chronic complications which include CAD, CVA, CKD, retinopathy, and neuropathy. These are all discussed in detail with her.  The following Lifestyle Medicine recommendations according to American College of Lifestyle Medicine [facility]) were discussed and offered to patient and she agrees to start the journey:  A. Whole Foods, Plant-based plate comprising of fruits and vegetables, plant-based proteins, whole-grain carbohydrates was discussed in detail with the patient.   A list for source of those nutrients were also provided to the patient.  Patient will use only water  or unsweetened tea for hydration. B.  The need to stay away from risky substances including alcohol, smoking; obtaining 7 to 9 hours of restorative sleep, at least 150 minutes of moderate intensity exercise weekly, the importance of healthy social connections,  and stress reduction techniques were discussed. C.  A full color page of  Calorie density of various food groups per pound showing examples of each food groups was provided to the patient.  - Nutritional counseling repeated/built upon at each appointment.  - The patient admits there is a room for improvement in their diet and drink choices. -  Suggestion is made for the patient to avoid simple carbohydrates from their diet including Cakes, Sweet Desserts / Pastries, Ice Cream, Soda (diet and regular), Sweet Tea, Candies, Chips, Cookies, Sweet Pastries, Store Bought Juices, Alcohol in Excess of 1-2 drinks a day, Artificial Sweeteners, Coffee Creamer, and Sugar-free Products. This will help patient to have  stable blood glucose profile and potentially avoid unintended weight gain.   - I encouraged the patient to switch to unprocessed or minimally processed complex starch and increased protein intake (animal or plant source), fruits, and vegetables.   - Patient is advised to stick to a routine mealtimes to eat 3 meals a day and avoid unnecessary snacks (to snack only to correct hypoglycemia).  - I have approached her with the following individualized plan to manage her diabetes and patient agrees:   -She is advised to continue Trulicity  1.5 mg  SQ weekly.  She has tolerated this well with only minimal GI side effects (which were present prior to starting).  -She did try Metformin  in the past but stopped due to GI concerns.  -she can take a break from routine glucose monitoring at this time due to safe regimen.  - Adjustment parameters are given to her for hypo and hyperglycemia in writing.  - Specific targets for  A1c; LDL, HDL, and Triglycerides were discussed with the patient.  2) Blood Pressure /Hypertension:  her blood pressure is controlled to target.   she is advised to continue her current medications as prescribed by PCP.  3) Lipids/Hyperlipidemia:    Review of her recent lipid panel from 12/28/23 showed uncontrolled LDL at 106 .  she is advised to continue Zetia  10 mg daily at bedtime and Atorvastatin 80 mg po daily.  Side effects and precautions discussed with her.  She recently had labs with her PCP, will request copy for our records.  4)  Weight/Diet:  her Body mass index is 45.46 kg/m.  -  clearly complicating her diabetes care.   she is a candidate for weight loss. I discussed with her the fact that loss of 5 - 10% of her  current body weight will have the most impact on her diabetes management.  Exercise, and detailed carbohydrates information provided  -  detailed on discharge instructions.  5) Chronic Care/Health Maintenance: -she is not on ACEI/ARB and is on Statin  medications and is encouraged to initiate and continue to follow up with Ophthalmology, Dentist, Podiatrist at least yearly or according to recommendations, and advised to stay away from smoking. I have recommended yearly flu vaccine and pneumonia vaccine at least every 5 years; moderate intensity exercise for up to 150 minutes weekly; and sleep for at least 7 hours a day.  - she is advised to maintain close follow up with Ziomek, Paul, MD for primary care needs, as well as her other providers for optimal and coordinated care.  I did encourage her to try OTC Magnesium  Glycinate 1-2 tabs 1 hr before bed to help with nerve pain.    I spent  38  minutes in the care of the patient today including review of labs from CMP, Lipids, Thyroid  Function, Hematology (current and previous including abstractions from other facilities); face-to-face time discussing  her blood glucose readings/logs, discussing hypoglycemia and hyperglycemia episodes and symptoms, medications doses, her options of short and long term treatment based on the latest standards of care / guidelines;  discussion about incorporating lifestyle medicine;  and documenting the encounter. Risk reduction counseling performed per USPSTF guidelines to reduce obesity and cardiovascular risk factors.     Please refer to Patient Instructions for Blood Glucose Monitoring and Insulin /Medications Dosing Guide  in media tab for additional information. Please  also refer to  Patient Self Inventory in the Media  tab for reviewed elements of pertinent patient history.  Sarah Porter participated in the discussions, expressed understanding, and voiced agreement with the above plans.  All questions were answered to her satisfaction. she is encouraged to contact clinic should she have any questions or concerns prior to her return visit.     Follow up plan: - Return in about 4 months (around 12/14/2024) for Diabetes F/U with A1c in office, No previsit  labs.    Sarah Porter, [facility] [address] [place], [place] [address] Phone: [phone] Fax: [phone]  08/15/2024, 8:24 AM

## 2024-08-16 ENCOUNTER — Telehealth: Payer: Self-pay | Admitting: Gastroenterology

## 2024-08-16 NOTE — Telephone Encounter (Signed)
 Inbound call from patient stating she would like to be called if theres any sooner availability for her procedure that is scheduled for 11/15/24 in the hospital  Please advise  Thank you

## 2024-08-16 NOTE — Telephone Encounter (Signed)
 The pt has been advised that if manometry spot opens up she will be called

## 2024-08-18 NOTE — Progress Notes (Unsigned)
 "  NEUROLOGY FOLLOW UP OFFICE NOTE  [name] [id number]  Assessment/Plan:   Word-finding difficulty - I suspect secondary to multiple stressors over the past year.  However, given MOCA score and family history, further workup warranted.   Check TSH and B12 Check MRI of brain without contrast Neuropsychological evaluation Follow up after testing.  Total time spent in chart and face to face with patient: 40 minutes  Subjective:  Sarah Porter is a 63 year old female with ADD, HTN, DM II with neuropathy, HLD, GERD, chronic fatigue, CKD stage 2, anxiety and depression whom I previously saw for headache and dizziness presents today for concerns about memory.  Discussed the use of AI scribe software for clinical note transcription with the patient, who gave verbal consent to proceed.  History of Present Illness  She has been experiencing memory issues over the past few months, primarily characterized by forgetting what she is saying mid-sentence. These episodes occur a couple of times a week. No issues with getting lost, forgetting names, or performing everyday tasks, including taking her medication.  She is concerned about dementia due to her father's history of the condition, although she did not grow up with him and only learned of his condition in her thirties. Her mother passed away suddenly at the age of 65.  She has been under significant stress over the past year, including caring for her mother and her father-in-law, both who subsequently passed away, as well as her partner's recent emergency medical situation. She has been trying to manage her memory by taking notes and repeating things in her head.    PAST MEDICAL HISTORY: Past Medical History:  Diagnosis Date   Anemia    Anxiety    Back pain    Chest pain    CHF (congestive heart failure) (HCC)    Constipation    Depression    Essential hypertension    Fibromyalgia    GERD (gastroesophageal reflux disease)     Hyperlipidemia    Joint pain    Osteoarthritis    Palpitations    Sleep apnea    SOB (shortness of breath)    Swelling of lower extremity    Type 2 diabetes mellitus (HCC)    Vertigo     MEDICATIONS: Medications Ordered Prior to Encounter[1]  ALLERGIES: Allergies[2]  FAMILY HISTORY: Family History  Problem Relation Age of Onset   AAA (abdominal aortic aneurysm) Mother    Sudden death Mother    Alcoholism Mother    Dementia Father    Asthma Sister    Kidney cancer Sister        had kidney removed   Diabetes Sister    Lymphoma Nephew    Colon cancer Neg Hx       Objective:  Blood pressure 127/80, pulse 67, height 5' 1 (1.549 m), weight 243 lb (110.2 kg). General: No acute distress.  Patient appears well-groomed.   Head:  Normocephalic/atraumatic Eyes:  Fundi examined but not visualized Neck: supple, no paraspinal tenderness, full range of motion Heart:  Regular rate and rhythm Neurological Exam: alert and oriented.  Speech fluent and not dysarthric, language intact.      08/21/2024    3:00 PM  Montreal Cognitive Assessment   Visuospatial/ Executive (0/5) 4  Naming (0/3) 3  Attention: Read list of digits (0/2) 2  Attention: Read list of letters (0/1) 1  Attention: Serial 7 subtraction starting at 100 (0/3) 2  Language: Repeat phrase (0/2) 2  Language :  Fluency (0/1) 1  Abstraction (0/2) 2  Delayed Recall (0/5) 0  Orientation (0/6) 6  Total 23  Adjusted Score (based on education) 24   CN II-XII intact. Bulk and tone normal, muscle strength 5/5 throughout.  Sensation to light touch intact.  Deep tendon reflexes 2+ throughout, toes downgoing.  Finger to nose testing intact.  Gait normal, Romberg negative.   Juliene Dunnings, DO  CC: Paul Ziomek, MD          [1]  Current Outpatient Medications on File Prior to Visit  Medication Sig Dispense Refill   aspirin EC 81 MG tablet Take 81 mg by mouth daily. Swallow whole.     atorvastatin (LIPITOR) 80 MG tablet  Take 80 mg by mouth in the morning.     bisacodyl (DULCOLAX) 5 MG EC tablet Take 5 mg by mouth daily as needed for moderate constipation.     busPIRone (BUSPAR) 10 MG tablet Take 10 mg by mouth 3 (three) times daily.     cholecalciferol (VITAMIN D3) 25 MCG (1000 UNIT) tablet Take 1,000 Units by mouth daily.     Continuous Glucose Sensor (DEXCOM G7 SENSOR) MISC Change sensor every 10 days as directed 9 each 3   Dulaglutide  (TRULICITY ) 1.5 MG/0.5ML SOAJ Inject 1.5 mg into the skin once a week. 6 mL 1   DULoxetine (CYMBALTA) 60 MG capsule Take 60 mg by mouth daily.     ELDERBERRY PO Take 1 tablet by mouth daily.     ezetimibe  (ZETIA ) 10 MG tablet Take 1 tablet (10 mg total) by mouth daily. 90 tablet 2   famotidine (PEPCID) 20 MG tablet Take 20 mg by mouth daily.     lubiprostone  (AMITIZA ) 24 MCG capsule Take 1 capsule (24 mcg total) by mouth 2 (two) times daily with a meal. 60 capsule 5   Magnesium  250 MG TABS Take 250 mg by mouth daily.     Magnesium  Oxide 250 MG TABS Take 1 tablet by mouth daily.     Multiple Vitamin (MULTIVITAMIN WITH MINERALS) TABS tablet Take 1 tablet by mouth in the morning.     ondansetron  (ZOFRAN ) 4 MG tablet Take 1 tablet (4 mg total) by mouth every 6 (six) hours as needed for nausea or vomiting. 90 tablet 3   Oxycodone  HCl 10 MG TABS Take 1 tablet by mouth 2 (two) times daily.     potassium chloride  SA (KLOR-CON  M) 20 MEQ tablet Take 1 tablet by mouth twice daily 180 tablet 3   pregabalin (LYRICA) 100 MG capsule Take 100 mg by mouth 2 (two) times daily.     pregabalin (LYRICA) 50 MG capsule Take 100 mg by mouth 2 (two) times daily.     RABEprazole  (ACIPHEX ) 20 MG tablet Take 20mg  30 minutes before breakfast and before supper twice daily. 180 tablet 1   traZODone (DESYREL) 50 MG tablet Take 75-100 mg by mouth at bedtime as needed for sleep.     triamcinolone  ointment (KENALOG ) 0.5 % Apply pea-sized amount to affected area twice daily as needed 30 g 1   UBRELVY 100 MG TABS  Take 100 mg by mouth daily as needed (migraine).     vitamin C (ASCORBIC ACID) 500 MG tablet Take 500 mg by mouth in the morning.     No current facility-administered medications on file prior to visit.  [2] No Known Allergies  "

## 2024-08-21 ENCOUNTER — Encounter: Payer: Self-pay | Admitting: Neurology

## 2024-08-21 ENCOUNTER — Other Ambulatory Visit

## 2024-08-21 ENCOUNTER — Ambulatory Visit: Admitting: Neurology

## 2024-08-21 VITALS — BP 127/80 | HR 67 | Ht 61.0 in | Wt 243.0 lb

## 2024-08-21 DIAGNOSIS — R4789 Other speech disturbances: Secondary | ICD-10-CM

## 2024-08-21 NOTE — Patient Instructions (Addendum)
 Check B12 and TSH. Your provider has requested that you have labwork completed today. Please go to [facility] ([id number]) on the second floor of this building before leaving the office today. You do not need to check in. If you are not called within 15 minutes please check with the front desk.   Check MRI of brain without contrast. We have sent a referral to [facility] for your MRI and they will call you directly to schedule your appointment. They are located at [address] [facility]. If you need to contact them directly please call [phone].  Neuropsychological evaluation (to be scheduled for after MRI) Follow up after testing

## 2024-08-22 ENCOUNTER — Ambulatory Visit: Payer: Self-pay | Admitting: Neurology

## 2024-08-22 DIAGNOSIS — Z79899 Other long term (current) drug therapy: Secondary | ICD-10-CM

## 2024-08-22 LAB — TSH: TSH: 0.87 m[IU]/L (ref 0.40–4.50)

## 2024-08-22 LAB — VITAMIN B12: Vitamin B-12: 240 pg/mL (ref 200–1100)

## 2024-08-22 NOTE — Telephone Encounter (Signed)
 Patient advised.

## 2024-08-22 NOTE — Telephone Encounter (Signed)
-----   Message from Juliene Dunnings, DO sent at 08/22/2024  7:08 AM EST ----- B12 is low, which may contribute to memory and word-finding deficits.  I would recommend starting OTC B12 1000mcg daily.  Recheck level about a week prior to follow up in May.  I would actually hold  off/cancel neuropsychological evaluation for now and see if she improves with B12 supplementation.  If there is no change by her follow up, then proceed with ordering neuropsychological evaluation.   Still proceed with MRI of brain.

## 2024-08-23 ENCOUNTER — Encounter

## 2024-08-28 ENCOUNTER — Encounter: Admitting: Podiatry

## 2024-08-28 ENCOUNTER — Telehealth: Payer: Self-pay

## 2024-08-28 NOTE — Telephone Encounter (Signed)
 Pt called stating that she has been doubling up on the ondansetron  4 mg and is wanting to know if it is ok for her to continue doing that. I explained to the pt that you are out of the office and won't return till Wednesday, pt was ok with waiting to hear back on Wednesday. I also routed this to the app on call incase it was something that needed to be addressed before then.

## 2024-08-28 NOTE — Telephone Encounter (Signed)
 She can take 8 mg of Zofran  every 8 hours but would not exceed this. Further recommendations per Sonny when returns.

## 2024-08-29 NOTE — Telephone Encounter (Signed)
 Pt was made aware and verbalized understanding.

## 2024-08-30 NOTE — Telephone Encounter (Signed)
 Agree with Sarah Porter's input. She has not been seen in a couple of months and pending manometry for dysphagia (10/2024). We should probably go ahead and get her back in to be seen if her nausea is worse.

## 2024-09-01 NOTE — Telephone Encounter (Signed)
 Pt was made aware and verbalized understanding. Pt has been scheduled for 09/25/24.   Ladonna: please add pt to wait list for a sooner appt.

## 2024-09-06 ENCOUNTER — Encounter

## 2024-09-09 ENCOUNTER — Other Ambulatory Visit

## 2024-09-13 ENCOUNTER — Telehealth: Payer: Self-pay | Admitting: Nurse Practitioner

## 2024-09-13 NOTE — Telephone Encounter (Signed)
 What insurance does she have?  If it is commercial, she could probably use a copay card that will help cover the cost of the medication (which can be downloaded online).  Otherwise, yes, she can stop the Trulicity  but we will likely need to start her on something else to help control her glucose.

## 2024-09-13 NOTE — Telephone Encounter (Signed)
 Patient wants to know if she can stop taking her Trulicity  because she has a high deductible and its going to be $1000

## 2024-09-14 MED ORDER — OZEMPIC (0.25 OR 0.5 MG/DOSE) 2 MG/3ML ~~LOC~~ SOPN: 0.5000 mg | PEN_INJECTOR | SUBCUTANEOUS | 3 refills | Status: AC

## 2024-09-14 NOTE — Addendum Note (Signed)
 Addended by: Kalese Ensz J on: 09/14/2024 07:50 AM   Modules accepted: Orders

## 2024-09-14 NOTE — Telephone Encounter (Signed)
 Yes we can switch to that if it is more affordable.  I will send in for Ozempic  0.5 mg weekly.  The pen will look different than what she is used to, it is multi-use rather than single use, so don't throw it away after 1 use.  :)  I sent the script to her pharmacy on file.

## 2024-09-19 ENCOUNTER — Institutional Professional Consult (permissible substitution): Payer: Self-pay | Admitting: Psychology

## 2024-09-19 ENCOUNTER — Ambulatory Visit: Payer: Self-pay

## 2024-09-20 ENCOUNTER — Encounter: Admitting: Podiatry

## 2024-09-20 ENCOUNTER — Encounter

## 2024-09-22 ENCOUNTER — Telehealth: Payer: Self-pay | Admitting: Gastroenterology

## 2024-09-22 NOTE — Telephone Encounter (Signed)
 I called patient to let her know our office is closed now on Monday.  She wanted to know the next available date and I told her it would be February.  She said she waited a whole month for the appt on 1/26, was this the soonest we had and I replied yes but I could add to the wait list.  She stated she has been a patient here for a long time and she said just tell the nurse to call me.

## 2024-09-22 NOTE — Telephone Encounter (Signed)
 Patient is upset because we cancelled her appointment for 1/26 because of the weather. Pt states  she will be going somewhere else.

## 2024-09-22 NOTE — Telephone Encounter (Signed)
 I understand she has waiting for a month to be seen, per other telephone note.   I would be more than happy to see her in one of my urgent spots to expedite her if she would like.

## 2024-09-25 ENCOUNTER — Ambulatory Visit: Admitting: Gastroenterology

## 2024-09-26 NOTE — Telephone Encounter (Signed)
 See other note regarding the ability to use an urgent slot per LSL

## 2024-09-27 ENCOUNTER — Encounter: Payer: Self-pay | Admitting: Psychology

## 2024-09-29 ENCOUNTER — Other Ambulatory Visit (HOSPITAL_COMMUNITY): Payer: Self-pay

## 2024-09-29 ENCOUNTER — Telehealth: Payer: Self-pay | Admitting: Pharmacy Technician

## 2024-09-29 NOTE — Telephone Encounter (Signed)
 Pharmacy Patient Advocate Encounter   Received notification from [facility] KEY that prior authorization for Ozempic  (0.25 or 0.5 MG/DOSE) 2MG /3ML pen-injectors  is required/requested.   Insurance verification completed.   The patient is insured through [employer] [facility].   Per test claim: PA required; PA submitted to above mentioned insurance via Latent Key/confirmation #/EOC [id number] Status is pending

## 2024-10-02 ENCOUNTER — Other Ambulatory Visit (HOSPITAL_COMMUNITY): Payer: Self-pay

## 2024-10-02 ENCOUNTER — Encounter: Payer: Self-pay | Admitting: Gastroenterology

## 2024-10-04 ENCOUNTER — Ambulatory Visit: Payer: Self-pay | Admitting: Gastroenterology

## 2024-10-04 ENCOUNTER — Encounter: Payer: Self-pay | Admitting: Gastroenterology

## 2024-10-04 VITALS — BP 139/89 | HR 67 | Temp 97.8°F | Ht 61.0 in | Wt 235.8 lb

## 2024-10-04 DIAGNOSIS — K219 Gastro-esophageal reflux disease without esophagitis: Secondary | ICD-10-CM

## 2024-10-04 DIAGNOSIS — R110 Nausea: Secondary | ICD-10-CM | POA: Insufficient documentation

## 2024-10-04 DIAGNOSIS — R140 Abdominal distension (gaseous): Secondary | ICD-10-CM | POA: Insufficient documentation

## 2024-10-04 DIAGNOSIS — K5900 Constipation, unspecified: Secondary | ICD-10-CM

## 2024-10-04 DIAGNOSIS — R7989 Other specified abnormal findings of blood chemistry: Secondary | ICD-10-CM

## 2024-10-04 MED ORDER — ONDANSETRON 4 MG PO TBDP: 4.0000 mg | ORAL_TABLET | ORAL | 3 refills | Status: AC | PRN

## 2024-10-04 NOTE — Progress Notes (Unsigned)
 "    GI Office Note    Referring Provider: Cristine Mt, MD Primary Care Physician:  Cristine Mt, MD  Primary Gastroenterologist:  Chief Complaint   Chief Complaint  Patient presents with   Nausea    Still having issues with nausea, feeling full fast and bloating.    History of Present Illness   Sarah Porter is a 64 y.o. female presenting today       Discussed the use of AI scribe software for clinical note transcription with the patient, who gave verbal consent to proceed.  History of Present Illness Sarah Porter is a 64 year old female with type 2 diabetes and chronic gastrointestinal symptoms who presents for evaluation of persistent bloating, nausea, regurgitation, and refractory weight gain.  For several months she has had persistent upper abdominal bloating and visible distension after almost any oral intake. Even very small meals (for example half a cup of oatmeal) cause extreme fullness that lasts the rest of the day. The upper abdomen feels hard and distended after eating. Liquids and soft foods are somewhat easier to take but still cause bloating and regurgitation. Spacing small meals through the day has not helped. Symptom journaling since August 31, 2024, shows daily symptoms through February. Over the past 4 days she has stayed in bed most of the time because she feels so unwell and is unsure what she can safely eat.  She has daily nausea, frequent regurgitation described as bile coming up, and vomiting after eating. Coffee and applesauce reliably trigger nausea and vomiting. Regurgitation episodes are associated with chest pain. Symptoms improve temporarily with deep swallows of water  and Tums. She uses dissolvable Zofran  almost daily, usually once a day as prevention, which works better than tablets.  Despite very limited intake and attempts at healthy eating and intermittent fasting, she has gained weight over the past year. She has tried multiple structured  weight-loss programs and regular exercise without success. Trulicity  was stopped around April 2025, and she has been on Ozempic  for about one month, but similar gastrointestinal symptoms occurred even when she was off these medications. She is distressed about weight gain that she feels is out of proportion to her intake and reports joint pain, fatigue, and exertional dyspnea.  She has chronic constipation treated with daily Linzess, which causes a rapid-onset washout effect and requires ready access to a toilet. She has cramping and bloating before bowel movements with partial relief afterward. Amitiza  is available but not taken with Linzess. She previously had a cholecystectomy with a reportedly normal gallbladder.  Current intake is mainly water , a few sips of coffee, and occasional grape juice. She is no longer using oral nutritional supplements such as Ensure. She feels cold at times and has diffuse joint aches but no fever.  She has chronic pain treated with intermittent nighttime oxycodone . She is being evaluated by neurology for memory loss and started B12 supplements after low B12 was found in December, with repeat labs and MRI planned. Caregiving demands for her hospitalized partner have limited her ability to maintain dietary and exercise routines and contribute to significant emotional distress about her health and her husband's illness.   Wt Readings from Last 10 Encounters:  10/04/24 235 lb 12.8 oz (107 kg)  08/21/24 243 lb (110.2 kg)  08/15/24 240 lb 9.6 oz (109.1 kg)  06/27/24 237 lb 6.4 oz (107.7 kg)  06/02/24 243 lb (110.2 kg)  05/17/24 241 lb 12.8 oz (109.7 kg)  05/16/24 241 lb 9.6 oz (109.6  kg)  05/03/24 233 lb 11 oz (106 kg)  04/28/24 233 lb 11 oz (106 kg)  02/04/24 233 lb 9.6 oz (106 kg)     Prior Data     Results    EGD 05/2024 with small hh, gastritis (neg h.pylori), esophageal dilation, and biopsies from middle third of esophagus taken, c/w reflux. Advised PPI  BID.    Prior EGD in November 2024 that showed mild reflux esophagitis, short segment Barrett's esophagus and gastritis with biopsies negative for H. pylori.    CT A/P with contrast 10/2023 showed nonobstructing left nephrolithiasis.    Labs in March 2025: alk phos 127, ALT 35. Other LFTs, GGT, lipase within normal limits.    HIDA March 2025 with mild biliary hyperkinesia.    Cholecystectomy 12/03/23.  No significant abnormalities on path.   GES 12/2023 normal.   Medications   Current Outpatient Medications  Medication Sig Dispense Refill   ARIPiprazole (ABILIFY) 2 MG tablet Take 2 mg by mouth daily.     aspirin EC 81 MG tablet Take 81 mg by mouth daily. Swallow whole.     atorvastatin (LIPITOR) 80 MG tablet Take 80 mg by mouth in the morning.     bisacodyl (DULCOLAX) 5 MG EC tablet Take 5 mg by mouth daily as needed for moderate constipation.     busPIRone (BUSPAR) 10 MG tablet Take 10 mg by mouth 3 (three) times daily.     cholecalciferol (VITAMIN D3) 25 MCG (1000 UNIT) tablet Take 1,000 Units by mouth daily.     Continuous Glucose Sensor (DEXCOM G7 SENSOR) MISC Change sensor every 10 days as directed 9 each 3   DENTA 5000 PLUS 1.1 % CREA dental cream Place 1 Application onto teeth at bedtime.     DULoxetine (CYMBALTA) 60 MG capsule Take 60 mg by mouth daily.     ELDERBERRY PO Take 1 tablet by mouth daily.     ezetimibe  (ZETIA ) 10 MG tablet Take 1 tablet (10 mg total) by mouth daily. 90 tablet 2   famotidine (PEPCID) 20 MG tablet Take 20 mg by mouth daily.     furosemide  (LASIX ) 40 MG tablet Take 40 mg by mouth daily.     linaclotide (LINZESS) 145 MCG CAPS capsule Take 145 mcg by mouth daily before breakfast.     lubiprostone  (AMITIZA ) 24 MCG capsule Take 1 capsule (24 mcg total) by mouth 2 (two) times daily with a meal. 60 capsule 5   Magnesium  250 MG TABS Take 250 mg by mouth daily.     Magnesium  Oxide 250 MG TABS Take 1 tablet by mouth daily.     Multiple Vitamin (MULTIVITAMIN  WITH MINERALS) TABS tablet Take 1 tablet by mouth in the morning.     ondansetron  (ZOFRAN ) 4 MG tablet Take 1 tablet (4 mg total) by mouth every 6 (six) hours as needed for nausea or vomiting. 90 tablet 3   Oxycodone  HCl 10 MG TABS Take 1 tablet by mouth 2 (two) times daily.     potassium chloride  SA (KLOR-CON  M) 20 MEQ tablet Take 1 tablet by mouth twice daily 180 tablet 3   pregabalin (LYRICA) 100 MG capsule Take 100 mg by mouth 2 (two) times daily.     RABEprazole  (ACIPHEX ) 20 MG tablet Take 20mg  30 minutes before breakfast and before supper twice daily. 180 tablet 1   Semaglutide ,0.25 or 0.5MG /DOS, (OZEMPIC , 0.25 OR 0.5 MG/DOSE,) 2 MG/3ML SOPN Inject 0.5 mg into the skin once a week. 3 mL 3  triamcinolone  ointment (KENALOG ) 0.5 % Apply pea-sized amount to affected area twice daily as needed 30 g 1   valACYclovir (VALTREX) 500 MG tablet Take 500 mg by mouth daily.     vitamin C (ASCORBIC ACID) 500 MG tablet Take 500 mg by mouth in the morning.     vitamin E 45 MG (100 UNITS) capsule Take 100 Units by mouth daily.     No current facility-administered medications for this visit.    Allergies   Allergies as of 10/04/2024   (No Known Allergies)     Past Medical History   Past Medical History:  Diagnosis Date   Anemia    Anxiety    Back pain    Chest pain    CHF (congestive heart failure) (HCC)    Constipation    Depression    Essential hypertension    Fibromyalgia    GERD (gastroesophageal reflux disease)    Hyperlipidemia    Joint pain    Osteoarthritis    Palpitations    Sleep apnea    SOB (shortness of breath)    Swelling of lower extremity    Type 2 diabetes mellitus (HCC)    Vertigo     Past Surgical History   Past Surgical History:  Procedure Laterality Date   BILATERAL KNEE REPAIRS     BIOPSY  07/05/2023   Procedure: BIOPSY;  Surgeon: Cindie Carlin POUR, DO;  Location: [facility];  Service: Endoscopy;;   CESAREAN SECTION     x 2   COLONOSCOPY  11/04/2011    Dr. Fairy Glee; diminutive colon polyp in the rectum, otherwise normal exam.  Pathology with tubular adenoma.  Recommended 5-year repeat.   COLONOSCOPY  07/12/2014   Dr. Fairy Glee; submucosal hemorrhages in proximal descending colon?  Prior ischemic or infectious colitis?,  Otherwise normal exam.  Recommended 5-year repeat.   COLONOSCOPY WITH PROPOFOL  N/A 07/22/2021   Procedure: COLONOSCOPY WITH PROPOFOL ;  Surgeon: Cindie Carlin POUR, DO;  Location: [facility];  Service: Endoscopy;  Laterality: N/A;  8:45am   ESOPHAGEAL DILATION N/A 05/03/2024   Procedure: DILATION, ESOPHAGUS;  Surgeon: Cindie Carlin POUR, DO;  Location: [facility];  Service: Endoscopy;  Laterality: N/A;   ESOPHAGOGASTRODUODENOSCOPY  07/21/2004   Dr. Fairy Glee; grade 2 erosive esophagitis at EG junction, cervical inlet patch, otherwise normal exam.   ESOPHAGOGASTRODUODENOSCOPY N/A 05/03/2024   Procedure: EGD (ESOPHAGOGASTRODUODENOSCOPY);  Surgeon: Cindie Carlin POUR, DO;  Location: [facility];  Service: Endoscopy;  Laterality: N/A;  730AM, ASA 3   ESOPHAGOGASTRODUODENOSCOPY (EGD) WITH PROPOFOL  N/A 07/05/2023   Procedure: ESOPHAGOGASTRODUODENOSCOPY (EGD) WITH PROPOFOL ;  Surgeon: Cindie Carlin POUR, DO;  Location: [facility];  Service: Endoscopy;  Laterality: N/A;  11:15 am, asa 3    Past Family History   Family History  Problem Relation Age of Onset   AAA (abdominal aortic aneurysm) Mother    Sudden death Mother    Alcoholism Mother    Dementia Father    Asthma Sister    Kidney cancer Sister        had kidney removed   Diabetes Sister    Lymphoma Nephew    Colon cancer Neg Hx     Past Social History   Social History   Socioeconomic History   Marital status: Significant Other    Spouse name: Not on file   Number of children: Not on file   Years of education: Not on file   Highest education level: Not on file  Occupational History   Not on file  Tobacco Use   Smoking status: Former    Current  packs/day: 0.00    Types: Cigarettes    Quit date: 09/01/1999    Years since quitting: 25.1    Passive exposure: Never   Smokeless tobacco: Never   Tobacco comments:    smoked for approx 20 years   Vaping Use   Vaping status: Never Used  Substance and Sexual Activity   Alcohol use: Not Currently   Drug use: Yes    Frequency: 2.0 times per week    Types: Marijuana    Comment: occasionally-cbd   Sexual activity: Yes    Birth control/protection: Post-menopausal  Other Topics Concern   Not on file  Social History Narrative   Are you right handed or left handed? Right   Are you currently employed ? Retired   What is your current occupation?   Do you live at home alone? NO    Who lives with you?    What type of home do you live in: 1 story or 2 story? 1       Social Drivers of Health   Tobacco Use: Medium Risk (10/04/2024)   Patient History    Smoking Tobacco Use: Former    Smokeless Tobacco Use: Never    Passive Exposure: Never  Physicist, Medical Strain: Low Risk (12/16/2022)   Overall Financial Resource Strain (CARDIA)    Difficulty of Paying Living Expenses: Not very hard  Food Insecurity: No Food Insecurity (06/02/2024)   Epic    Worried About Programme Researcher, Broadcasting/film/video in the Last Year: Never true    Ran Out of Food in the Last Year: Never true  Transportation Needs: No Transportation Needs (06/02/2024)   Epic    Lack of Transportation (Medical): No    Lack of Transportation (Non-Medical): No  Physical Activity: Sufficiently Active (12/16/2022)   Exercise Vital Sign    Days of Exercise per Week: 3 days    Minutes of Exercise per Session: 60 min  Stress: Stress Concern Present (12/16/2022)   [employer] of Occupational Health - Occupational Stress Questionnaire    Feeling of Stress : Very much  Social Connections: Socially Integrated (12/16/2022)   Social Connection and Isolation Panel    Frequency of Communication with Friends and Family: More than three times a week     Frequency of Social Gatherings with Friends and Family: Three times a week    Attends Religious Services: More than 4 times per year    Active Member of Clubs or Organizations: Yes    Attends Banker Meetings: More than 4 times per year    Marital Status: Living with partner  Intimate Partner Violence: Not At Risk (06/02/2024)   Epic    Fear of Current or Ex-Partner: No    Emotionally Abused: No    Physically Abused: No    Sexually Abused: No  Depression (PHQ2-9): High Risk (06/02/2024)   Depression (PHQ2-9)    PHQ-2 Score: 14  Alcohol Screen: Low Risk (06/02/2024)   Alcohol Screen    Last Alcohol Screening Score (AUDIT): 1  Housing: Unknown (06/02/2024)   Epic    Unable to Pay for Housing in the Last Year: No    Number of Times Moved in the Last Year: Not on file    Homeless in the Last Year: No  Utilities: Not At Risk (12/16/2022)   [facility] Utilities    Threatened with loss of utilities: No  Health Literacy: Not on file    Review of Systems   General: Negative for anorexia, weight loss, fever, chills, +fatigue, weakness. ENT: Negative for hoarseness, difficulty swallowing , nasal congestion. CV: Negative for chest pain, angina, palpitations, dyspnea on exertion, peripheral edema.  Respiratory: Negative for dyspnea at rest, dyspnea on exertion, cough, sputum, wheezing.  GI: See history of present illness. GU:  Negative for dysuria, hematuria, urinary incontinence, urinary frequency, nocturnal urination.  Endo: see hpi    Physical Exam   BP 139/89   Pulse 67   Temp 97.8 F (36.6 C) (Oral)   Ht 5' 1 (1.549 m)   Wt 235 lb 12.8 oz (107 kg)   SpO2 97%   BMI 44.55 kg/m    General: Well-nourished, well-developed in no acute distress. tearful Eyes: No icterus. Mouth: Oropharyngeal mucosa moist and pink   Lungs: Clear to auscultation bilaterally.  Heart: Regular rate and rhythm, no murmurs rubs or gallops.  Abdomen: Bowel sounds are normal, nontender, nondistended,  no hepatosplenomegaly or masses,  no abdominal bruits or hernia , no rebound or guarding. Exam limited by body habitus.  Rectal: not performed Extremities: No lower extremity edema. No clubbing or deformities. Neuro: Alert and oriented x 4   Skin: Warm and dry, no jaundice.   Psych: Alert and cooperative, normal mood and affect.  Labs   See above  Imaging Studies   No results found.  Assessment/Plan:   Assessment and Plan Assessment & Plan Chronic nausea, vomiting, bloating, and abnormal weight gain Severe, persistent symptoms with unclear etiology, impacting quality of life. Evaluating metabolic or endocrine contributors, including hypercortisolism. - Ordered fasting morning cortisol to assess for hypercortisolism or adrenal pathology. - Ordered comprehensive laboratory panel including liver function tests and platelet count. - Prescribed dissolvable ondansetron  (Zofran ) with increased quantity. - Instructed her to remain NPO after midnight and present for laboratory testing between 8 and 10 AM. - Advised continued journaling of symptoms and dietary intake. - Will follow up on laboratory results and determine next steps based on findings.  Gastroesophageal reflux disease (GERD) Persistent daily symptoms despite current therapy. - Continue current GERD management regimen. - Encouraged use of Tums and water  for acute symptom relief as needed.  Chronic constipation Longstanding constipation exacerbated by opioid use. Linzess effective but requires proximity to a bathroom; avoid concurrent use with Amitiza . - Instructed her to take Linzess daily. - Advised against concurrent use of Linzess and Amitiza . - Clarified proper use and dosing of Linzess and Amitiza .  Dysphagia Ongoing symptoms with further evaluation pending via swallow study. - Continue to await results of swallow study at [facility].  Abnormal liver function tests Elevated liver function tests require ongoing  monitoring. - Ordered repeat liver function tests as part of comprehensive laboratory evaluation.  Recording duration: 25 minutes  Amitiaza OR LInzess Opiod prn kist at mogjt     CBC, CMET, fasting cortisol level. Ap isoenzymes.  Zofran  odt    Sonny RAMAN. Ezzard, MHS, PA-C [facility]  "

## 2024-10-04 NOTE — Patient Instructions (Signed)
 Complete labs between 8-10am, nothing to eat or drink after midnight.  RX for dissolvable zofran  sent to your pharmacy.   Make sure you either take Linzess OR lubiprostone . Do not take both during the same day. Best to pick one and be consistent.

## 2024-10-05 ENCOUNTER — Ambulatory Visit: Payer: Self-pay | Admitting: Cardiology

## 2024-10-06 ENCOUNTER — Other Ambulatory Visit (HOSPITAL_COMMUNITY): Admission: RE | Admit: 2024-10-06 | Source: Ambulatory Visit | Admitting: Gastroenterology

## 2024-10-06 ENCOUNTER — Ambulatory Visit: Payer: Self-pay | Admitting: Gastroenterology

## 2024-10-06 LAB — CBC WITH DIFFERENTIAL/PLATELET
Abs Immature Granulocytes: 0.03 10*3/uL (ref 0.00–0.07)
Basophils Absolute: 0.1 10*3/uL (ref 0.0–0.1)
Basophils Relative: 1 %
Eosinophils Absolute: 0.3 10*3/uL (ref 0.0–0.5)
Eosinophils Relative: 5 %
HCT: 44.1 % (ref 36.0–46.0)
Hemoglobin: 14 g/dL (ref 12.0–15.0)
Immature Granulocytes: 1 %
Lymphocytes Relative: 34 %
Lymphs Abs: 2.2 10*3/uL (ref 0.7–4.0)
MCH: 27.7 pg (ref 26.0–34.0)
MCHC: 31.7 g/dL (ref 30.0–36.0)
MCV: 87.2 fL (ref 80.0–100.0)
Monocytes Absolute: 0.7 10*3/uL (ref 0.1–1.0)
Monocytes Relative: 11 %
Neutro Abs: 3.2 10*3/uL (ref 1.7–7.7)
Neutrophils Relative %: 48 %
Platelets: 374 10*3/uL (ref 150–400)
RBC: 5.06 MIL/uL (ref 3.87–5.11)
RDW: 14.1 % (ref 11.5–15.5)
WBC: 6.4 10*3/uL (ref 4.0–10.5)
nRBC: 0 % (ref 0.0–0.2)

## 2024-10-06 LAB — COMPREHENSIVE METABOLIC PANEL WITH GFR
ALT: 23 U/L (ref 0–44)
AST: 22 U/L (ref 15–41)
Albumin: 4.3 g/dL (ref 3.5–5.0)
Alkaline Phosphatase: 119 U/L (ref 38–126)
Anion gap: 13 (ref 5–15)
BUN: 15 mg/dL (ref 8–23)
CO2: 27 mmol/L (ref 22–32)
Calcium: 9.3 mg/dL (ref 8.9–10.3)
Chloride: 102 mmol/L (ref 98–111)
Creatinine, Ser: 0.76 mg/dL (ref 0.44–1.00)
GFR, Estimated: >60 mL/min
Glucose, Bld: 120 mg/dL — ABNORMAL HIGH (ref 70–99)
Potassium: 4.3 mmol/L (ref 3.5–5.1)
Sodium: 142 mmol/L (ref 135–145)
Total Bilirubin: 0.4 mg/dL (ref 0.0–1.2)
Total Protein: 7.1 g/dL (ref 6.5–8.1)

## 2024-10-06 LAB — CORTISOL-AM, BLOOD: Cortisol - AM: 13.4 ug/dL (ref 6.7–22.6)

## 2024-11-15 ENCOUNTER — Ambulatory Visit (HOSPITAL_COMMUNITY): Admit: 2024-11-15 | Payer: Self-pay | Admitting: Gastroenterology

## 2024-11-15 ENCOUNTER — Encounter (HOSPITAL_COMMUNITY): Payer: Self-pay

## 2024-11-15 SURGERY — MANOMETRY, ESOPHAGUS
Anesthesia: Choice

## 2024-12-14 ENCOUNTER — Ambulatory Visit: Admitting: Nurse Practitioner

## 2025-01-03 ENCOUNTER — Ambulatory Visit: Payer: Self-pay | Admitting: Neurology
